# Patient Record
Sex: Male | Born: 1960 | Race: Black or African American | Hispanic: No | Marital: Single | Smoking: Current every day smoker
Health system: Southern US, Community
[De-identification: ages and names within clinical notes are randomized; demographics above are authoritative.]

## PROBLEM LIST (undated history)

## (undated) DIAGNOSIS — E785 Hyperlipidemia, unspecified: Secondary | ICD-10-CM

## (undated) DIAGNOSIS — I509 Heart failure, unspecified: Secondary | ICD-10-CM

## (undated) DIAGNOSIS — Z720 Tobacco use: Secondary | ICD-10-CM

## (undated) DIAGNOSIS — G4733 Obstructive sleep apnea (adult) (pediatric): Secondary | ICD-10-CM

## (undated) DIAGNOSIS — Z95810 Presence of automatic (implantable) cardiac defibrillator: Secondary | ICD-10-CM

## (undated) DIAGNOSIS — E669 Obesity, unspecified: Secondary | ICD-10-CM

## (undated) DIAGNOSIS — I340 Nonrheumatic mitral (valve) insufficiency: Secondary | ICD-10-CM

## (undated) DIAGNOSIS — I428 Other cardiomyopathies: Secondary | ICD-10-CM

## (undated) DIAGNOSIS — I10 Essential (primary) hypertension: Secondary | ICD-10-CM

## (undated) HISTORY — DX: Presence of automatic (implantable) cardiac defibrillator: Z95.810

## (undated) HISTORY — DX: Other cardiomyopathies: I42.8

## (undated) HISTORY — DX: Essential (primary) hypertension: I10

## (undated) HISTORY — DX: Obesity, unspecified: E66.9

## (undated) HISTORY — DX: Obstructive sleep apnea (adult) (pediatric): G47.33

## (undated) HISTORY — DX: Nonrheumatic mitral (valve) insufficiency: I34.0

## (undated) HISTORY — PX: CARDIAC DEFIBRILLATOR PLACEMENT: SHX171

## (undated) HISTORY — DX: Hyperlipidemia, unspecified: E78.5

## (undated) HISTORY — DX: Tobacco use: Z72.0

---

## 2000-10-20 ENCOUNTER — Emergency Department (HOSPITAL_COMMUNITY): Admission: EM | Admit: 2000-10-20 | Discharge: 2000-10-20 | Payer: Self-pay | Admitting: Emergency Medicine

## 2002-12-10 ENCOUNTER — Encounter: Payer: Self-pay | Admitting: Emergency Medicine

## 2002-12-11 ENCOUNTER — Encounter (INDEPENDENT_AMBULATORY_CARE_PROVIDER_SITE_OTHER): Payer: Self-pay | Admitting: Cardiology

## 2002-12-11 ENCOUNTER — Inpatient Hospital Stay (HOSPITAL_COMMUNITY): Admission: EM | Admit: 2002-12-11 | Discharge: 2002-12-15 | Payer: Self-pay | Admitting: Emergency Medicine

## 2004-01-07 ENCOUNTER — Emergency Department (HOSPITAL_COMMUNITY): Admission: EM | Admit: 2004-01-07 | Discharge: 2004-01-07 | Payer: Self-pay | Admitting: Emergency Medicine

## 2004-08-14 ENCOUNTER — Ambulatory Visit: Payer: Self-pay | Admitting: Internal Medicine

## 2004-08-24 ENCOUNTER — Emergency Department (HOSPITAL_COMMUNITY): Admission: EM | Admit: 2004-08-24 | Discharge: 2004-08-24 | Payer: Self-pay | Admitting: *Deleted

## 2004-08-24 ENCOUNTER — Ambulatory Visit: Payer: Self-pay | Admitting: Internal Medicine

## 2004-09-11 ENCOUNTER — Ambulatory Visit: Payer: Self-pay | Admitting: *Deleted

## 2004-10-06 ENCOUNTER — Ambulatory Visit: Payer: Self-pay | Admitting: Internal Medicine

## 2004-10-20 ENCOUNTER — Ambulatory Visit: Payer: Self-pay | Admitting: Cardiology

## 2004-10-27 ENCOUNTER — Ambulatory Visit: Payer: Self-pay | Admitting: Internal Medicine

## 2004-10-27 ENCOUNTER — Inpatient Hospital Stay (HOSPITAL_COMMUNITY): Admission: RE | Admit: 2004-10-27 | Discharge: 2004-10-28 | Payer: Self-pay | Admitting: Internal Medicine

## 2004-11-10 ENCOUNTER — Ambulatory Visit: Payer: Self-pay | Admitting: Internal Medicine

## 2004-11-11 ENCOUNTER — Ambulatory Visit: Payer: Self-pay | Admitting: Internal Medicine

## 2004-11-11 ENCOUNTER — Ambulatory Visit: Payer: Self-pay

## 2004-11-17 ENCOUNTER — Ambulatory Visit: Payer: Self-pay | Admitting: Pulmonary Disease

## 2004-11-17 ENCOUNTER — Ambulatory Visit (HOSPITAL_BASED_OUTPATIENT_CLINIC_OR_DEPARTMENT_OTHER): Admission: RE | Admit: 2004-11-17 | Discharge: 2004-11-17 | Payer: Self-pay | Admitting: Internal Medicine

## 2005-01-21 ENCOUNTER — Ambulatory Visit: Payer: Self-pay | Admitting: Pulmonary Disease

## 2005-02-10 ENCOUNTER — Ambulatory Visit: Payer: Self-pay | Admitting: Internal Medicine

## 2005-06-24 ENCOUNTER — Ambulatory Visit: Payer: Self-pay | Admitting: Internal Medicine

## 2005-12-24 ENCOUNTER — Ambulatory Visit: Payer: Self-pay | Admitting: Internal Medicine

## 2006-01-05 ENCOUNTER — Ambulatory Visit: Payer: Self-pay | Admitting: Internal Medicine

## 2006-01-11 ENCOUNTER — Ambulatory Visit: Payer: Self-pay | Admitting: Internal Medicine

## 2006-09-13 ENCOUNTER — Ambulatory Visit: Payer: Self-pay | Admitting: Internal Medicine

## 2007-09-11 ENCOUNTER — Emergency Department (HOSPITAL_COMMUNITY): Admission: EM | Admit: 2007-09-11 | Discharge: 2007-09-11 | Payer: Self-pay | Admitting: Emergency Medicine

## 2007-09-12 ENCOUNTER — Observation Stay (HOSPITAL_COMMUNITY): Admission: EM | Admit: 2007-09-12 | Discharge: 2007-09-13 | Payer: Self-pay | Admitting: Emergency Medicine

## 2007-09-12 ENCOUNTER — Ambulatory Visit: Payer: Self-pay | Admitting: Cardiology

## 2007-09-12 ENCOUNTER — Ambulatory Visit: Payer: Self-pay | Admitting: Pulmonary Disease

## 2007-09-20 ENCOUNTER — Ambulatory Visit: Payer: Self-pay

## 2007-10-16 ENCOUNTER — Telehealth: Payer: Self-pay | Admitting: Internal Medicine

## 2008-03-06 ENCOUNTER — Ambulatory Visit: Payer: Self-pay | Admitting: Internal Medicine

## 2008-03-06 LAB — CONVERTED CEMR LAB
Calcium: 9.2 mg/dL (ref 8.4–10.5)
Creatinine, Ser: 1.1 mg/dL (ref 0.4–1.5)
Digitoxin Lvl: 1.2 ng/mL (ref 0.8–2.0)
Potassium: 4.4 meq/L (ref 3.5–5.1)

## 2008-06-19 ENCOUNTER — Ambulatory Visit: Payer: Self-pay

## 2008-06-19 ENCOUNTER — Ambulatory Visit: Payer: Self-pay | Admitting: Cardiology

## 2008-07-01 ENCOUNTER — Encounter: Payer: Self-pay | Admitting: Internal Medicine

## 2008-12-12 ENCOUNTER — Ambulatory Visit: Payer: Self-pay

## 2009-02-20 ENCOUNTER — Encounter: Payer: Self-pay | Admitting: Internal Medicine

## 2009-05-07 ENCOUNTER — Encounter (INDEPENDENT_AMBULATORY_CARE_PROVIDER_SITE_OTHER): Payer: Self-pay | Admitting: *Deleted

## 2009-08-12 ENCOUNTER — Ambulatory Visit: Payer: Self-pay | Admitting: Internal Medicine

## 2009-08-12 DIAGNOSIS — I5022 Chronic systolic (congestive) heart failure: Secondary | ICD-10-CM

## 2009-08-12 DIAGNOSIS — I420 Dilated cardiomyopathy: Secondary | ICD-10-CM | POA: Insufficient documentation

## 2009-08-12 DIAGNOSIS — G4733 Obstructive sleep apnea (adult) (pediatric): Secondary | ICD-10-CM

## 2009-08-12 DIAGNOSIS — I428 Other cardiomyopathies: Secondary | ICD-10-CM

## 2009-09-02 ENCOUNTER — Encounter: Payer: Self-pay | Admitting: Pulmonary Disease

## 2009-09-02 ENCOUNTER — Encounter: Payer: Self-pay | Admitting: Internal Medicine

## 2009-11-25 ENCOUNTER — Telehealth (INDEPENDENT_AMBULATORY_CARE_PROVIDER_SITE_OTHER): Payer: Self-pay | Admitting: *Deleted

## 2009-12-09 ENCOUNTER — Ambulatory Visit: Payer: Self-pay | Admitting: Internal Medicine

## 2009-12-12 ENCOUNTER — Ambulatory Visit: Payer: Self-pay | Admitting: Pulmonary Disease

## 2010-04-07 ENCOUNTER — Ambulatory Visit: Payer: Self-pay | Admitting: Pulmonary Disease

## 2010-04-14 ENCOUNTER — Ambulatory Visit: Payer: Self-pay | Admitting: Internal Medicine

## 2010-04-28 ENCOUNTER — Ambulatory Visit: Payer: Self-pay | Admitting: Internal Medicine

## 2010-05-08 ENCOUNTER — Encounter: Payer: Self-pay | Admitting: Internal Medicine

## 2010-05-08 LAB — CONVERTED CEMR LAB
Creatinine, Ser: 1.1 mg/dL (ref 0.4–1.5)
GFR calc non Af Amer: 92.82 mL/min (ref >60)
Sodium: 142 meq/L (ref 135–145)

## 2010-05-30 ENCOUNTER — Encounter: Payer: Self-pay | Admitting: Pulmonary Disease

## 2010-07-28 ENCOUNTER — Ambulatory Visit: Payer: Self-pay | Admitting: Internal Medicine

## 2010-07-30 ENCOUNTER — Encounter: Payer: Self-pay | Admitting: Internal Medicine

## 2010-07-31 ENCOUNTER — Ambulatory Visit: Payer: Self-pay | Admitting: Internal Medicine

## 2010-08-03 LAB — CONVERTED CEMR LAB
BUN: 13 mg/dL (ref 6–23)
Chloride: 106 meq/L (ref 96–112)
Glucose, Bld: 105 mg/dL — ABNORMAL HIGH (ref 70–99)
Potassium: 4.6 meq/L (ref 3.5–5.1)
Prothrombin Time: 11.5 s (ref 9.7–11.8)
Sodium: 140 meq/L (ref 135–145)
aPTT: 28.3 s (ref 21.7–28.8)

## 2010-08-05 ENCOUNTER — Inpatient Hospital Stay (HOSPITAL_COMMUNITY): Admission: RE | Admit: 2010-08-05 | Discharge: 2010-08-07 | Payer: Self-pay | Admitting: Internal Medicine

## 2010-08-05 ENCOUNTER — Ambulatory Visit: Payer: Self-pay | Admitting: Internal Medicine

## 2010-08-06 ENCOUNTER — Encounter: Payer: Self-pay | Admitting: Internal Medicine

## 2010-08-07 ENCOUNTER — Encounter: Payer: Self-pay | Admitting: Internal Medicine

## 2010-08-18 ENCOUNTER — Ambulatory Visit: Payer: Self-pay

## 2010-08-18 ENCOUNTER — Encounter: Payer: Self-pay | Admitting: Internal Medicine

## 2010-10-06 ENCOUNTER — Telehealth: Payer: Self-pay | Admitting: Pulmonary Disease

## 2010-11-03 ENCOUNTER — Ambulatory Visit: Payer: Self-pay | Admitting: Internal Medicine

## 2011-01-05 NOTE — Miscellaneous (Signed)
Summary: optimal pressure 15cm, poor compliance   Clinical Lists Changes  Orders: Added new Referral order of DME Referral (DME) - Signed auto shows optimal pressure of 15cm, some leak, and only 50% compliance more than 4 hours.

## 2011-01-05 NOTE — Progress Notes (Signed)
Summary: nos appt  Phone Note Call from Patient   Caller: [email or web address]  Call For: clance Summary of Call: LMTCB x2 to rsc nos from 10/31. Initial call taken by: Darletta Moll,  October 06, 2010 3:32 PM

## 2011-01-05 NOTE — Assessment & Plan Note (Signed)
Summary: rs 05-25-10 visit/mt  Medications Added NASONEX 50 MCG/ACT  SUSP (MOMETASONE FUROATE) Two puffs each nostril daily-prn      Allergies Added: NKDA  Referring Provider:  Sherryl Manges Primary Provider:  No PCP at this time  CC:  device check.  History of Present Illness: Mr. Bitter is seen today in followup for nonischemic cardiomyopathycongestive heart failure and left bundle branch block, status post CRT-D implantation in November 2005.   Marland Kitchen He has reached ERI.  The patient denies  , chest pain, edema or palpitations;  shortness of breath is stable  His major complaint is pain in his left heel which has been there for about 2 months or so.  He is tolerating his medications well. He has gotten his Medicaid card straightened out and will be able to come back and see [place] regularly.      Current Medications (verified): 1)  Spironolactone 50 Mg Tabs (Spironolactone) .... Take 1/2 Tablet By Mouth Once A Day 2)  Coreg 25 Mg Tabs (Carvedilol) .... Take 1 Tab Two Times A Day 3)  Aspirin Adult Low Strength 81 Mg Tbec (Aspirin) .... Take 1 Tablet By Mouth Once A Day 4)  Fosinopril Sodium 20 Mg Tabs (Fosinopril Sodium) .Marland Kitchen.. 1 Tablet By Mouth Once Daily 5)  Nasonex 50 Mcg/act  Susp (Mometasone Furoate) .... Two Puffs Each Nostril Daily-Prn 6)  Furosemide 20 Mg Tabs (Furosemide) .... Take Two  Tablets By Mouth Daily.  Allergies (verified): No Known Drug Allergies  Past History:  Past Medical History: Last updated: 03/04/2009  1. Hypertension.   2. Nonischemic cardiomyopathy.   3. Mitral regurgitation.   4. Hyperlipidemia.   5. He is status post AICD placement.   6. Tobacco abuse.   7. Severe obstructive sleep apnea.  Obesity.  Tobacco abuse.   Past Surgical History: Last updated: 03/04/2009 Implantation of cardiac defibrillator per Dr. Sherryl Manges, was a Guidant Contact H177 device.   Family History: Last updated: 12/12/2009 Mother is in good health.  Father died from   complications of hypertensive cardiomyopathy.  2 sisters and 1 brother with heart disease.   Social History: Last updated: 12/12/2009  Disabled--lives with mother Single  Tobacco Use - Yes. - started at age 22.  5 to 10 cigs per day. Alcohol Use - yes--socially Regular Exercise - no Drug Use - yes--occasional marijuana use  Vital Signs:  Patient profile:   50 year old male Height:      71 inches Weight:      263 pounds BMI:     36.81 Pulse rate:   74 / minute Pulse rhythm:   regular BP sitting:   110 / 66  (left arm) Cuff size:   large  Vitals Entered By: Judithe Modest CMA (July 28, 2010 10:50 AM)  Physical Exam  General:  The patient was alert and oriented in no acute distress. HEENT Normal.  Neck veins were flat, carotids were brisk.  Lungs were clear.  Heart sounds were regular without murmurs or gallops.  Abdomen was soft with active bowel sounds. There is no clubbing cyanosis or edema. Skin Warm and dry tenderness on the base of left heel    ICD Specifications Following MD:  Sherryl Manges, MD     ICD Vendor:  [facility] [employer]     ICD Model Number:  H177     ICD Serial Number:  [id number] ICD DOI:  10/27/2004     ICD Implanting MD:  Sherryl Manges, MD  Lead 1:  Location: RA     DOI: 10/27/2004     Model #: [id number]     Serial #: [id number]     Status: active Lead 2:    Location: RV     DOI: 10/27/2004     Model #: 8657     Serial #: [id number]     Status: active Lead 3:    Location: LV     DOI: 10/27/2004     Model #: 9528     Serial #: [id number]     Status: active  Indications::  NICM,CHF   ICD Follow Up Remote Check?  No Battery Voltage:  2.41 V     Charge Time:  17.2 seconds     Battery Est. Longevity:  ERI Underlying rhythm:  SR ICD Dependent:  No       ICD Device Measurements Atrium:  Impedance: 436 ohms, Threshold: 1.0 V at 0.4 msec Right Ventricle:  Impedance: 506 ohms, Threshold: 0.8 V at 0.4 msec Left Ventricle:  Impedance: 595 ohms, Threshold: 0.6 V at  0.4 msec Shock Impedance: 44 ohms   Episodes MS Episodes:  3     Percent Mode Switch:  0%     Coumadin:  No Shock:  0     ATP:  0     Nonsustained:  0     Atrial Pacing:  8%     Ventricular Pacing:  96%  Brady Parameters Mode DDD     Lower Rate Limit:  60     Upper Rate Limit 120  Tachy Zones VF:  240     VT:  210     VT1:  190     Tech Comments:  Device reached ERI 05/01/10.  Scheduled for change out. Altha Harm, LPN  July 28, 2010 11:08 AM   Impression & Recommendations:  Problem # 1:  IMPLANTABLE  DEFIBRILLATOR CRT-GDT (ICD-V45.02) Device parameters and data were reviewed and no changes were made  device has reached ERI. You'll need generator replacement. We have reviewed the potential benefits as well as risks including but not limited to lead fracture and infection. He understands these risks and is willing to proceed.  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 We'll obtain a metabolic profile His updated medication list for this problem includes:    Spironolactone 50 Mg Tabs (Spironolactone) .Marland Kitchen... Take 1/2 tablet by mouth once a day    Coreg 25 Mg Tabs (Carvedilol) .Marland Kitchen... Take 1 tab two times a day    Aspirin Adult Low Strength 81 Mg Tbec (Aspirin) .Marland Kitchen... Take 1 tablet by mouth once a day    Fosinopril Sodium 20 Mg Tabs (Fosinopril sodium) .Marland Kitchen... 1 tablet by mouth once daily    Furosemide 20 Mg Tabs (Furosemide) .Marland Kitchen... Take two  tablets by mouth daily.  Problem # 3:  SYSTOLIC HEART FAILURE, CHRONIC (ICD-428.22) again stable on current medications His updated medication list for this problem includes:    Spironolactone 50 Mg Tabs (Spironolactone) .Marland Kitchen... Take 1/2 tablet by mouth once a day    Coreg 25 Mg Tabs (Carvedilol) .Marland Kitchen... Take 1 tab two times a day    Aspirin Adult Low Strength 81 Mg Tbec (Aspirin) .Marland Kitchen... Take 1 tablet by mouth once a day    Fosinopril Sodium 20 Mg Tabs (Fosinopril sodium) .Marland Kitchen... 1 tablet by mouth once daily     Furosemide 20 Mg Tabs (Furosemide) .Marland Kitchen... Take two  tablets by mouth daily.  Problem # 4:  HEEL PAIN, LEFT (ICD-729.5) I wonder whether he is bruised his calcaneus. I suggested Naprosyn for about 2 weeks as well as a heel pad fitted to the shoe. Neither the piece are not sufficient, he is encouraged to see his primary care physician or a podiatrist.

## 2011-01-05 NOTE — Assessment & Plan Note (Signed)
Summary: defib check.[email or web address].amber      Allergies Added: NKDA  Visit Type:  ICD-[employer] Referring Provider:  Sherryl Manges Primary Provider:  No PCP at this time  CC:  no complaints.  History of Present Illness: Spencer Robertson is seen today in followup for nonischemic cardiomyopathycongestive heart failure and left bundle branch block, status post CRT-D implantation originally  in November 2005 please change out August 2011  The patient denies  , chest pain, edema or palpitations;  shortness of breath is stable  His major complaint is pain in his left heel which has been there for about5 months or so  He is tolerating his medications well. He has gotten his Medicaid card straightened out and will be able to come back and see [place] regularly.     Problems Prior to Update: 1)  Obstructive Sleep Apnea  (ICD-327.23) 2)  Implantable Defibrillator Crt-gdt  (ICD-V45.02) 3)  Cardiomyopathy, Primary, Dilated  (ICD-425.4) 4)  Systolic Heart Failure, Chronic  (ICD-428.22) 5)  Sleep Apnea  (ICD-780.57)  Current Medications (verified): 1)  Spironolactone 50 Mg Tabs (Spironolactone) .... Take 1/2 Tablet By Mouth Once A Day 2)  Coreg 25 Mg Tabs (Carvedilol) .... Take 1 Tab Two Times A Day 3)  Aspirin Adult Low Strength 81 Mg Tbec (Aspirin) .... Take 1 Tablet By Mouth Once A Day 4)  Fosinopril Sodium 20 Mg Tabs (Fosinopril Sodium) .Marland Kitchen.. 1 Tablet By Mouth Once Daily 5)  Nasonex 50 Mcg/act  Susp (Mometasone Furoate) .... Two Puffs Each Nostril Daily-Prn 6)  Furosemide 20 Mg Tabs (Furosemide) .... Take Two  Tablets By Mouth Daily.  Allergies (verified): No Known Drug Allergies  Past History:  Past Medical History: Last updated: 03/04/2009  1. Hypertension.   2. Nonischemic cardiomyopathy.   3. Mitral regurgitation.   4. Hyperlipidemia.   5. He is status post AICD placement.   6. Tobacco abuse.   7. Severe obstructive sleep apnea.  Obesity.  Tobacco abuse.   Past Surgical History: Last  updated: 03/04/2009 Implantation of cardiac defibrillator per Dr. Sherryl Manges, was a Guidant Contact H177 device.   Family History: Last updated: 12/12/2009 Mother is in good health.  Father died from  complications of hypertensive cardiomyopathy.  2 sisters and 1 brother with heart disease.   Social History: Last updated: 12/12/2009  Disabled--lives with mother Single  Tobacco Use - Yes. - started at age 63.  5 to 10 cigs per day. Alcohol Use - yes--socially Regular Exercise - no Drug Use - yes--occasional marijuana use  Risk Factors: Exercise: no (03/04/2009)  Risk Factors: Smoking Status: current (03/04/2009)  Vital Signs:  Patient profile:   50 year old male Height:      71 inches Weight:      267 pounds BMI:     37.37 Pulse rate:   62 / minute BP sitting:   130 / 84  (right arm) Cuff size:   regular  Vitals Entered By: Caralee Ates CMA (November 03, 2010 10:08 AM)  Physical Exam  General:  The patient was alert and oriented in no acute distress. HEENT Normal.  Neck veins were flat, carotids were brisk.  Lungs were clear.  Heart sounds were regular without murmurs or gallops.  Abdomen was soft with active bowel sounds. There is no clubbing cyanosis or edema. Skin Warm and dry     ICD Specifications Following MD:  Sherryl Manges, MD     ICD Vendor:  [facility] [employer]     ICD Model Number:  n119     ICD Serial Number:  [id number] ICD DOI:  08/05/2010     ICD Implanting MD:  Sherryl Manges, MD  Lead 1:    Location: RA     DOI: 10/27/2004     Model #: 5573     Serial #: [id number]     Status: active Lead 2:    Location: RV     DOI: 10/27/2004     Model #: 6237     Serial #: [id number]     Status: active Lead 3:    Location: LV     DOI: 10/27/2004     Model #: 4194     Serial #: [id number]     Status: active  Indications::  NICM,CHF  Explantation Comments: 08/05/10 [employer] [id number] explanted  ICD Follow Up Remote Check?  No Battery Est. Longevity:   8.1 Underlying rhythm:  SR ICD Dependent:  No       ICD Device Measurements Atrium:  Amplitude: 5.2 mV, Impedance: 534 ohms, Threshold: 1.0 V at 0.4 msec Right Ventricle:  Amplitude: 13.8 mV, Impedance: 548 ohms, Threshold: 0.7 V at 0.4 msec Left Ventricle:  Amplitude: 19 mV, Impedance: 644 ohms, Threshold: 0.6 V at 0.4 msec Shock Impedance: 64 ohms   Episodes MS Episodes:  0     Coumadin:  No Shock:  0     ATP:  1     Atrial Pacing:  12%     Ventricular Pacing:  98%  Brady Parameters Mode DDD     Lower Rate Limit:  60     Upper Rate Limit 120  Tachy Zones VF:  240     VT:  210     VT1:  190     Next Cardiology Appt Due:  02/04/2011 Tech Comments:  No parameter  changes.  Device function normal.  Checked by industry. No Latitude @ this time.  ROV 3 months clinic. 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 Take 1/2 tablet by mouth once a day    Coreg 25 Mg Tabs (Carvedilol) .Marland Kitchen... Take 1 tab two times a day    Aspirin Adult Low Strength 81 Mg Tbec (Aspirin) .Marland Kitchen... Take 1 tablet by mouth once a day    Fosinopril Sodium 20 Mg Tabs (Fosinopril sodium) .Marland Kitchen... 1 tablet by mouth once daily    Furosemide 20 Mg Tabs (Furosemide) .Marland Kitchen... Take two  tablets by mouth daily.  His updated medication list for this problem includes:    Spironolactone 50 Mg Tabs (Spironolactone) .Marland Kitchen... Take 1/2 tablet by mouth once a day    Coreg 25 Mg Tabs (Carvedilol) .Marland Kitchen... Take 1 tab two times a day    Aspirin Adult Low Strength 81 Mg Tbec (Aspirin) .Marland Kitchen... Take 1 tablet by mouth once a day    Fosinopril Sodium 20 Mg Tabs (Fosinopril sodium) .Marland Kitchen... 1 tablet by mouth once daily     Furosemide 20 Mg Tabs (Furosemide) .Marland Kitchen... Take two  tablets by mouth daily.  Problem # 3:  SYSTOLIC HEART FAILURE, CHRONIC (ICD-428.22)  stable His updated medication list for this problem includes:    Spironolactone 50 Mg Tabs (Spironolactone) .Marland Kitchen... Take 1/2  tablet by mouth once a day    Coreg 25 Mg Tabs (Carvedilol) .Marland Kitchen... Take 1 tab two times a day    Aspirin Adult Low Strength 81 Mg Tbec (Aspirin) .Marland Kitchen... Take 1 tablet by mouth once a day    Fosinopril Sodium 20 Mg Tabs (Fosinopril sodium) .Marland Kitchen... 1 tablet by mouth once daily    Furosemide 20 Mg Tabs (Furosemide) .Marland Kitchen... Take two  tablets by mouth daily.  His updated medication list for this problem includes:    Spironolactone 50 Mg Tabs (Spironolactone) .Marland Kitchen... Take 1/2 tablet by mouth once a day    Coreg 25 Mg Tabs (Carvedilol) .Marland Kitchen... Take 1 tab two times a day    Aspirin Adult Low Strength 81 Mg Tbec (Aspirin) .Marland Kitchen... Take 1 tablet by mouth once a day    Fosinopril Sodium 20 Mg Tabs (Fosinopril sodium) .Marland Kitchen... 1 tablet by mouth once daily    Furosemide 20 Mg Tabs (Furosemide) .Marland Kitchen... Take two  tablets by mouth daily.  Patient Instructions: 1)  Your physician recommends that you schedule a follow-up appointment in: 3 months with pacer clinic 2)  Your physician recommends that you continue on your current medications as directed. Please refer to the Current Medication list given to you today. 3)  Your physician wants you to follow-up in:1 year with Dr. Logan Bores will receive a reminder letter in the mail two months in advance. If you don't receive a letter, please call our office to schedule the follow-up appointment.

## 2011-01-05 NOTE — Cardiovascular Report (Signed)
Summary: CRT-D Warranty Validation and Lead Registration Form   CRT-D Warranty Validation and Lead Registration Form   Imported By: Roderic Ovens 08/31/2010 16:16:49  _____________________________________________________________________  External Attachment:    Type:   Image     Comment:   External Document

## 2011-01-05 NOTE — Assessment & Plan Note (Signed)
Summary: consult for osa   Copy to:  Sherryl Manges  CC:  Sleep Consult.  History of Present Illness: The pt is a 50y/o male who I have been asked to see for osa.  He was diagnosed with severe osa in 2005, with AHI of 49/hr and desat to 56%.  He was titrated to an optimal cpap pressure of 18cm at that time.  He was treated with cpap, but did not feel that it helped him.  He noted a lot of nasal congestion with it, but it should be noted that he did not have a heated humidifier at the time.  He has been off cpap for over a year.  He has had a recent home screening study, which showed an AHI of 59/hr with desat to 78%.  The pt still is having snoring as well as pauses in his breathing during sleep.  He also admits to an occasional choking arousal.  He typically goes to bed btw 9-11pm, and arises at 6:30am to start his day.  He thinks he feels rested upon arousal, however admits to EDS with periods of inactivity.  He will doze in the evening with tv, but denies any issues with driving.  He states that his weight is down 20 pounds over the last 2 years, and his epworth score is 3.  Current Medications (verified): 1)  Spironolactone 50 Mg Tabs (Spironolactone) .... Take 1/2 Tablet By Mouth Once A Day 2)  Coreg 25 Mg Tabs (Carvedilol) .... Take 1 Tab Two Times A Day 3)  Aspirin Adult Low Strength 81 Mg Tbec (Aspirin) .... Take 1 Tablet By Mouth Once A Day 4)  Fosinopril Sodium 20 Mg Tabs (Fosinopril Sodium) .Marland Kitchen.. 1 Tablet By Mouth Once Daily  Allergies (verified): No Known Drug Allergies  Past History:  Past Medical History: Reviewed history from 03/04/2009 and no changes required.  1. Hypertension.   2. Nonischemic cardiomyopathy.   3. Mitral regurgitation.   4. Hyperlipidemia.   5. He is status post AICD placement.   6. Tobacco abuse.   7. Severe obstructive sleep apnea.  Obesity.  Tobacco abuse.   Past Surgical History: Reviewed history from 03/04/2009 and no changes  required. Implantation of cardiac defibrillator per Dr. Sherryl Manges, was a Guidant Contact H177 device.   Family History: Reviewed history from 03/04/2009 and no changes required. Mother is in good health.  Father died from  complications of hypertensive cardiomyopathy.  2 sisters and 1 brother with heart disease.   Social History: Reviewed history from 03/04/2009 and no changes required.  Disabled--lives with mother Single  Tobacco Use - Yes. - started at age 15.  5 to 10 cigs per day. Alcohol Use - yes--socially Regular Exercise - no Drug Use - yes--occasional marijuana use  Review of Systems       The patient complains of shortness of breath with activity.  The patient denies shortness of breath at rest, productive cough, non-productive cough, coughing up blood, chest pain, irregular heartbeats, acid heartburn, indigestion, loss of appetite, weight change, abdominal pain, difficulty swallowing, sore throat, tooth/dental problems, headaches, nasal congestion/difficulty breathing through nose, sneezing, itching, ear ache, anxiety, depression, hand/feet swelling, joint stiffness or pain, rash, change in color of mucus, and fever.    Vital Signs:  Patient profile:   50 year old male Height:      69 inches Weight:      263.50 pounds BMI:     39.05 O2 Sat:      95 %  on Room air Temp:     98.2 degrees F oral Pulse rate:   90 / minute BP sitting:   104 / 76  (right arm) Cuff size:   large  Vitals Entered By: Arman Filter LPN (December 12, 2009 10:08 AM)  O2 Flow:  Room air CC: Sleep Consult Comments Medications reviewed with patient Arman Filter LPN  December 12, 2009 10:08 AM    Physical Exam  General:  obese male in nad Eyes:  PERRLA and EOMI.   Nose:  deviated septum to left with narrowing Mouth:  elongation of soft palate and uvula significant soft tissue redundancy with narrowing posteriorly Neck:  no jvd, tmg, LN Lungs:  clear to auscultation Heart:  rrr, 3/6  blowing systolic murmur Abdomen:  centripetal obesity, bs+ Extremities:  minimal edema, no cyanosis pulses intact distally Neurologic:  alert and oriented,moves all 4.   Impression & Recommendations:  Problem # 1:  OBSTRUCTIVE SLEEP APNEA (ICD-327.23) the pt has severe osa which needs aggressive treatment given his underlying cardiac disease.  I suspect the pt is more symptomatic than he realizes given the severity of his disease.  I have reviewed the various treatment options for osa, but have explained that cpap coupled with weight loss represents his best chance of success.  The pt is willing to try again.  Medications Added to Medication List This Visit: 1)  Aspirin Adult Low Strength 81 Mg Tbec (Aspirin) .... Take 1 tablet by mouth once a day  Other Orders: Consultation Level IV (10272) DME Referral (DME)  Patient Instructions: 1)  will restart cpap, and optimize pressure for you. 2)  please call me if any issues with tolerance. 3)  work on weight loss 4)  followup with me in 6weeks.

## 2011-01-05 NOTE — Miscellaneous (Signed)
  Clinical Lists Changes  Medications: Changed medication from FUROSEMIDE 20 MG TABS (FUROSEMIDE) Take one tablet by mouth daily. to FUROSEMIDE 20 MG TABS (FUROSEMIDE) Take two  tablets by mouth daily. - Signed Rx of FUROSEMIDE 20 MG TABS (FUROSEMIDE) Take two  tablets by mouth daily.;  #60 x 6;  Signed;  Entered by: Layne Benton, RN, BSN;  Authorized by: Nathen May, MD, [facility];  Method used: Electronically to [facility] [address] [phone]*, [address], [place], [place]  [address], Ph: [phone], Fax: [phone]    Prescriptions: FUROSEMIDE 20 MG TABS (FUROSEMIDE) Take two  tablets by mouth daily.  #60 x 6   Entered by:   Layne Benton, RN, BSN   Authorized by:   Nathen May, MD, [facility]   Signed by:   Layne Benton, RN, BSN on 05/08/2010   Method used:   Electronically to        [facility] [address] [phone]* (retail)       [address]       [place], [place]  [address]       Ph: [phone]       Fax: [phone]   RxID:   [id number]

## 2011-01-05 NOTE — Procedures (Signed)
Summary: wound check.bsx.amber      Allergies Added: NKDA  Current Medications (verified): 1)  Spironolactone 50 Mg Tabs (Spironolactone) .... Take 1/2 Tablet By Mouth Once A Day 2)  Coreg 25 Mg Tabs (Carvedilol) .... Take 1 Tab Two Times A Day 3)  Aspirin Adult Low Strength 81 Mg Tbec (Aspirin) .... Take 1 Tablet By Mouth Once A Day 4)  Fosinopril Sodium 20 Mg Tabs (Fosinopril Sodium) .Marland Kitchen.. 1 Tablet By Mouth Once Daily 5)  Nasonex 50 Mcg/act  Susp (Mometasone Furoate) .... Two Puffs Each Nostril Daily-Prn 6)  Furosemide 20 Mg Tabs (Furosemide) .... Take Two  Tablets By Mouth Daily.  Allergies (verified): No Known Drug Allergies   ICD Specifications Following MD:  Sherryl Manges, MD     ICD Vendor:  [facility] [employer]     ICD Model Number:  n119     ICD Serial Number:  [id number] ICD DOI:  08/05/2010     ICD Implanting MD:  Sherryl Manges, MD  Lead 1:    Location: RA     DOI: 10/27/2004     Model #: 4782     Serial #: [id number]     Status: active Lead 2:    Location: RV     DOI: 10/27/2004     Model #: 5784     Serial #: [id number]     Status: active Lead 3:    Location: LV     DOI: 10/27/2004     Model #: 4194     Serial #: [id number]     Status: active  Indications::  NICM,CHF  Explantation Comments: 08/05/10 [employer] [id number] explanted  ICD Follow Up Remote Check?  No Battery Voltage:  BOL V     Charge Time:  8.1 seconds     Battery Est. Longevity:  6.5 years Underlying rhythm:  SR ICD Dependent:  No       ICD Device Measurements Atrium:  Amplitude: 4.1 mV, Impedance: 556 ohms, Threshold: 1.0 V at 0.4 msec Right Ventricle:  Amplitude: 14.0 mV, Impedance: 545 ohms, Threshold: 0.8 V at 0.4 msec Left Ventricle:  Amplitude: 15.3 mV, Impedance: 645 ohms, Threshold: 0.7 V at 0.4 msec Shock Impedance: 57 ohms   Episodes MS Episodes:  1     Coumadin:  No Shock:  0     ATP:  0     Nonsustained:  0     Atrial Pacing:  39%     Ventricular Pacing:  99%  Brady Parameters Mode DDD      Lower Rate Limit:  60     Upper Rate Limit 120  Tachy Zones VF:  240     VT:  210     VT1:  190     Tech Comments:  Steri strips removed, no redness and minimal edema noted.   No parameter changes.  ROV 11/29 with Dr. Graciela Husbands. Spencer Harm, LPN  August 18, 2010 3:13 PM

## 2011-01-05 NOTE — Cardiovascular Report (Signed)
Summary: Office Visit   Office Visit   Imported By: Roderic Ovens 07/29/2010 15:23:38  _____________________________________________________________________  External Attachment:    Type:   Image     Comment:   External Document

## 2011-01-05 NOTE — Cardiovascular Report (Signed)
Summary: Pre Op Orders   Pre Op Orders   Imported By: Roderic Ovens 08/18/2010 15:11:00  _____________________________________________________________________  External Attachment:    Type:   Image     Comment:   External Document

## 2011-01-05 NOTE — Miscellaneous (Signed)
Summary: Device change out  Clinical Lists Changes  Observations: Added new observation of ICD IMPL DTE: 08/05/2010 (08/07/2010 12:20) Added new observation of ICD SERL#: [id number]  (08/07/2010 12:20) Added new observation of ICD MODL#: n119  (08/07/2010 12:20) Added new observation of ICDEXPLCOMM: 08/05/10 [employer] [id number] explanted  (08/07/2010 12:20)       ICD Specifications Following MD:  Sherryl Manges, MD     ICD Vendor:  [facility] Scientific     ICD Model Number:  n119     ICD Serial Number:  [id number] ICD DOI:  08/05/2010     ICD Implanting MD:  Sherryl Manges, MD  Lead 1:    Location: RA     DOI: 10/27/2004     Model #: 8119     Serial #: [id number]     Status: active Lead 2:    Location: RV     DOI: 10/27/2004     Model #: 1308     Serial #: [id number]     Status: active Lead 3:    Location: LV     DOI: 10/27/2004     Model #: 4194     Serial #: [id number]     Status: active  Indications::  NICM,CHF  Explantation Comments: 08/05/10 [employer] [id number]/282909 explanted  ICD Follow Up ICD Dependent:  No      Episodes Coumadin:  No  Brady Parameters Mode DDD     Lower Rate Limit:  60     Upper Rate Limit 120  Tachy Zones VF:  240     VT:  210     VT1:  190

## 2011-01-05 NOTE — Assessment & Plan Note (Signed)
Summary: PC2      Allergies Added: NKDA  History of Present Illness: Mr. Kidane is seen today in followup for nonischemic cardiomyopathy, congestive heart failure and left bundle branch block, status post CRT-D implantation in November 2005.  He denies significant shortness of breath although he has persistent moderate fatigue. He denies peripheral edema. There has been no chest pain.  He has significant obstructive sleeping patterns; he has daytime somnolence falling asleep while watching TV and when just sitting around.  He is tolerating his medications well. He has gotten his Medicaid card straightened out and will be able to come back and see [place] regularly.  Current Medications (verified): 1)  Spironolactone 50 Mg Tabs (Spironolactone) .... Take 1/2 Tablet By Mouth Once A Day 2)  Coreg 25 Mg Tabs (Carvedilol) .... Take 1 Tab Two Times A Day 3)  Aspirin Adult Low Strength 81 Mg Tbec (Aspirin) 4)  Fosinopril Sodium 20 Mg Tabs (Fosinopril Sodium) .Marland Kitchen.. 1 Tablet By Mouth Once Daily  Allergies (verified): No Known Drug Allergies  Past History:  Past Medical History: Last updated: 03/04/2009  1. Hypertension.   2. Nonischemic cardiomyopathy.   3. Mitral regurgitation.   4. Hyperlipidemia.   5. He is status post AICD placement.   6. Tobacco abuse.   7. Severe obstructive sleep apnea.  Obesity.  Tobacco abuse.   Past Surgical History: Last updated: 03/04/2009 Implantation of cardiac defibrillator per Dr. Sherryl Manges, was a [employer] Contact H177 device.   Family History: Last updated: 03/04/2009 Mother is in good health.  Father died from  complications of hypertensive cardiomyopathy.  He has siblings who are  in good health.   Social History: Last updated: 03/04/2009  Disabled--lives with mother Single  Tobacco Use - Yes. --25 ppy Alcohol Use - yes--socially Regular Exercise - no Drug Use - yes--occasional marijuana use  Vital Signs:  Patient profile:   50 year old  male Height:      72 inches Weight:      255 pounds BMI:     34.71 Pulse rate:   80 / minute Pulse rhythm:   regular BP sitting:   113 / 80  (left arm) Cuff size:   large  Vitals Entered By: Judithe Modest CMA (December 09, 2009 12:43 PM)    ICD Specifications Following MD:  Sherryl Manges, MD     ICD Vendor:  [facility] [employer]     ICD Model Number:  H177     ICD Serial Number:  [id number] ICD DOI:  10/27/2004     ICD Implanting MD:  Sherryl Manges, MD  Lead 1:    Location: RA     DOI: 10/27/2004     Model #: 1610     Serial #: [id number]     Status: active Lead 2:    Location: RV     DOI: 10/27/2004     Model #: 1191     Serial #: [id number]     Status: active Lead 3:    Location: LV     DOI: 10/27/2004     Model #: 6213     Serial #: [id number]     Status: active  Indications::  NICM,CHF   ICD Follow Up ICD Dependent:  No      Episodes Coumadin:  No  Brady Parameters Mode DDD     Lower Rate Limit:  60     Upper Rate Limit 120  Tachy Zones VF:  240     VT:  210     VT1:  190

## 2011-01-05 NOTE — Consult Note (Signed)
Summary: Sleep Med Consult/[facility]  Sleep Med Consult/[facility]   Imported By: Sherian Rein 12/17/2009 10:13:34  _____________________________________________________________________  External Attachment:    Type:   Image     Comment:   External Document

## 2011-01-05 NOTE — Assessment & Plan Note (Signed)
Summary: defib check.gdt.amber  Medications Added FUROSEMIDE 20 MG TABS (FUROSEMIDE) Take one tablet by mouth daily.      Allergies Added: NKDA  Visit Type:  Pacemaker check Referring Provider:  Sherryl Robertson Primary Provider:  No PCP at this time  CC:  sob..thinks due to pollen.....  History of Present Illness: Spencer Robertson is seen today in followup for nonischemic cardiomyopathycongestive heart failure and left bundle branch block, status post CRT-D implantation in November 2005.     He has significant obstructive sleeping patterns; he has daytime somnolence falling asleep while watching TV and when just sitting around.  Since his last visit he has been fitted with CPAP although it needs to be refitted currently.  He is tolerating his medications well. He has gotten his Medicaid card straightened out and will be able to come back and see [place] regularly.  unfortunately, he's had significant problems with shortness of breath that is intermittent. He said polyp with edema and these track together. He also has some shortness of breath associated with nasal congestion which is quite separate.  Current Medications (verified): 1)  Spironolactone 50 Mg Tabs (Spironolactone) .... Take 1/2 Tablet By Mouth Once A Day 2)  Coreg 25 Mg Tabs (Carvedilol) .... Take 1 Tab Two Times A Day 3)  Aspirin Adult Low Strength 81 Mg Tbec (Aspirin) .... Take 1 Tablet By Mouth Once A Day 4)  Fosinopril Sodium 20 Mg Tabs (Fosinopril Sodium) .Marland Kitchen.. 1 Tablet By Mouth Once Daily 5)  Nasonex 50 Mcg/act  Susp (Mometasone Furoate) .... Two Puffs Each Nostril Daily  Allergies (verified): No Known Drug Allergies  Past History:  Past Medical History: Last updated: 03/04/2009  1. Hypertension.   2. Nonischemic cardiomyopathy.   3. Mitral regurgitation.   4. Hyperlipidemia.   5. He is status post AICD placement.   6. Tobacco abuse.   7. Severe obstructive sleep apnea.  Obesity.  Tobacco abuse.   Vital  Signs:  Patient profile:   50 year old male Height:      71 inches Weight:      267 pounds BMI:     37.37 Pulse rate:   72 / minute Pulse rhythm:   regular BP sitting:   100 / 68  (left arm) Cuff size:   large  Vitals Entered By: Spencer Robertson, Spencer Robertson (Robertson 10, 2011 10:38 AM)  Physical Exam  General:  The patient was alert and oriented in no acute distress. HEENT Normal.  Neck veins were  non discernible, carotids were brisk.  Lungs were clear.  Heart sounds were regular without murmurs or gallops.  Abdomen was soft with active bowel sounds. There is no clubbing cyanosis or edema. Skin Warm and dry     ICD Specifications Following Spencer Robertson:  Spencer Manges, Spencer Robertson     ICD Vendor:  [facility] Scientific     ICD Model Number:  H177     ICD Serial Number:  [id number] ICD DOI:  10/27/2004     ICD Implanting Spencer Robertson:  Spencer Manges, Spencer Robertson  Lead 1:    Location: RA     DOI: 10/27/2004     Model #: 1478     Serial #: [id number]     Status: active Lead 2:    Location: RV     DOI: 10/27/2004     Model #: 6578     Serial #: [id number]     Status: active Lead 3:    Location: LV     DOI: 10/27/2004  Model #: [id number]     Serial #: [id number]     Status: active  Indications::  NICM,CHF   ICD Follow Up Remote Check?  No Battery Voltage:  2.53 V     Charge Time:  12.5 seconds     Battery Est. Longevity:  MOL2 Underlying rhythm:  SR ICD Dependent:  No       ICD Device Measurements Atrium:  Amplitude: 4.3 mV, Impedance: 392 ohms, Threshold: 1.0 V at 0.4 msec Right Ventricle:  Amplitude: 15.1 mV, Impedance: 543 ohms, Threshold: 0.8 V at 0.4 msec Left Ventricle:  Amplitude: 13.5 mV, Impedance: 611 ohms, Threshold: 0.8 V at 0.4 msec Shock Impedance: 46 ohms   Episodes MS Episodes:  0     Percent Mode Switch:  0     Coumadin:  No Shock:  0     ATP:  0     Nonsustained:  0     Atrial Pacing:  7%     Ventricular Pacing:  95%  Brady Parameters Mode DDD     Lower Rate Limit:  60     Upper Rate Limit 120  Tachy Zones VF:  240      VT:  210     VT1:  190     Next Cardiology Appt Due:  07/06/2010 Tech Comments:  No parameter changes.  Device function normal.  Near ERI.  ROV 3 months clinic. Spencer Harm, Spencer Robertson  Robertson 10, 2011 11:16 AM   Impression & Recommendations:  Problem # 1:  SYSTOLIC HEART FAILURE, ACUTE ON CHRONIC (ICD-428.23) MKing has moderate symptoms of congestive heart failure. His medication regime suggest the opportunity to introduce a diuretic in addition to his Aldactone. We'll check him in about 2 weeks measured metabolic profile potassium and his BNP. There've been no blood work in the charts back to 2009.  His updated medication list for this problem includes:    Spironolactone 50 Mg Tabs (Spironolactone) .Marland Kitchen... Take 1/2 tablet by mouth once a day    Coreg 25 Mg Tabs (Carvedilol) .Marland Kitchen... Take 1 tab two times a day    Aspirin Adult Low Strength 81 Mg Tbec (Aspirin) .Marland Kitchen... Take 1 tablet by mouth once a day    Fosinopril Sodium 20 Mg Tabs (Fosinopril sodium) .Marland Kitchen... 1 tablet by mouth once daily    Furosemide 20 Mg Tabs (Furosemide) .Marland Kitchen... Take one tablet by mouth daily.  Problem # 2:  IMPLANTABLE  DEFIBRILLATOR CRT-GDT (ICD-V45.02) Device parameters and data were reviewed and no changes were made.    Problem # 3:  CARDIOMYOPATHY, PRIMARY, DILATED (ICD-425.4) Will add a diuretic as noted above His updated medication list for this problem includes:    Spironolactone 50 Mg Tabs (Spironolactone) .Marland Kitchen... Take 1/2 tablet by mouth once a day    Coreg 25 Mg Tabs (Carvedilol) .Marland Kitchen... Take 1 tab two times a day    Aspirin Adult Low Strength 81 Mg Tbec (Aspirin) .Marland Kitchen... Take 1 tablet by mouth once a day    Fosinopril Sodium 20 Mg Tabs (Fosinopril sodium) .Marland Kitchen... 1 tablet by mouth once daily    Furosemide 20 Mg Tabs (Furosemide) .Marland Kitchen... Take one tablet by mouth daily.  Problem # 4:  OBSTRUCTIVE SLEEP APNEA (ICD-327.23) he is treated with sleep mask again. This will be important for management of his current myopathy  Patient  Instructions: 1)  Your physician has recommended you make the following change in your medication: Start Furosemide 20mg  1 tablet daily. 2)  Your physician recommends that  you return for lab work in: 2 weeks (BMP, BNP dx 786.05, 428.23) 3)  Your physician recommends that you schedule a follow-up appointment in: 4-5 weeks with Dr Graciela Husbands. Prescriptions: FUROSEMIDE 20 MG TABS (FUROSEMIDE) Take one tablet by mouth daily.  #30 x 3   Entered by:   Optometrist BSN   Authorized by:   Spencer May, Spencer Robertson, Spencer Robertson   Signed by:   Gypsy Balsam RN BSN on 04/14/2010   Method used:   Electronically to        [facility] [phone]* (retail)       [address]       [place], [place]  [address]       Ph: [phone]       Fax: [phone]   RxID:   [id number]

## 2011-01-05 NOTE — Assessment & Plan Note (Signed)
Summary: rov for cpap issues.   Copy to:  Sherryl Manges  CC:  4 month OSA follow up.  Pt states he is 'wearing cpap everynight until it starts bothering me."  Pt states he feels like it is keeping him "stopped up" but denies problems with mask and pressure.  Marland Kitchen  History of Present Illness: the pt comes in today for f/u of his osa.  He recently had an auto download to optimize his pressure, but the download showed poor compliance and excessive mask leak.  The pt tells me that his current mask is leaking, and does not fit well.  He is also having a lot of nasal congestion, despite his heater turned up on his humidifier.  He is wearing cpap most nights, but does not keep on all night very often.    Current Medications (verified): 1)  Spironolactone 50 Mg Tabs (Spironolactone) .... Take 1/2 Tablet By Mouth Once A Day 2)  Coreg 25 Mg Tabs (Carvedilol) .... Take 1 Tab Two Times A Day 3)  Aspirin Adult Low Strength 81 Mg Tbec (Aspirin) .... Take 1 Tablet By Mouth Once A Day 4)  Fosinopril Sodium 20 Mg Tabs (Fosinopril Sodium) .Marland Kitchen.. 1 Tablet By Mouth Once Daily  Allergies (verified): No Known Drug Allergies  Review of Systems      See HPI  Vital Signs:  Patient profile:   50 year old male Height:      71 inches Weight:      263.25 pounds BMI:     36.85 O2 Sat:      96 % on Room air Temp:     98.0 degrees F oral Pulse rate:   78 / minute BP sitting:   94 / 72  (right arm) Cuff size:   large  Vitals Entered By: Gweneth Dimitri RN (Apr 07, 2010 11:01 AM)  O2 Flow:  Room air CC: 4 month OSA follow up.  Pt states he is 'wearing cpap everynight until it starts bothering me."  Pt states he feels like it is keeping him "stopped up" but denies problems with mask and pressure.   Comments Medications reviewed with patient Daytime contact number verified with patient. Gweneth Dimitri RN  Apr 07, 2010 11:01 AM    Physical Exam  General:  obese male in nad Nose:  no skin breakdown or pressure  necrosis from cpap mask Neurologic:  alert and oriented, does not appear sleepy, moves all 4.   Impression & Recommendations:  Problem # 1:  OBSTRUCTIVE SLEEP APNEA (ICD-327.23) the pt is having mask and congestion issues which are interfering with his ability to use cpap compliantly.  He obviously needs a different mask that doesn't leak, and we can work on his nasal hygiene to see if we can help with congestion.  He will need to have his auto study redone so we can optimize his pressure.  I have also encouraged him to work on weight loss.  Medications Added to Medication List This Visit: 1)  Nasonex 50 Mcg/act Susp (Mometasone furoate) .... Two puffs each nostril daily  Other Orders: Est. Patient Level III ([address]) DME Referral (DME) Sleep Disorder Referral (Sleep Disorder)  Patient Instructions: 1)  will get you to the sleep center for mask fitting.  Make sure you take your current mask with you. 2)  Once mask is refitted, will re-optimize your pressure again for 2 weeks.  Will let you know the results 3)  will try nasonex nasal spray 2  each nostril each am to help with nasal congestion.  Fill prescription if it helps. 4)  work on weight loss 5)  followup with me in 6mos   Prescriptions: NASONEX 50 MCG/ACT  SUSP (MOMETASONE FUROATE) Two puffs each nostril daily  #1 x 6   Entered and Authorized by:   Barbaraann Share MD   Signed by:   Barbaraann Share MD on 04/07/2010   Method used:   Print then Give to Patient   RxID:   [id number]

## 2011-01-05 NOTE — Cardiovascular Report (Signed)
Summary: Office Visit   Office Visit   Imported By: Roderic Ovens 11/09/2010 16:27:39  _____________________________________________________________________  External Attachment:    Type:   Image     Comment:   External Document

## 2011-01-05 NOTE — Letter (Signed)
Summary: Implantable Device Instructions  Architectural technologist, Main Office  [address]   [place], [place] [address]   Phone: [phone]  Fax: [phone]      Implantable Device Instructions  You are scheduled for:  _____ Permanent Transvenous Pacemaker _____ Implantable Cardioverter Defibrillator _____ Implantable Loop Recorder __X___ Generator Change  on _8/31/11____ with Dr. _KLEIN____.  1.  Please arrive at the Short Stay Center at [facility] at __9:30___ on the day of your procedure.  2.  Do not eat or drink the night before your procedure.  3.  Complete lab work on [employer] 07/31/10 AT 12:00_____.  The lab at [facility] is open from 8:30 AM to 1:30 PM and from 2:30 PM to 5:00 PM.  The lab at [facility] is open from 7:30 AM to 5:30 PM.  You do not have to be fasting.  4.  Do NOT take these medications for ____ days prior to your procedure:  _________________________.  Take your last dose of Coumadin on ________.  5.  Plan for an overnight stay.  Bring your insurance cards and a list of your medications.  6.  Wash your chest and neck with antibacterial soap (any brand) the evening before and the morning of your procedure.  Rinse well.  7.  Education material received:     Pacemaker _____           ICD _____           Arrhythmia _____  *If you have ANY questions after you get home, please call the office [phone].  *Every attempt is made to prevent procedures from being rescheduled.  Due to the nauture of Electrophysiology, rescheduling can happen.  The physician is always aware and directs the staff when this occurs.

## 2011-01-05 NOTE — Cardiovascular Report (Signed)
Summary: Office Visit   Office Visit   Imported By: Roderic Ovens 04/20/2010 16:34:23  _____________________________________________________________________  External Attachment:    Type:   Image     Comment:   External Document

## 2011-01-05 NOTE — Letter (Signed)
Summary: Work Writer, Main Office  [address]   [place], [place] [address]   Phone: [phone]  Fax: [phone]     July 28, 2010    Spencer Robertson   The above named patient had a medical visit today at: 10:40     am , AND IS TO RETURN ON FRI 07/31/10 FOR BLOOD WORK.     Sincerely yours,  Architectural technologist

## 2011-01-05 NOTE — Cardiovascular Report (Signed)
Summary: Office Visit   Office Visit   Imported By: Roderic Ovens 08/31/2010 16:15:54  _____________________________________________________________________  External Attachment:    Type:   Image     Comment:   External Document

## 2011-02-15 ENCOUNTER — Encounter: Payer: Self-pay | Admitting: Internal Medicine

## 2011-02-15 ENCOUNTER — Encounter (INDEPENDENT_AMBULATORY_CARE_PROVIDER_SITE_OTHER): Payer: Medicaid Other

## 2011-02-15 DIAGNOSIS — I428 Other cardiomyopathies: Secondary | ICD-10-CM

## 2011-02-18 LAB — CBC
HCT: 43.6 % (ref 39.0–52.0)
Hemoglobin: 15.4 g/dL (ref 13.0–17.0)
MCHC: 35.3 g/dL (ref 30.0–36.0)
Platelets: 157 10*3/uL (ref 150–400)
RDW: 14.6 % (ref 11.5–15.5)

## 2011-02-18 LAB — SURGICAL PCR SCREEN
MRSA, PCR: NEGATIVE
Staphylococcus aureus: NEGATIVE

## 2011-02-18 LAB — WOUND CULTURE: Culture: NO GROWTH

## 2011-02-23 NOTE — Procedures (Signed)
Summary: device check/sl  mca      Allergies Added: NKDA  Current Medications (verified): 1)  Spironolactone 50 Mg Tabs (Spironolactone) .... Take 1/2 Tablet By Mouth Once A Day 2)  Coreg 25 Mg Tabs (Carvedilol) .... Take 1 Tab Two Times A Day 3)  Aspirin Adult Low Strength 81 Mg Tbec (Aspirin) .... Take 1 Tablet By Mouth Once A Day 4)  Fosinopril Sodium 20 Mg Tabs (Fosinopril Sodium) .Marland Kitchen.. 1 Tablet By Mouth Once Daily 5)  Nasonex 50 Mcg/act  Susp (Mometasone Furoate) .... Two Puffs Each Nostril Daily-Prn 6)  Furosemide 20 Mg Tabs (Furosemide) .... Take Two  Tablets By Mouth Daily.  Allergies (verified): No Known Drug Allergies   ICD Specifications Following MD:  Sherryl Manges, MD     ICD Vendor:  [facility] [employer]     ICD Model Number:  n119     ICD Serial Number:  [id number] ICD DOI:  08/05/2010     ICD Implanting MD:  Sherryl Manges, MD  Lead 1:    Location: RA     DOI: 10/27/2004     Model #: 0454     Serial #: [id number]     Status: active Lead 2:    Location: RV     DOI: 10/27/2004     Model #: 8295     Serial #: [id number]     Status: active Lead 3:    Location: LV     DOI: 10/27/2004     Model #: 4194     Serial #: [id number]     Status: active  Indications::  NICM,CHF  Explantation Comments: 08/05/10 [employer] H177/282909 explanted  ICD Follow Up ICD Dependent:  No      Episodes Coumadin:  No  Brady Parameters Mode DDD     Lower Rate Limit:  60     Upper Rate Limit 120  Tachy Zones VF:  240     VT:  210     VT1:  190     Tech Comments:  meds reviewed. see paceart report. Vella Kohler  February 15, 2011 12:08 PM

## 2011-02-23 NOTE — Cardiovascular Report (Signed)
Summary: Office Visit   Office Visit   Imported By: Roderic Ovens 02/19/2011 16:07:07  _____________________________________________________________________  External Attachment:    Type:   Image     Comment:   External Document

## 2011-04-20 NOTE — Assessment & Plan Note (Signed)
[facility]                         ELECTROPHYSIOLOGY OFFICE NOTE   NAME:Spencer Robertson, Spencer Robertson                       MRN:          [id number]  DATE:03/06/2008                            DOB:          1961/03/22    Spencer Robertson is seen today in followup for nonischemic cardiomyopathy,  congestive heart failure and left bundle branch block, status post CRT-D  implantation in November 2005.  We have not seen him in some time; it  has been about a year and half since he was here.   He has had no complaints of chest pain or shortness of breath.  He has  no peripheral edema.   MEDICATIONS:  He is taking his medicines and affords to do so.   These medications include:  1. Aldactone 12.5.  2. Lanoxin 0.25.  3. Monopril 20.  4. Coreg 25 b.i.d.  5. Aspirin 81.   PHYSICAL EXAMINATION:  His blood pressure is 108/70.  His pulse is 64.  LUNGS:  Clear.  HEART:  Sounds were regular.  Neck veins were flat.  EXTREMITIES:  Without edema.   Interrogation his Guidant CRT-D demonstrated P wave of 4.4 with  impedance of 399, a threshold of 0.8 at 0.5.  The R wave was 14.3 with  impedance of 556, a threshold 0.8 at 0.5 and the LV impedance was 646  with a threshold 0.6 at 0.5 and the amplitude was 12.4.   There was an intercurrent episode of ventricular tachycardia terminated  following an episode of ATP, although not cleanly.   IMPRESSION:  1. Nonischemic cardiomyopathy.  2. Congestive heart failure -- chronic -- systolic -- now class II.  3. Status post cardiac resynchronization therapy for #1 contributing      to #2.  4. Ventricular tachycardia with appropriate antitachycardia pacing      therapy with a ventricular tachycardia cycle length of 275      milliseconds.  5. Obesity.   Spencer Robertson is doing quite well and is compensated currently.  We will  check his BMET and his digoxin levels today to monitor drug therapy.   We have discussed the importance of closer  followup and we have  scheduled him today to be seen in clinic in 3 months' time.     Duke Salvia, MD, [facility]  Electronically Signed    SCK/MedQ  DD: 03/06/2008  DT: 03/06/2008  Job #: [id number]

## 2011-04-20 NOTE — Discharge Summary (Signed)
NAME:  Spencer Robertson, Spencer Robertson NO.:  [id number]   MEDICAL RECORD NO.:  [id number]          PATIENT TYPE:  INP   LOCATION:  2902                         FACILITY:  [facility]   PHYSICIAN:  Duke Salvia, MD, FACCDATE OF BIRTH:  [birth year]   DATE OF ADMISSION:  09/12/2007  DATE OF DISCHARGE:  09/13/2007                               DISCHARGE SUMMARY   This patient has NO KNOWN DRUG ALLERGIES.   Dictation and exam greater than 35 minutes.   FINAL DIAGNOSES:  1. Admitted with chest pain (nonradiating).      a.     Troponin I studies are negative x2.      b.     Electrocardiogram nondiagnostic.  2. 2D echocardiogram. September 12, 2007.  Ejection fraction 20%.  Severe      diffuse hypokinesis/moderate to severe mitral regurgitation (volume      of mitral regurgitation equals 41 mL).  Little change from      echocardiogram taken January 2004.  3. Nonischemic cardiomyopathy/left bundle branch block.      a.     New York Heart Association II-III chronic systolic       congestive heart failure.   SECONDARY DIAGNOSES:  1. Admission January 2004 with acute on chronic congestive heart      failure.  Left heart catheterization at that time showed the      coronaries were angiographically normal.  2. The patient is status post Guidant Contak Renewal CRT D, November      2005, Dr. Sherryl Manges.  3. Obstructive sleep apnea.  Recent pulmonary consult, with addition      of bilevel positive airway pressure.  4. Hypertension.  5. Dyslipidemia.   PROCEDURES THIS ADMISSION:  2D echocardiogram, September 12, 2007.  Dictation above.   BRIEF HISTORY:  Mr. Hardenbrook is a 50 year old male.  He has a known history  of nonischemic cardiomyopathy, ejection fraction 20%.  He is status post  cardioverter defibrillator with left ventricular pacing Dr. Sherryl Manges.  He has a history of hypertension and possible substance abuse.   He presented on September 12, 2007 to the emergency room after an  altercation with a friend.  He had chest pain.  It  was sharp,  nonradiating.  He had shortness of breath, diaphoresis, and nausea.  He  began to feel badly and came to the emergency room.   Initial assessment by the emergency room physician diagnosed the patient  with an ST elevated myocardial infarction.  Further evaluation showed  that the patient had a pacer, and the electrocardiogram was adjusted for  this and found to be normal.  The patient was placed on IV nitroglycerin  and IV heparin.  The patient's point of care markers are less than 0.05.  He will be admitted, however, on both IV heparin and IV nitroglycerin.  Cardiac enzymes will be cycled.  He will have a pulmonary consult since  he has had shortness of breath and fevers, incipient upper respiratory  illness.  He did have hypertension, poorly controlled on admission to  the emergency room.  This will be moderated with IV nitroglycerin and  medical therapy instituted if necessary.   HOSPITAL COURSE:  After admission September 12, 2007 to the emergency room  with chest pain after an argument with a friend, the patient says that  his chest pain abated quickly after the substance of the argument was  resolved, and the patient had felt better.  He has had no chest pain  ongoing overnight.  His troponin I studies are 0.03, then 0.03.  He has  had no recurrence of chest pain.  His blood pressure is fairly well  controlled, with a systolic blood pressure 142 on discharge.  The  patient is not short of breath on this admission at all and is not  complaining of either palpitations, syncope, or lower extremity edema.  He is achieving 97% on room air, and his lungs are clear to  auscultation.  The patient is discharging after being seen by Dr. Valera Castle on September 13, 2007.   He goes home on his home medications, which are:  1. Coreg 12.5 mg twice daily.  2. Digoxin 0.25 mg daily.  3. Lisinopril 10 mg daily.  4. Lasix 40 mg daily.   5. Spironolactone 25 mg daily.  6. Zocor 20 mg daily at bedtime.  7. Advair 250/50, 1 puff twice daily.  This is a new medication,      instituted by pulmonary.  8. Also, we recommend adding enteric-coated aspirin 81 mg daily and      Prilosec over-the-counter 20 mg daily for acid reflux.   If anything, his lisinopril could be boosted 10 mg twice daily at office  visit, which will be planned for a couple weeks from now.  He has  followup at [facility], [address] stress test  Wednesday September 20, 2007 at 9:15.  He is counseled to eat nothing  after midnight Tuesday September 19, 2007.  He may take his oral morning  medications.  He sees Dr. Graciela Husbands in followup on Monday September 25, 2007  at 3:00.   LABORATORY STUDIES:  Complete blood count:  White cells 0.5, hemoglobin  16, hematocrit 47.1, platelets 142.  Serum electrolytes:  Sodium was  136, potassium 3.8, chloride 100, bicarbonate 29, BUN 7, creatinine 1,  glucose 91, alkaline phosphatase this admission is 53, SGOT 31, SGPT 27.  Troponin I studies are 0.03, then 0.03.  Total cholesterol 162, LDL  cholesterol 101, HDL cholesterol 46, triglycerides 77.  The BNP this  admission was 84.  Digoxin level 0.8.  D-dimer was 0.52.  The patient  tested positive for tetrahydrocannabinol.      Maple Mirza, [place]      Duke Salvia, MD, [facility]  Electronically Signed    GM/MEDQ  D:  09/13/2007  T:  09/14/2007  Job:  [id number]

## 2011-04-20 NOTE — Consult Note (Signed)
NAME:  DUNG, SALINGER NO.:  [id number]   MEDICAL RECORD NO.:  [id number]          PATIENT TYPE:  INP   LOCATION:  2902                         FACILITY:  [facility]   PHYSICIAN:  Coralyn Helling, MD        DATE OF BIRTH:  1961-11-23   DATE OF CONSULTATION:  DATE OF DISCHARGE:                                 CONSULTATION   REASON FOR CONSULTATION:  Bronchitis and obstructive sleep apnea.   HISTORY OF PRESENT ILLNESS:  Mr. Pehl is a 50 year old male who has a  history of nonischemic cardiomyopathy status post AICD placement.  He  also had an overnight polysomnogram done on November 07, 2004, was found  to have severe obstructive sleep apnea with apnea/hypopnea index of 49  and an oxygen saturation 86%.  He was started on CPAP at 18 cm of water  after that.  He says that for the last week he had been having symptoms  of an upper respiratory infection.  This consisted of nasal congestion,  postnasal drip and cough with clear sputum with occasional blood  streaking.  He also has chest tightness and congestion.  He felt  feverish but has not checked his temperature.  He was having frequent  bowel movements but this has improved.  He denied having dyspnea on  exertion.  He has not had any abdominal pain or nausea.  He also denies  any swelling or rashes.  With regards to his sleep apnea, again he was  started on CPAP at 18 cm of water.  He has a full-face mask with a  heated humidifier.  He complains of dryness in his nose and mouth.  He  has not tried adjusting the temperature on his humidifier.  He says he  has not been able to use his CPAP machine for at least the last several  weeks.   PAST MEDICAL HISTORY:  1. Hypertension.  2. Nonischemic cardiomyopathy.  3. Mitral regurgitation.  4. Hyperlipidemia.  5. He is status post AICD placement.  6. Tobacco abuse.  7. Severe obstructive sleep apnea.   ALLERGIES:  None.   MEDICATIONS:  Coreg, digoxin, lisinopril, Lasix,  spironolactone and  Zocor.   REVIEW OF SYSTEMS:  Unremarkable except for as stated above.   SOCIAL HISTORY:  He is on disability.  He used to work Holiday representative.  He  smokes a half-a-pack of cigarettes a day and has done so for the last 25  years.  He has occasional alcohol use.  He also has occasional drug use,  which he says is marijuana.   FAMILY HISTORY:  Significant for his father cardiomyopathy.   PHYSICAL EXAMINATION:  GENERAL:  Physical exam he is seen in the  intensive care unit.  He is awake, alert and oriented did not appear to  be acute distress.  VITAL SIGNS:  Heart rate is 64 and paced, blood pressure is 147/80,  respiratory rate is 18, oxygen saturation 100% on room air.  HEENT:  Pupils reactive.  There is no sinus tenderness noted or  discharge.  He has Mallampati IV airway with  2+ tonsils.  There is no  lymphadenopathy.  No jugular venous distention.  HEART:  S1-S2.  CHEST:  There is decreased breath sounds, prolonged x-ray phase and  focal wheezing heard at the bases.  ABDOMEN:  Soft, nontender, positive bowel sounds.  No organomegaly.  EXTREMITIES:  There is no edema, cyanosis, clubbing.  NEUROLOGIC:  No focal deficits were appreciated.  Hemoglobin is 15.7,  hematocrit 45.6, WBC is 5.6, platelet count 150, creatinine is 1.1.  PT  13.9, INR 1.1, PTT 30, BNP is pending, Digoxin 0.8.  Sodium 136,  potassium 4, chloride is 101, BUN is 8, glucose is 93, D-dimer is 0.52  troponin less than 0.05.   IMPRESSION:  1. Bronchitis.  It appears that he has likely developed a viral upper      respiratory tract infection.  He does also have a history      significant for tobacco abuse.  He does also have wheezing on exam.      I will start him on Advair 250/50 one puffs b.i.d. and give him      Combivent as needed.  I have also discussed with him the importance      of smoking cessation.  At some point, he would also need to have an      influenza vaccination as well as  pneumococcal vaccination.  I do      not think that he needs antibiotics or systemic steroids at the      present time, particularly since he had an unremarkable chest x-      ray.  2. Severe obstructive sleep apnea.  I will start him on auto BiPAP.  I      am suspicious that part of his difficulties with tolerating PAP      therapy might be related to the high-pressures on CPAP.  Hopefully,      by switching the BiPAP, he will be able to tolerate this more.  He      would then need to have this further assessed on an outpatient      basis.  3. Chest pain, hypertension, hyperlipidemia, nonischemic      cardiomyopathy status post AICD placement.  This is to be further      managed by Cardiology.      Coralyn Helling, MD  Electronically Signed     VS/MEDQ  D:  09/12/2007  T:  09/12/2007  Job:  [id number]

## 2011-04-20 NOTE — Assessment & Plan Note (Signed)
[facility]                            CARDIOLOGY OFFICE NOTE   Spencer Robertson, Spencer Robertson BOOMER                         MRN:          [id number]  DATE:06/19/2008                            DOB:          1961/09/03    [name] came to the office today to have his CRT-D pacer device  interrogated.  He complained to our technicians about having some chest  pain and shortness of breath.  They asked me to see him as 'doc of the  day'.   He has nonischemic cardiomyopathy and had basically normal coronary  arteries by cath in 2004 by Dr. Jacinto Halim.  He has been followed by Dr[facility]  [facility]l [facility] and said that Dr. Jacinto Halim is not  his cardiologist.  He has been seeing Dr. Thomos Lemons in the past, but  has lost follow up with the primary care.   He thinks that changing to some generic medications have something to do  with triggering his chest pain and shortness of breath.  There were no  changes in the type of medications he was taking.   He denies any orthopnea or PND.  His chest discomfort is described as a  nondescript tightness that does not get worse with exertion or other  activities.  It does not hurt when he takes a deep breath.  He has had  no fever, chills, cough, or hemoptysis.   He is on spironolactone 12.5 mg a day, Lanoxin 0.25 mg a day, Monopril  20 mg a day, Coreg 25 b.i.d., and aspirin 81 mg a day.   PHYSICAL EXAMINATION:  VITAL SIGNS:  Blood pressure today is 90/60 in  both arms, pulse 65 and regular, and his weight is 251.  Respiratory  rate is 18 and unlabored.  His EKG basically shows normal sinus rhythm  with ventricular pacing.  He has a BiV device.  GENERAL:  He is in no acute distress.  SKIN:  Warm and dry.  HEENT:  Unremarkable.  Carotid upstrokes are equal bilaterally without  bruits.  No JVD.  Thyroid is not enlarged.  LUNGS:  Clear without rub or rales.  HEART:  Poorly appreciated PMI.  He is a big muscular gentleman.  He  has  a normal S1 and S2 without rub or gallop.  ABDOMEN:  Soft.  EXTREMITIES:  No cyanosis, clubbing, or edema.  Pulses are intact.  There is no sign of obvious DVT.   ASSESSMENT:  1. Noncardiac chest pain.  2. Dyspnea which is from his nonischemic cardiomyopathy and is      baseline.  I do not see any evidence of decompensated heart failure      at this time.   I have reassured him.  I have asked him to contact Dr. Artist Pais for primary  care followup.  He has a lot of questions concerning additional benefits  from Medicare.  I will have him come back to see Dr. Graciela Husbands in 3 months.     Thomas C. Daleen Squibb, MD, [facility]  Electronically Signed    TCW/MedQ  DD: 06/19/2008  DT: 06/20/2008  Job #: [id number]

## 2011-04-20 NOTE — H&P (Signed)
NAME:  LUVERN, MCISAAC NO.:  [id number]   MEDICAL RECORD NO.:  [id number]          PATIENT TYPE:  INP   LOCATION:  2902                         FACILITY:  [facility]   PHYSICIAN:  Arturo Morton. Riley Kill, MD, FACCDATE OF BIRTH:  01-19-1961   DATE OF ADMISSION:  09/12/2007  DATE OF DISCHARGE:                              HISTORY & PHYSICAL   PRIMARY CARE PHYSICIAN:  Unknown.   PRIMARY CARDIOLOGIST:  Dr. Berton Mount.   HISTORY OF PRESENT ILLNESS:  This is a 50 year old obese African-  American male with a known history of nonischemic cardiomyopathy with an  EF of 10% to 20% status post ICD defibrillator/pacemaker per Dr. Sherryl Manges in October 2007 with history of hypertension who presented to the  emergency room with complaints of chest pain after an argument with a  friend last evening.  He describes the pain as sharp and non-radiating  with associated shortness of breath, diaphoresis, and nausea.  He began  to feel worse and came to the emergency room.  On initial assessment by  ER physician, the patient was diagnosed with an ST-elevated MI, but  after further evaluation the patient states that he had a pacemaker, and  EKG was adjusted to account for this and was found to be normal.  He was  placed on nitroglycerin and heparin, and Dr. Bonnee Quin was called to  evaluate secondary to questionable ST-elevated MI.  The patient's point-  of-care markers were essentially negative.  After institution of  nitroglycerin and heparin and aspirin, the patient became pain-free.  He  was placed on oxygen and is now stable.   The patient was seen and examined by Dr. Bonnee Quin and found not to be  exhibiting an ST-elevated MI.  EKG was showing paced rhythm.  The  patient will be continued on nitroglycerin and heparin and moved to the  unit initially.   REVIEW OF SYSTEMS:  Positive for chest pain, diaphoresis, nausea, and  generalized weakness, otherwise negative.   PAST MEDICAL  HISTORY:  1. Nonischemic cardiomyopathy with an EF of 10% to 15% per most recent      echo 1 year ago.  2. Hypertension.  3. A history of obstructive sleep apnea.  4. Obesity.  5. Tobacco abuse.   PAST SURGICAL HISTORY:  Implantation of cardiac defibrillator per Dr.  Sherryl Manges, was a Guidant Contact H177 device.   SOCIAL:  The patient lives in [place] with his mother.  He is  disabled.  He is unmarried.  He is a 25-pack-year smoker, social  alcohol, occasional marijuana use.  No exercise, no specialized diet.   FAMILY HISTORY:  Mother is in good health.  Father died from  complications of hypertensive cardiomyopathy.  He has siblings who are  in good health.   CURRENT MEDICATIONS:  1. Coreg, most recent dose 25 b.i.d.  2. Digoxin 0.25 mg once a day.  3. Lisinopril 10 mg once a day.  4. Lasix 40 mg once a day.  5. Spironolactone 25 mg once a day.  6. Zocor 20 mg daily.   ALLERGIES:  NO KNOWN DRUG ALLERGIES.   CURRENT LABS:  Hemoglobin 15.7, hematocrit 45.6, white blood cells 5.6,  platelets 150.  Sodium 136, potassium 4.0, chloride 101, CO2 of 43, BUN  8, creatinine 1.1, glucose 93.  PT 13.9, INR 1.1, digoxin 0.8.  Point-of-  care markers:  Troponin less than 0.05, CK-MB 1.9, CK 108.  Chest x-ray  revealing no acute findings.  EKG revealing paced rhythm with  ventricular rate of 82 beats per minute.   PHYSICAL EXAM:  VITAL SIGNS:  Blood pressure 156/99, pulse 64,  respirations 20, pulse ox 94% on room air.  HEENT:  Head is normocephalic, atraumatic.  Eyes:  PERRLA.  Mucous  membranes and mouth pink and moist.  Tongue is midline.  NECK:  Supple, obese.  There is no JVD or carotid bruits appreciated.  CARDIOVASCULAR:  Regular rate and rhythm with S4 murmur auscultated.  Pulses are 2+ and equal bilaterally.  LUNGS:  Inspiratory/expiratory wheezes are noted.  ABDOMEN:  Obese, nontender with 2+ bowel sounds.  EXTREMITIES:  Without clubbing, cyanosis, or edema.  NEURO:   Cranial nerves II through XII are grossly intact.   IMPRESSION:  1. Chest pain.  2. Hypertension.  3. Nonischemic cardiomyopathy with an ejection fraction 10% to 15%.  4. Obesity.   PLAN:  The patient has been seen and examined by Dr. Bonnee Quin and  myself in the emergency room with history of nonischemic cardiomyopathy  with recent upper respiratory infection.  He developed chest pains after  an argument and has had a cough.  Prior CRT, now pain-free.  EKG normal  sinus rhythm with atrial tracking and V pacing.  On exam, the patient is  stable.  He does have diffuse rhonchi and wheezes on exam with an S4  gallop.  The JVP is flat, and there is no edema.  Abdomen is soft.   Our plan will be to admit the patient to ICU, continue nitroglycerin and  heparin, restart patient's current medication regimen.  We will check a  D-dimer, BNP to evaluate further.  We will have a pulmonary consult  secondary to his  shortness of breath, wheezing, and rhonchi to evaluate for bronchitis  and begin treatment at their discretion.  We will try and wean  nitroglycerin once blood pressure is better controlled and follow along,  making further recommendations based upon patient's response to  treatment.      Bettey Mare. Lyman Bishop, NP      Arturo Morton. Riley Kill, MD, [facility]  Electronically Signed    KML/MEDQ  D:  09/12/2007  T:  09/12/2007  Job:  [id number]

## 2011-04-23 NOTE — Discharge Summary (Signed)
NAME:  [facility], Spencer Robertson NO.:  [id number]   MEDICAL RECORD NO.:  [id number]                   PATIENT TYPE:  INP   LOCATION:  0353                                 FACILITY:  [facility]   PHYSICIAN:  Jackie Plum, M.D.             DATE OF BIRTH:  [birth year]   DATE OF ADMISSION:  12/11/2002  DATE OF DISCHARGE:  12/15/2002                                 DISCHARGE SUMMARY   DISCHARGE DIAGNOSES:  1. 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     A. Two-D echocardiogram on 12/12/02 remarkable for severely reduced EF of        10 to 20% with severe diffuse left ventricular hypokinesis; akinesis        of the anteroseptal, septal, inferior, and periapical wall. Doppler        parameters consistent with diastolic dysfunction.     B. Cardiac test done on 12/14/02 remarkable for absence of any coronary        artery pathology. Moderate to severe mitral regurgitation with        pulmonary hypertension.  2. History of hypertension.  3. History of tobacco abuse.  4. History of alcohol.   DISCHARGE MEDICATIONS:  1. Lasix 40 mg p.o. daily.  2. Aspirin 325 mg p.o. daily.  3. Prinivil 10 mg p.o. daily.  4. Nicotine patch 14 mg 24-hour patch daily.  5. Coreg 3.125 mg p.o. b.i.d.  6. Zocor 20 mg p.o. q.h.s.  7. Spironolactone 12.5 mg p.o. daily.   DISCHARGE LABORATORY DATA:  WBC count 5.4, hemoglobin 17.1, hematocrit 51.4,  MCV 3.6, platelet count 194. Sodium 158, potassium 4.3, chloride 98, CO2 51,  glucose 107, BUN 10, creatinine 1.2. Total bilirubin 1.3, alkaline  phosphatase 65, SGOT 30, SGPT 22, total protein 6.5, albumin 3.3, calcium  8.9. TSH 4.922. Total cholesterol 136, triglycerides 76, HDL 30, LDL 91.   CONSULTANTS:  Dr. Yates Decamp of [facility].   PROCEDURE:  Cardiac catheterization as noted above.   ACTIVITY:  As tolerated.   DIET:  Cardiac diet.   DISCHARGE INSTRUCTIONS:  The patient has had extensive  discussion regarding  the need to stop cigarette smoking and to stop drinking alcohol. The patient  has also been told to take all of his medications as mentioned to him and to  report to the emergency room if there is any problems including increasing  shortness of breath and lower extremities swelling up.   FOLLOW UP:  Followup appointment will be with Dr. Jacinto Halim in two to three  weeks. The patient is to call for appointment. The patient also instructed  to go [employer] and register to be seen by a [place] as soon  as possible when leaving the hospital.   HISTORY OF PRESENT ILLNESS:  The patient presented on 12/11/02 with one month  history of shortness of breath, dyspnea with minimal exertion to the  extent  he has had difficulty going to the bathroom. 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 The patient denied any concurrent chest  pain that was noted. He also reported some orthopnea. He had to sit in a  chair to sleep. He also has paroxysmal nocturnal dyspnea over the last one  month. He admitted lower extremity edema over the last one week. On  presentation, the patient's pulse was 100 with a respiratory rate of 27. He  had some crackles bilaterally at the bases, especially more on the right. He  had JVP at 11 cm. He had some bilateral lower extremity edema. Chest x-ray  revealed remarkable cardiomegaly and some edema. EKG showed sinus rhythm at  110 per minute with ST-T wave changes. Because of these findings, the  patient was admitted for further evaluation and management anticipated  congestive heart failure.   HOSPITAL COURSE:  Problem 1. Congestive heart failure/nonischemic dilated  cardiomyopathy. The patient was admitted to a telemetry bed. Telemetry  monitoring indicated episodes of sinus tachycardia up to about 107 with  episode of sinus bradycardia at about 58 beats per minute with 2.5 second  pauses. He received diuresis and ACE inhibitor  and beta blockade was added  to patient's medication regimen. Two-D echocardiogram was as noted above on  account of which cardiology was consulted in view of his severe EF, and a  cardiac catheterization was done on 12/14/02. Cardiac catheterization revealed  absence of any coronary artery disease. He also gave a history of dilated  cardiomyopathy in the family, and therefore, suspicion of hereditary  cardiomyopathy was obtained. The patient's symptoms have improved  remarkable. His left lower extremity edema has almost completely resolved.  His O2 saturation on room air is 100% the day of discharge. The patient has  been discharged home on above medication regimen with outpatient followup as  mentioned above.  This patient is going home.   CONDITION ON DISCHARGE:  Improved and stable.                                               Jackie Plum, M.D.    GO/MEDQ  D:  12/15/2002  T:  12/16/2002  Job:  [id number]   cc:   Jerrye Beavers R. Jacinto Halim, M.D.  [address]  [place]  [place] [address]  Fax: [phone]

## 2011-04-23 NOTE — Cardiovascular Report (Signed)
NAME:  CYLAN, BORUM NO.:  [id number]   MEDICAL RECORD NO.:  [id number]                   PATIENT TYPE:  INP   LOCATION:  4709                                 FACILITY:  [facility]   PHYSICIAN:  Cristy Hilts. Jacinto Halim, M.D.                  DATE OF BIRTH:  08-29-1961   DATE OF PROCEDURE:  12/14/2002  DATE OF DISCHARGE:                              CARDIAC CATHETERIZATION   PROCEDURE PERFORMED:  1. Left and right heart catheterization.  2. Left ventriculography.  3. Selective right and left coronary arteriography.  4. Abdominal aortogram.  5. Right heart catheterization including hemodynamic monitoring and     outpatient cardiac output and cardiac index.   INDICATION:  The patient is a 50 year old gentleman with no significant past  cardiac history, was admitted to the hospital with congestive heart failure.  He had an abnormal echocardiogram which revealed moderately reduced left  ventricular systolic function, ejection fraction 10%.  Given this, he was  brought to the cardiac catheterization laboratory to evaluate for reversible  cause for his severe cardiomyopathy.   HEMODYNAMIC DATA:  Left heart catheterization:  The left ventricular  pressure 90/70 with an end-diastolic pressure of 20 mmHg. Aortic pressure  87/67 with a mean of 77 mmHg.  There was no pressure gradient across the  aortic valve.   ANGIOGRAPHIC DATA:   LEFT VENTRICULOGRAM:  Left ventricle:  The left ventricle is markedly  dilated with moderately reduced left ventricular systolic function, ejection  fraction estimated at 10%.  There is moderate generalized hypokinesis. There  is suggestion of severe mitral regurgitation.   Right coronary artery:  The right coronary artery is a large caliber vessel  and a dominant vessel.  It is normal.   Left main coronary artery:  The left main coronary artery is a large caliber  vessel.  It is normal.   Circumflex coronary artery: The circumflex  coronary artery is a large  caliber vessel.  It is normal.   Ramus intermedius:  The ramus intermedius is much larger than the circumflex  and it is normal.   Left anterior descending artery:  Left anterior descending artery is a large  caliber vessel and gives origin to a very large diagonal #1.  It is normal.   Right heart catheterization:  Right atrial pressures were 18/16 with a mean of 16 mmHg.  The right  ventricular pressure 44/15 with a mean of 16 mmHg.   The pulmonary systolic pressure was 60 mmHg which reduced to 52 mmHg with a  diastolic of 25 with a mean of 38 mmHg. The pulmonary capillary wedge was  32/28 with a mean of 24 mmHg.    IMPRESSION:  1. 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  2. Moderately severe mitral regurgitation.  3. Moderately severe pulmonary hypertension, which is reversible by     intrapulmonary nitroglycerin administration.   RECOMMENDATIONS:  Continue aggressive medical therapy as indicated.   TECHNIQUE OF PROCEDURE:  Under the usual sterile precautions, using a 8  French right femoral artery venous and 6 French right femoral venous access  left and right heart catheterization was performed.   TECHNIQUE OF LEFT HEART CATHETERIZATION:  A 6 [place] multipurpose B2  catheter was advanced into the ascending aorta over a 0.035 inch J wire.  The catheter was gently advanced in the left ventricle and left ventricular  pressures were monitored. Hand contrast injection of the left ventricular  was performed, both in LAO and RAO projection. The  catheter was flushed  with saline and pulled back into the ascending aorta and pressure gradient  across gradient across the aortic valve was monitored.  Right coronary  artery was selectively engaged and angiography was performed.  In a similar  fashion, the left main coronary artery was selectively engaged and  angiography was performed.  Then the catheter was pulled back into the   abdominal aorta and abdominal aortogram was performed.  Then the catheter  was pulled out of the body.   TECHNIQUE OF RIGHT HEART CATHETERIZATION:  A pulmonary arterial catheter was  easily advanced to the right femoral venous sheath.  The catheter was gently  advanced in the right atrium, right ventricle, and the wedge near the  pulmonary artery and pulmonary capillary wedge was easily obtained.  Hemodynamic monitoring was performed.  The pulmonary arterial saturation and  femoral arterial saturation were also obtained to calculate the cardiac  output by Fick method.   The patient tolerated the procedure well.  He was transferred to the  recovery unit in a stable condition.                                                 Cristy Hilts. Jacinto Halim, M.D.    Pilar Plate  D:  12/14/2002  T:  12/15/2002  Job:  [id number]   cc:   Jackie Plum, M.D.  [address]  [place]  [place] [address]  Fax: [phone]   __________, Dr.

## 2011-04-23 NOTE — Procedures (Signed)
NAME:  Spencer Robertson, Spencer Robertson NO.:  [id number]   MEDICAL RECORD NO.:  [id number]          PATIENT TYPE:  OUT   LOCATION:  SLEEP CENTER                 FACILITY:  [facility]   PHYSICIAN:  Marcelyn Bruins, M.D. [facility] DATE OF BIRTH:  1960-12-23   DATE OF STUDY:  11/17/2004                              NOCTURNAL POLYSOMNOGRAM   REFERRING PHYSICIAN:  Sherryl Manges, MD   INDICATIONS FOR STUDY:  Hypersomnia with sleep apnea. Epworth sleepiness  score 9.   SLEEP ARCHITECTURE:  The patient had a total sleep time of 394 minutes with  decreased REM and never achieved slow wave sleep. Sleep efficiency was 84%.  Sleep onset was normal as was REM onset.   IMPRESSION/RECOMMENDATIONS:  1.  Split night study reveals severe obstructive sleep apnea/hypopnea      syndrome with the patient having 105 obstructive events in the first 129      minutes of sleep. This gave him a respiratory disturbance index of 49      events per hour with O2 desaturation as low as 56%. Events were clearly      worse during REM and in the supine position. There was very loud snoring      noted prior to the initiation of CPAP. Per protocol the patient was      placed on a large Comfort Gel CPAP mask and pressure was increased      incrementally in order to treat both obstructive events and snoring. At      a final pressure of 18 cm, the patient had good control of all      parameters.  2.  Paced rhythm noted throughout the study. There was no clinically      significant cardiac arrhythmia.  3.  Large numbers of leg jerks with very little sleep disruption.      KC/MEDQ  D:  11/23/2004 14:25:37  T:  11/24/2004 15:59:37  Job:  [id number]

## 2011-04-23 NOTE — Assessment & Plan Note (Signed)
Spencer Robertson                           ELECTROPHYSIOLOGY OFFICE NOTE   NAME:Draheim, EMEKA LINDNER                       MRN:          [id number]  DATE:09/13/2006                            DOB:          Jul 15, 1961    Mr. Spencer Robertson has nonischemic cardiomyopathy and congestive heart failure status  post CRT.  He is doing pretty well.  His most exercise is walking his dog.  He is currently not working.   Reviewing his medications, he is taking:  1. Coreg 25 mg b.i.d.  2. Lisinopril.  3. Aldactone 25 mg.  4. Lanoxin 0.25 mg.   PHYSICAL EXAMINATION:  VITAL SIGNS:  On examination today, his blood  pressure was 128/78 and his pulse is 71.  LUNGS:  Clear.  CARDIAC:  Heart sounds were regular.  EXTREMITIES:  Without edema.   Interrogation of his Guidant Contac H177 device demonstrates a P wave of  4.6, an impedance of 351, a threshold of 0.4 at 0.5, R wave of 19.9 with an  impedance of 563, a threshold of 0.4 at 0.5.  LV was 5.9 with impedance of  748, threshold of 1 V at 0.5.  Battery voltage is 3.11.  There are no  intercurrent episodes.   IMPRESSION:  1. Nonischemic cardiomyopathy.  2. Status post implantable cardioverter-defibrillator for primary      prevention.  3. Obesity.  4. Obstructive sleep apnea.   Mr. Spencer Robertson is stable.  He has been established intercurrently with primary  care.   We will plan to see him again in 4 months' time.            ______________________________  Duke Salvia, MD, [facility]     SCK/MedQ  DD:  09/13/2006  DT:  09/15/2006  Job #:  [id number]

## 2011-04-23 NOTE — Discharge Summary (Signed)
NAME:  Spencer Robertson, Spencer Robertson NO.:  [id number]   MEDICAL RECORD NO.:  [id number]          PATIENT TYPE:  INP   LOCATION:  3712                         FACILITY:  [facility]   PHYSICIAN:  Charlies Constable, M.D. [facility] DATE OF BIRTH:  29-May-1961   DATE OF ADMISSION:  10/27/2004  DATE OF DISCHARGE:  10/28/2004                                 DISCHARGE SUMMARY   DISCHARGE DIAGNOSES:  1.  Status post implantation of Guidant BiV/ICD.  This is a model CONTAK      RENEWAL 3 HE, model H177, serial number [id number] with thresholds testing      of less than or equal to 31 joules.  2.  Post procedure chest x-ray shows no pneumothorax and leads appropriate.   SECONDARY DIAGNOSES:  1.  Nonischemic cardiomyopathy.  2.  Class 3 congestive heart failure.  3.  Paroxysmal nocturnal dyspnea with four pillow orthopnea.  4.  History of syncope.  5.  Severe mitral regurgitation.  6.  Moderate pulmonary hypertension.  7.  Left bundle branch block with QRS at 140 msec.   PROCEDURE:  October 27, 2004 implantation of Guidant biventricular ICD by  Dr. Sherryl Manges.   HOSPITAL COURSE:  The patient discharging post procedure day #1.  He has had  no postop complications.  Incision looks good.  He is maintaining sinus  rhythm.  His pain is well controlled with oral analgesia Tylenol.  He is  alert, oriented.  Goes home on the following medications:  1.  Niaspan 500 mg at bedtime.  2.  Coreg 25 mg twice daily.  3.  Imdur 60 mg daily.  4.  Prinivil 20 mg daily.  5.  Aldactone 25 mg daily.  6.  Enteric coated aspirin 325 mg daily.  7.  For pain, Tylenol 325 mg 1-2 tabs every 4-6 hours as needed.   Activity has been discussed with the patient.  The patient is to keep the  left arm quiet by his side until Saturday, November 26.  He is to keep his  incision dry until Tuesday, November 29.  He has followup at the ICD Clinic  at [facility] Wednesday November 11, 2004 at 9:30 in the morning.  He  sees Dr.  Graciela Husbands in February 2006.  Dr. Odessa Fleming office will call with that  appointment.   BRIEF HISTORY:  Mr. Dempster is a 50 year old male.  He has significant  nonischemic cardiomyopathy and congestive heart failure Class III.  He has  shortness of breath walking 100 yards.  He has nocturnal dyspnea.  He has  four pillow orthopnea and occasional peripheral edema.  He also had an  episode of syncope about one year ago.  He woke up on the floor and does not  know how long he was out.  The patient will need sleep study.  Has not had  one yet, and probably a defibrillator with support of anesthesia.  Benefits  and risks have been discussed with the patient, and the patient understands  the risks, willing to undergo the procedure.   HOSPITAL COURSE:  Mr. Brandon presents  electively  November 22.  He underwent  implantation of Guidant BiV ICD with no complications as discussed above.  Discharging post procedure day #1.  Incision looks good with mild swelling.  He will present in two weeks to the pacer clinic December 7.       GM/MEDQ  D:  10/28/2004  T:  10/28/2004  Job:  [id number]   cc:   Tresa Endo L. Philipp Deputy, M.D.  [phone]  [place] [address]  Fax: [phone]

## 2011-04-23 NOTE — Op Note (Signed)
NAME:  Spencer Robertson, Spencer Robertson NO.:  [id number]   MEDICAL RECORD NO.:  [id number]          PATIENT TYPE:  OIB   LOCATION:  3712                         FACILITY:  [facility]   PHYSICIAN:  Duke Salvia, M.D.  DATE OF BIRTH:  07/03/1961   DATE OF PROCEDURE:  10/27/2004  DATE OF DISCHARGE:                                 OPERATIVE REPORT   PREOPERATIVE DIAGNOSES:  1.  Non-ischemic cardiomyopathy.  2.  Congestive heart failure.  3.  Left bundle branch block.  4.  Obstructive sleep apnea.   POSTOPERATIVE DIAGNOSES:  1.  Non-ischemic cardiomyopathy.  2.  Congestive heart failure.  3.  Left bundle branch block.  4.  Obstructive sleep apnea.   PROCEDURE:  Dual chamber defibrillator implantation with left ventricular  lead insertion defibrillation threshold testing.   CARDIOLOGIST:  Duke Salvia, M.D.   DESCRIPTION OF PROCEDURE:  After obtaining an informed consent the patient  was brought to the electrophysiology laboratory and placed on the fl  uroscopic table in the supine position.  After a routine prep and drape, the  patient was submitted to deep sedation under the care of Dr. Guadalupe Maple.  After the routine prep and drape, lidocaine was infiltrated in the  subclavicular pre-pectoral area.  An incision was made and carried down to  the layer of the pre-pectoral fascia using electrocautery.  A pocket was  formed similarly.  Hemostasis was obtained.  Thereafter attention was turned to gaining access to the extra-thoracic left  subclavian vein, which was accomplished without difficulty without the  aspiration of air or puncture of the artery.  Three separate vena punctures  were accomplished around more caudal.  A #0 silk suture was placed and  allowed to hang loosely.  Over the most caudal guide wire, a 9-French sheath  was placed which was then passed a Guidant 0158 dual coiled defibrillator  lead, serial [id number].  Under fluoroscopic guidance it was manipulated  in the  right ventricular apex.  The apical septum with a bipolar R-wave was 10 mV  with a pacing impedance of 850 ohms and a threshold of 0.9 V at 0.5 msec  with a currented threshold of 1.6 MA.  There was no diaphragmatic pacing at  10 V.  This sleeve was secured to the pre-pectoral fascia and then over the most  cephalad guide wire a 9.5-French sheath was placed, through which was passed  a Guidant extended hook CS cannulation catheter.  The coronary sinus was  cannulated with minimal difficulty, as seen that the wire, only prolapsed in  a little bit, and then had to be supported by further deployment of the  sheath.  Venograms were obtained and demonstrated a posterior coursing  branch, a lateral coursing branch that had more lateral than a posterior  component, and then a superior branch.  The middle branch was targeted.  An  EDSCSJ Whisper wire was utilized in conjunction with a Guidant EZ-track-2  lead.  In the more posteriorly-directed branch, the patient's coughing and  snoring kept prolapsing the lead back from a position in  the junction of the  mid to distal third to the proximal third.  Because of this, it was elected  to try and locate the more laterally-coursing branch.  This was successfully  accomplished, and the lead was placed in this location.  After it had been  placed, while we were removing the delivery system, the lead prolapsed.  (Please note that the right atrial lead was placed prior to this.)  We  repositioned the LV lead in the same laterally-coursing branch, and then  watched it for about three to five minutes.  The patient was allowed to wake  up from a deeper sedation which we had undertaken because of the coughing,  using an LMA, and then as the patient started to cough again, this lead  prolapsed.  At this point it was elected to abandoned this lead, and a  Medtronic #4194, 88 cm passive fixation lead serial #[id number] lead was  passed into the branch that  coursed more posteriorly, as this was the branch  that would hold the lead.  In this location the bipolar L-wave was 9.5 mV,  with a pacing impedance of 960 ohms, a threshold of 0.5 V at 0.9 msec, with  a currented threshold of 0.9 MA.  This lead was secured and then removed.  Notably the RA lead had been placed prior to the initial attempts to remove  the LV delivery system.  The Medtronic #5076, 52 cm lead, serial #[id number]  was passed under fluoroscopic guidance to the right atrial free-wall as the  right atrium was too large to hold a J-Stylet.  The bipolar P-wave was 2.9  mV with a pacing impedance of 590 ohms and a threshold of 0.5 V at 0.5 msec,  a currented threshold at 0.9 MA.  There was no diaphragmatic pacing at 10 V.  This lead was then secured to the pre-pectoral fascia and the leads were  then attached to a Guidant renewal defibrillator.  The defibrillation  threshold testing was undertaken.  The ventricular fibrillation was induced  using a T-wave shock. After a total duration of 8 seconds, a 21 joule shock  was delivered through a measured resistance of 41 ohms, failing to terminate  ventricular fibrillation, requiring an external shock of 360 joules  delivered, which terminated ventricular fibrillation and restored sinus  rhythm.  At this point it was elected to place a high energy can; however, prior to  opening the high energy can, a 31 joule shock was delivered after a rest of  five minutes, after induction of ventricular fibrillation, at a total  duration of 12 seconds.  It was delivered through a resistance of 43 ohms,  terminating ventricular fibrillation and restoring sinus rhythm.  At this  point it was elected to put in the high energy can.  The leads were then  attached to a Guidant renewal #HE high energy defibrillator model [id number],  serial [id number].  Through the device the bipolar P-wave was 6 mV with a pacing impedance of 517 and a threshold of 0.2 to 0.5. The  R-wave was 11.9  with an impedance of 595 and a threshold of 0.4 to 0.5, and the R-wave was  20.4 with an impedance of 577 and a threshold of 0.2 to 0.5.  Ventricular fibrillation was then induced using the T-wave shock via this  defibrillator.  After a total duration of 9 seconds, a 31 joule shock was  delivered through a measured resistance of 44 ohms.  Failure to terminate  ventricular fibrillation, and the patient was rescued externally.  Because of the prolonged nature of the procedure, with the change of the  cans, and having to redeploy the left ventricular lead on a number of  occasions, it was elected to terminate the procedure at this point, and to  program all of his outputs at maximum energy.  The pocket was copiously  irrigated with antibiotic-containing saline solution.  Hemostasis was  assured.  The leads in the pulse generator were placed in the pocket and  secured to the pre-pectoral  fascia.  The wound was then closed in three layers in the normal fashion.  The needle counts, sponge counts and instrument counts were correct at the  end of the procedure according to the staff.  The patient tolerated the procedure without apparent complications.       SCK/MEDQ  D:  10/27/2004  T:  10/27/2004  Job:  [id number]   cc:   Charlies Constable, M.D. [facility]   Electrophysiology Lab - [facility]   [facility]   Manson L. Philipp Deputy, M.D.  [phone] [address]  [place] [address]  Fax: [phone]

## 2011-05-11 ENCOUNTER — Other Ambulatory Visit: Payer: Self-pay | Admitting: Internal Medicine

## 2011-05-18 ENCOUNTER — Other Ambulatory Visit: Payer: Self-pay | Admitting: Internal Medicine

## 2011-05-20 ENCOUNTER — Encounter: Payer: Medicaid Other | Admitting: *Deleted

## 2011-05-21 IMAGING — CR DG CHEST 2V
2 series · 2 of 2 positions shown · non-contrast
Comparison: 08/05/2010

CLINICAL DATA: Pacemaker malfunction.  Status post pacemaker
placement.

CHEST - 2 VIEW

[w chest pa]
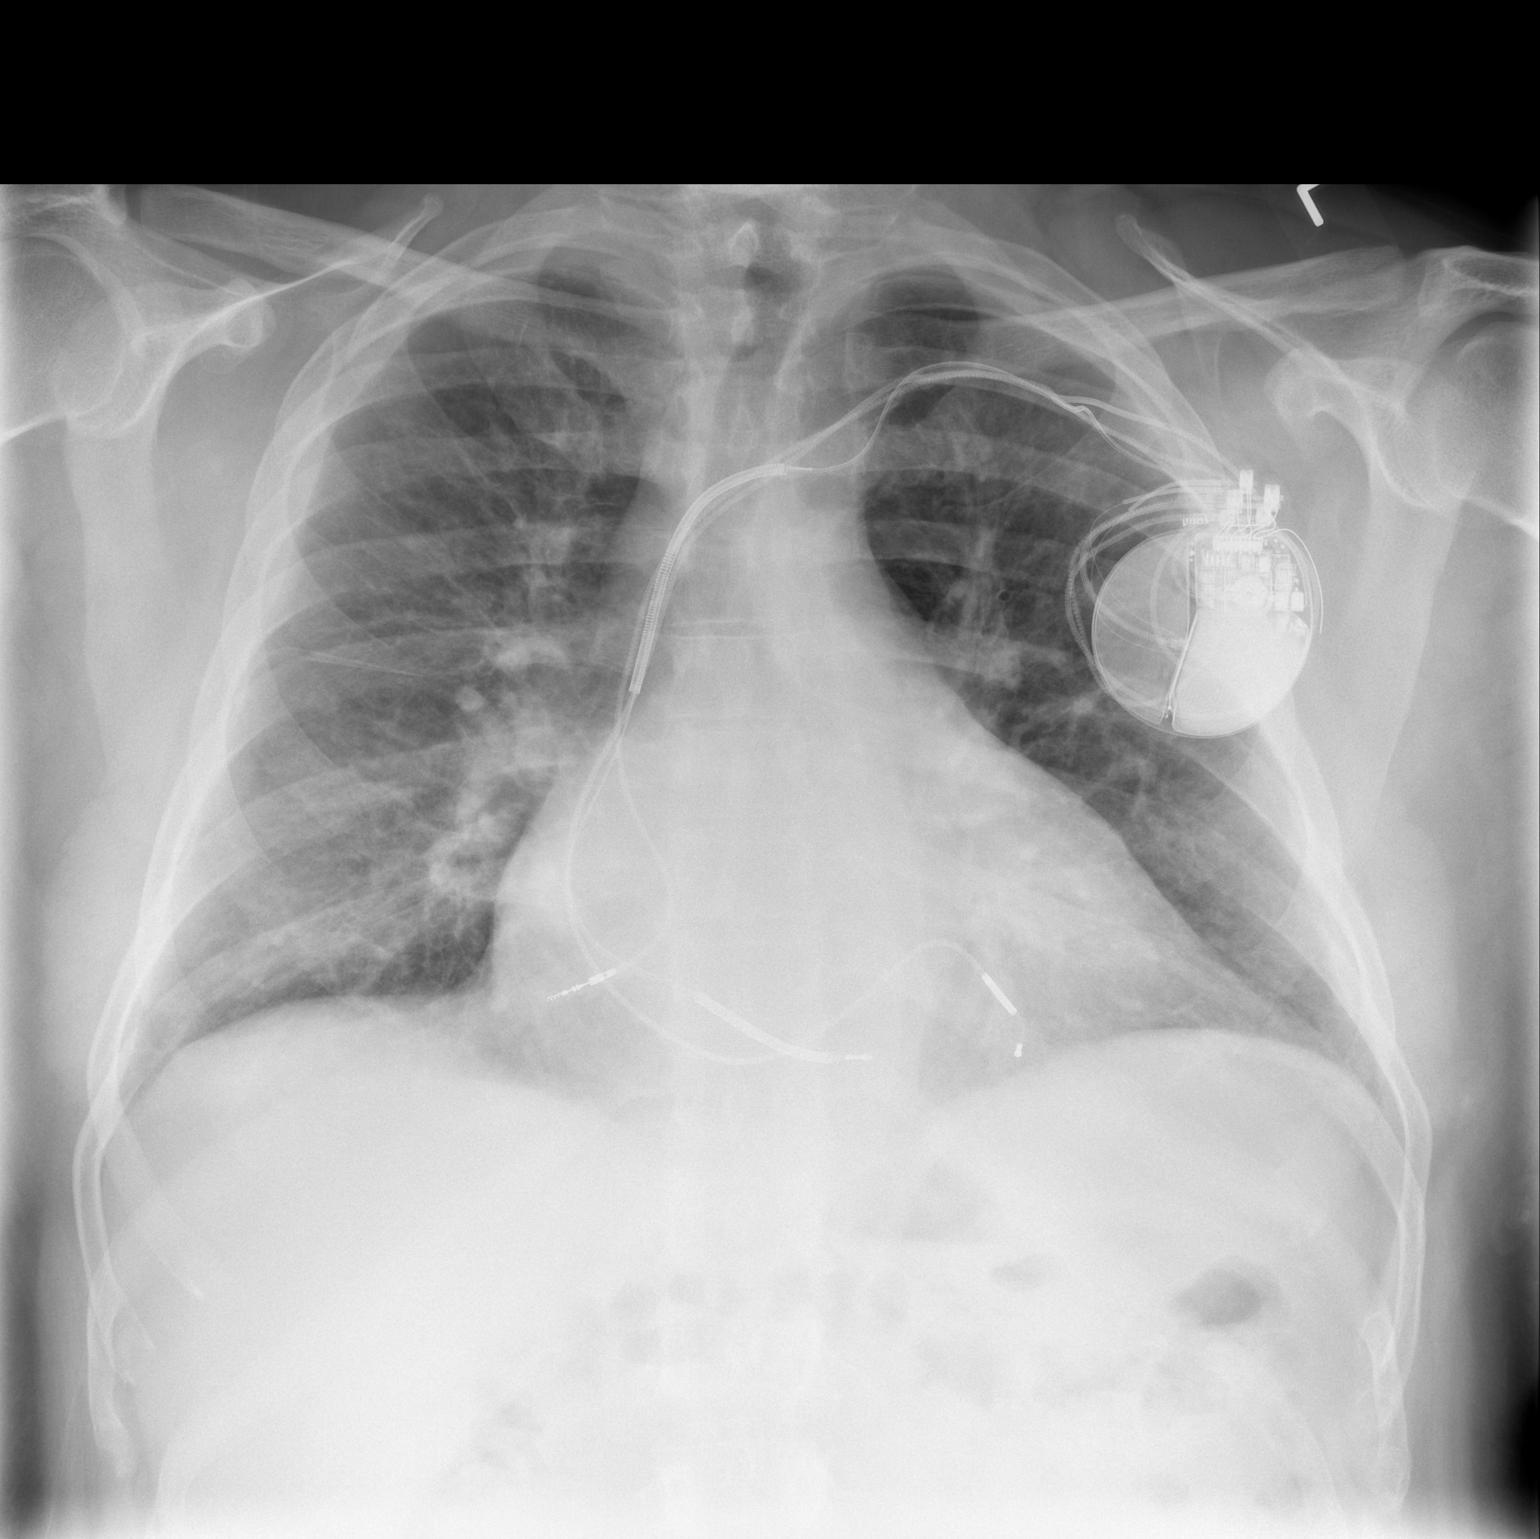

[w chest lat]
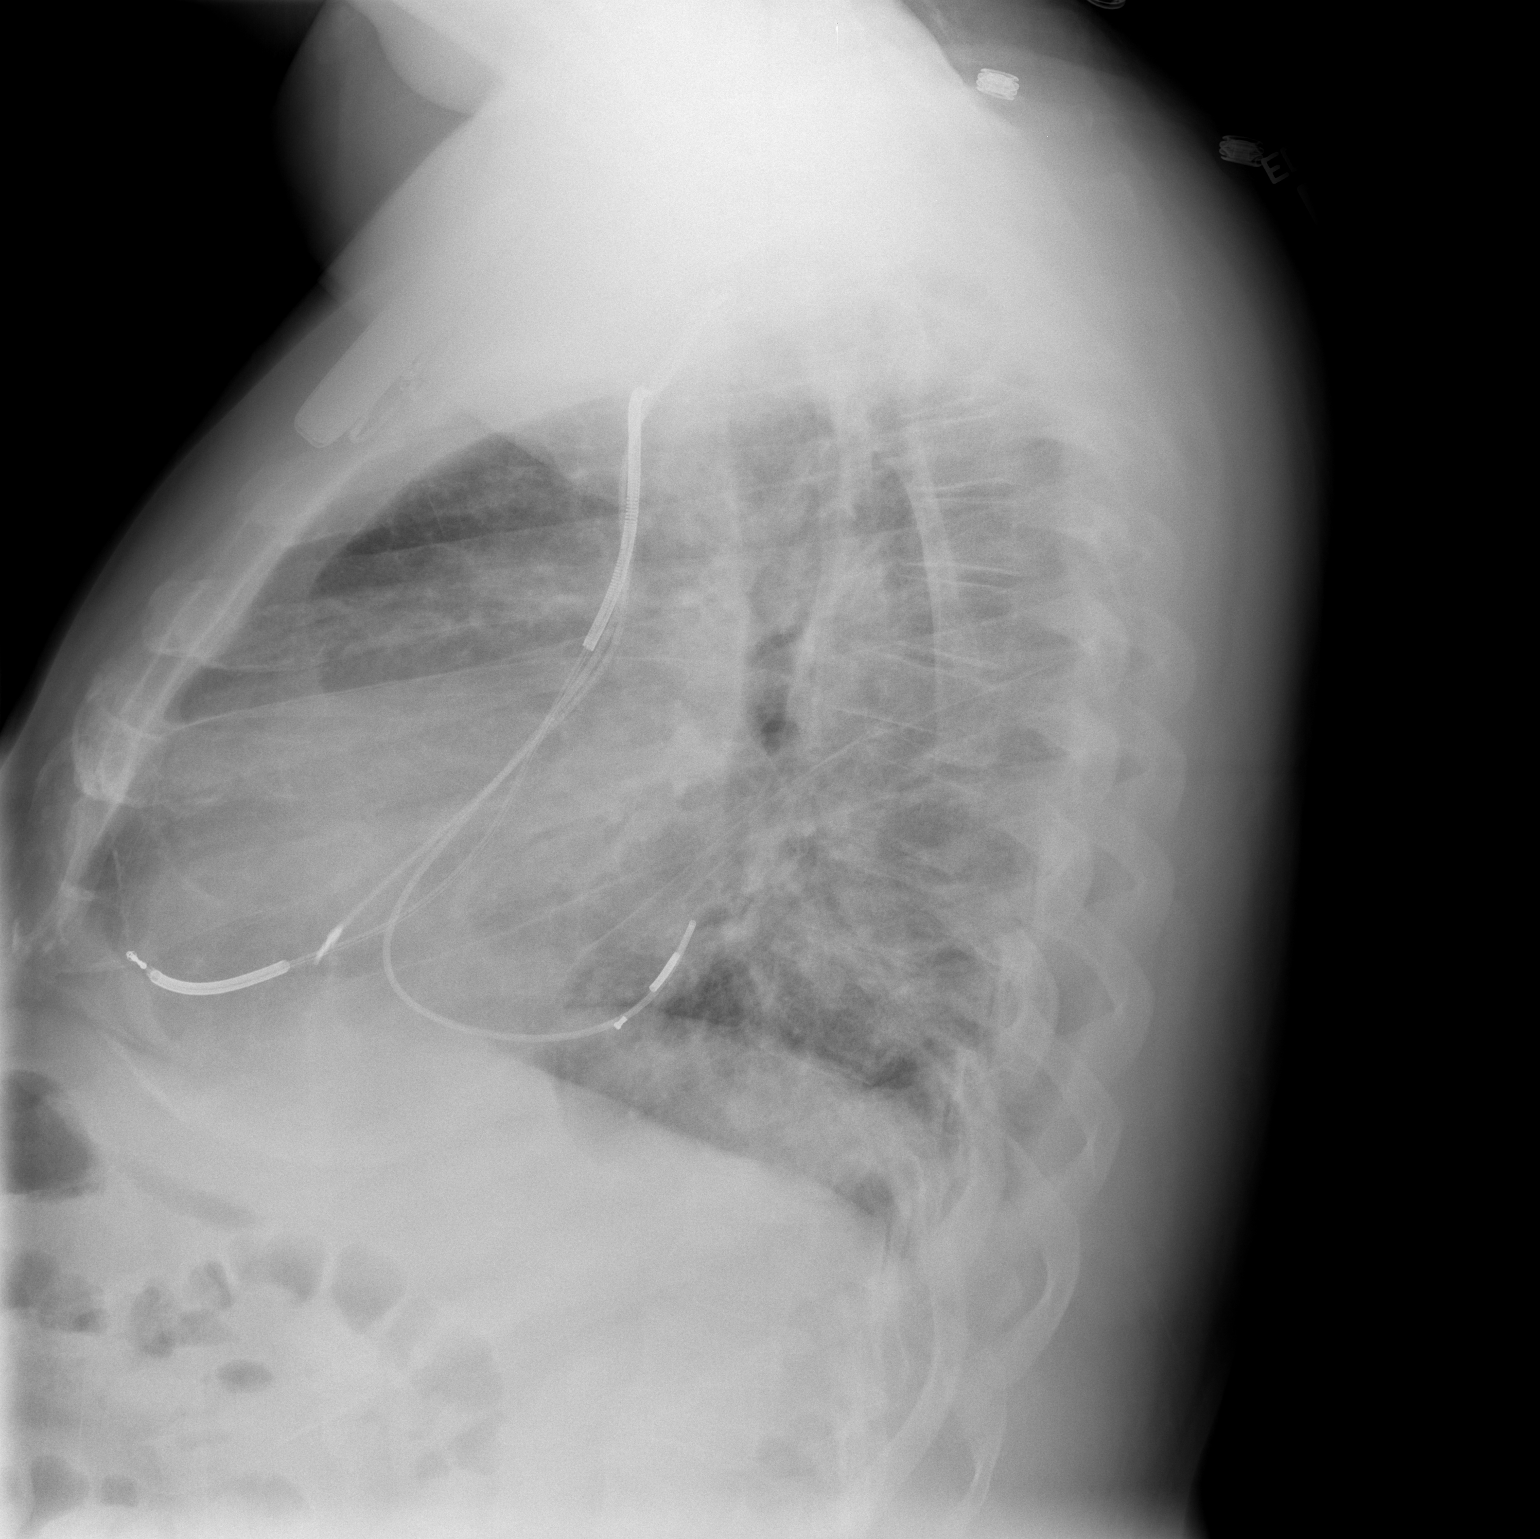

[2 of 2 positions shown; findings below may reference images not displayed]

FINDINGS: The new AICD generator seen.  Leads remain in appropriate
position right atrium, right ventricle and coronary sinus.

Mild cardiomegaly is stable.  No evidence of pulmonary infiltrate
or pleural effusion.  No evidence of pneumothorax
IMPRESSION: AICD in appropriate position.  No evidence of pneumothorax or other
acute findings.

## 2011-06-04 ENCOUNTER — Other Ambulatory Visit: Payer: Self-pay | Admitting: Internal Medicine

## 2011-06-07 ENCOUNTER — Ambulatory Visit (INDEPENDENT_AMBULATORY_CARE_PROVIDER_SITE_OTHER): Payer: Medicaid Other | Admitting: *Deleted

## 2011-06-07 DIAGNOSIS — I428 Other cardiomyopathies: Secondary | ICD-10-CM

## 2011-06-07 DIAGNOSIS — I5022 Chronic systolic (congestive) heart failure: Secondary | ICD-10-CM

## 2011-06-07 LAB — ICD DEVICE OBSERVATION
AL AMPLITUDE: 6 mv
AL THRESHOLD: 0.8 V
BAMS-0001: 170 {beats}/min
BAMS-0002: 8 ms
BAMS-0003: 70 {beats}/min
LV LEAD AMPLITUDE: 17.6 mv
RV LEAD AMPLITUDE: 12.2 mv
RV LEAD THRESHOLD: 0.7 V
TZAT-0001SLOWVT: 2
TZAT-0004FASTVT: 8
TZAT-0004SLOWVT: 8
TZAT-0005SLOWVT: 81 pct
TZAT-0012FASTVT: 200 ms
TZAT-0012SLOWVT: 200 ms
TZAT-0018FASTVT: NEGATIVE
TZAT-0018SLOWVT: NEGATIVE
TZAT-0018SLOWVT: NEGATIVE
TZON-0004FASTVT: 2.5
TZON-0005SLOWVT: 1
TZST-0001FASTVT: 6
TZST-0001FASTVT: 7
TZST-0001FASTVT: 8
TZST-0001SLOWVT: 5
TZST-0003FASTVT: 41 J
TZST-0003FASTVT: 41 J
TZST-0003FASTVT: 41 J
TZST-0003FASTVT: 41 J
TZST-0003SLOWVT: 41 J
TZST-0003SLOWVT: 41 J
TZST-0003SLOWVT: 41 J
TZST-0003SLOWVT: 41 J

## 2011-08-06 ENCOUNTER — Other Ambulatory Visit: Payer: Self-pay | Admitting: Internal Medicine

## 2011-09-03 ENCOUNTER — Encounter: Payer: Self-pay | Admitting: Internal Medicine

## 2011-09-07 ENCOUNTER — Encounter: Payer: Medicaid Other | Admitting: Internal Medicine

## 2011-09-16 LAB — CBC
HCT: 47.1
Hemoglobin: 15.7
Hemoglobin: 16
MCV: 93.2
Platelets: 150
RDW: 15.5 — ABNORMAL HIGH
WBC: 6.5

## 2011-09-16 LAB — POCT CARDIAC MARKERS: CKMB, poc: 1.9

## 2011-09-16 LAB — HEPARIN LEVEL (UNFRACTIONATED)
Heparin Unfractionated: 0.77 — ABNORMAL HIGH
Heparin Unfractionated: 0.78 — ABNORMAL HIGH

## 2011-09-16 LAB — I-STAT 8, (EC8 V) (CONVERTED LAB)
Acid-Base Excess: 4 — ABNORMAL HIGH
Bicarbonate: 29.2 — ABNORMAL HIGH
Chloride: 101
Hemoglobin: 17.3 — ABNORMAL HIGH
Operator id: 270651
TCO2: 30

## 2011-09-16 LAB — DIFFERENTIAL
Basophils Absolute: 0
Eosinophils Absolute: 0.2
Lymphocytes Relative: 50 — ABNORMAL HIGH
Lymphs Abs: 2.8
Monocytes Absolute: 0.5
Neutro Abs: 2.1

## 2011-09-16 LAB — COMPREHENSIVE METABOLIC PANEL
Alkaline Phosphatase: 53
BUN: 7
Chloride: 100
Creatinine, Ser: 1
GFR calc non Af Amer: >60
Glucose, Bld: 91
Potassium: 3.8
Total Bilirubin: 1.8 — ABNORMAL HIGH

## 2011-09-16 LAB — POCT I-STAT CREATININE: Creatinine, Ser: 1.1

## 2011-09-16 LAB — RAPID URINE DRUG SCREEN, HOSP PERFORMED
Benzodiazepines: NOT DETECTED
Cocaine: NOT DETECTED
Opiates: NOT DETECTED
Tetrahydrocannabinol: POSITIVE — AB

## 2011-09-16 LAB — LIPID PANEL
Cholesterol: 162
LDL Cholesterol: 101 — ABNORMAL HIGH

## 2011-09-16 LAB — CARDIAC PANEL(CRET KIN+CKTOT+MB+TROPI)
CK, MB: 2.6
Relative Index: 0.6
Relative Index: 0.7
Troponin I: 0.03

## 2011-09-16 LAB — APTT: aPTT: 30

## 2011-09-16 LAB — PROTIME-INR
INR: 1.1
Prothrombin Time: 13.9

## 2011-09-16 LAB — D-DIMER, QUANTITATIVE: D-Dimer, Quant: 0.52 — ABNORMAL HIGH

## 2011-10-18 ENCOUNTER — Other Ambulatory Visit: Payer: Self-pay | Admitting: Internal Medicine

## 2011-10-19 NOTE — Telephone Encounter (Signed)
Cannot find record of this patient taking this medication.  Please advise if I should fill this.  Thank you for your help.  Judithe Modest, CMA

## 2011-10-19 NOTE — Telephone Encounter (Signed)
I left a message for the patient to call about sotalol. He was started on Sotalol 80mg  bid at his d/c from [facility] in Sept 2011. At his f/u visit in the office, this was not listed on him medication list.

## 2011-10-22 NOTE — Telephone Encounter (Signed)
I left a message with the patient's family to have him call regarding his medication.

## 2011-11-09 ENCOUNTER — Encounter: Payer: Self-pay | Admitting: *Deleted

## 2011-11-09 ENCOUNTER — Telehealth: Payer: Self-pay | Admitting: *Deleted

## 2011-11-09 NOTE — Telephone Encounter (Signed)
Refill request received for this patient on 10/19/11. Reviewed by Judithe Modest, CMA- she could find no record of the patient taking this medication. I left a message on 10/19/11 with the person who answered at the patient's home number, to have him call to discuss this medication. I found where he was started on Sotalol 80mg  BID at his discharge from [facility] in September 2011. At his next follow up visit in our office, this was not listed on his medication list. I left a second message for him to call on 10/22/11. The patient has not returned either of my calls even during my absence all of last week. A letter has been mailed to the patient today to have him call.

## 2011-11-22 ENCOUNTER — Other Ambulatory Visit: Payer: Self-pay | Admitting: Internal Medicine

## 2011-12-10 ENCOUNTER — Encounter: Payer: Self-pay | Admitting: Internal Medicine

## 2011-12-10 ENCOUNTER — Ambulatory Visit (INDEPENDENT_AMBULATORY_CARE_PROVIDER_SITE_OTHER): Payer: Medicaid Other | Admitting: Internal Medicine

## 2011-12-10 DIAGNOSIS — I5022 Chronic systolic (congestive) heart failure: Secondary | ICD-10-CM

## 2011-12-10 DIAGNOSIS — I428 Other cardiomyopathies: Secondary | ICD-10-CM

## 2011-12-10 DIAGNOSIS — Z720 Tobacco use: Secondary | ICD-10-CM | POA: Insufficient documentation

## 2011-12-10 DIAGNOSIS — Z95810 Presence of automatic (implantable) cardiac defibrillator: Secondary | ICD-10-CM

## 2011-12-10 DIAGNOSIS — F17200 Nicotine dependence, unspecified, uncomplicated: Secondary | ICD-10-CM

## 2011-12-10 LAB — ICD DEVICE OBSERVATION
AL AMPLITUDE: 6 mv
AL IMPEDENCE ICD: 548 Ohm
AL THRESHOLD: 0.8 v
BAMS-0001: 170 {beats}/min
BAMS-0002: 8 ms
BAMS-0003: 70 {beats}/min
CHARGE TIME: 8.4 s
DEVICE MODEL ICD: 480425
HV IMPEDENCE: 63 Ohm
LV LEAD AMPLITUDE: 18.9 mv
LV LEAD IMPEDENCE ICD: 606 Ohm
LV LEAD THRESHOLD: 0.7 v
RV LEAD AMPLITUDE: 13.4 mv
RV LEAD IMPEDENCE ICD: 496 Ohm
RV LEAD THRESHOLD: 0.9 v
TZAT-0001FASTVT: 1
TZAT-0001FASTVT: 2
TZAT-0001SLOWVT: 1
TZAT-0001SLOWVT: 2
TZAT-0002FASTVT: NEGATIVE
TZAT-0004FASTVT: 8
TZAT-0004SLOWVT: 8
TZAT-0004SLOWVT: 8
TZAT-0005FASTVT: 81 pct
TZAT-0005SLOWVT: 81 pct
TZAT-0005SLOWVT: 81 pct
TZAT-0012FASTVT: 200 ms
TZAT-0012SLOWVT: 200 ms
TZAT-0012SLOWVT: 200 ms
TZAT-0013FASTVT: 1
TZAT-0013SLOWVT: 2
TZAT-0013SLOWVT: 2
TZAT-0018FASTVT: NEGATIVE
TZAT-0018FASTVT: NEGATIVE
TZAT-0018SLOWVT: NEGATIVE
TZAT-0018SLOWVT: NEGATIVE
TZAT-0019FASTVT: 5 v
TZAT-0019SLOWVT: 5 v
TZAT-0019SLOWVT: 5 v
TZAT-0020FASTVT: 1 ms
TZAT-0020SLOWVT: 1 ms
TZAT-0020SLOWVT: 1 ms
TZON-0003FASTVT: 286 ms
TZON-0003SLOWVT: 316 ms
TZON-0004FASTVT: 2.5
TZON-0004SLOWVT: 2.5
TZON-0005FASTVT: 1
TZON-0005SLOWVT: 1
TZST-0001FASTVT: 3
TZST-0001FASTVT: 4
TZST-0001FASTVT: 5
TZST-0001FASTVT: 6
TZST-0001FASTVT: 7
TZST-0001FASTVT: 8
TZST-0001SLOWVT: 3
TZST-0001SLOWVT: 4
TZST-0001SLOWVT: 5
TZST-0001SLOWVT: 6
TZST-0001SLOWVT: 7
TZST-0003FASTVT: 41 J
TZST-0003FASTVT: 41 J
TZST-0003FASTVT: 41 J
TZST-0003FASTVT: 41 J
TZST-0003FASTVT: 41 J
TZST-0003FASTVT: 41 J
TZST-0003SLOWVT: 41 J
TZST-0003SLOWVT: 41 J
TZST-0003SLOWVT: 41 J
TZST-0003SLOWVT: 41 J
TZST-0003SLOWVT: 41 J

## 2011-12-10 MED ORDER — NICOTINE 7 MG/24HR TD PT24: 1.0000 | MEDICATED_PATCH | TRANSDERMAL | Status: AC

## 2011-12-10 MED ORDER — NICOTINE 14 MG/24HR TD PT24: 1.0000 | MEDICATED_PATCH | TRANSDERMAL | Status: AC

## 2011-12-10 MED ORDER — NICOTINE 21 MG/24HR TD PT24: 1.0000 | MEDICATED_PATCH | TRANSDERMAL | Status: AC

## 2011-12-10 NOTE — Assessment & Plan Note (Signed)
Stable on current meds 

## 2011-12-10 NOTE — Assessment & Plan Note (Signed)
We have given her a prescription today for transdermal patches. He is interested in stopping smoking. We have also given referral to the [facility] long stopping program as well as the one 800-STOP NOW

## 2011-12-10 NOTE — Progress Notes (Signed)
  HPI  Spencer Robertson is a 51 y.o. male is seen today in followup for nonischemic cardiomyopathycongestive heart failure and left bundle branch block, status post CRT-D implantation originally in November 2005 with change out September 2011. The procedure was complicated by high DFT and he was started on sotalol with repeat testing demonstrating adequate defibrillation margin   The patient denies , chest pain, edema or palpitations; shortness of breath is stable he does note some episodes of abdominal distention associated with hand edema and foot edema. These are not infrequent associated with excessive salt intake   He is tolerating his medications well. He has gotten his Medicaid card   He wants to stop smoking.  Past Medical History  Diagnosis Date  . Hypertension   . Nonischemic cardiomyopathy   . Mitral regurgitation   . Hyperlipidemia   . Tobacco abuse   . S/P implantation of automatic cardioverter/defibrillator (AICD)     Guidant Contact H177  . OSA (obstructive sleep apnea)     severe  . Obesity     Past Surgical History  Procedure Date  . Cardiac defibrillator placement     Guidant Contak H177 device     Current Outpatient Prescriptions  Medication Sig Dispense Refill  . aspirin 81 MG tablet Take 81 mg by mouth daily.        . carvedilol (COREG) 25 MG tablet take 1 tablet by mouth twice a day  60 tablet  6  . fosinopril (MONOPRIL) 20 MG tablet take 1 tablet by mouth once daily  30 tablet  5  . furosemide (LASIX) 20 MG tablet        . SOTALOL AF 80 MG TABS take 1 tablet by mouth twice a day  60 tablet  6  . spironolactone (ALDACTONE) 50 MG tablet take 1/2 tablet by mouth once daily  15 tablet  11    No Known Allergies  Review of Systems negative except from HPI and PMH  Physical Exam Well developed and well nourished obese in no acute distress HENT normal E scleral and icterus clear Neck Supple Clear to ausculation regular rate and rhythm Regular rate  and rhythm, no murmurs gallops or rub Soft with active bowel sounds No clubbing cyanosis none Edema Alert and oriented, grossly normal motor and sensory function Skin Warm and Dry   Assessment and  Plan

## 2011-12-10 NOTE — Patient Instructions (Signed)
Your physician has recommended you make the following change in your medication:  1) Start nicoderm patches as directed.  Your physician recommends that you have lab work today: bmp  Remote monitoring is used to monitor your Pacemaker of ICD from home. This monitoring reduces the number of office visits required to check your device to one time per year. It allows [place] to keep an eye on the functioning of your device to ensure it is working properly. You are scheduled for a device check from home on 03/09/12. You may send your transmission at any time that day. If you have a wireless device, the transmission will be sent automatically. After your physician reviews your transmission, you will receive a postcard with your next transmission date.  Your physician wants you to follow-up in: 1 year with Dr. Graciela Husbands. You will receive a reminder letter in the mail two months in advance. If you don't receive a letter, please call our office to schedule the follow-up appointment.

## 2011-12-16 ENCOUNTER — Other Ambulatory Visit: Payer: Medicaid Other | Admitting: *Deleted

## 2011-12-28 ENCOUNTER — Encounter: Payer: Self-pay | Admitting: *Deleted

## 2012-01-14 ENCOUNTER — Other Ambulatory Visit: Payer: Medicaid Other | Admitting: *Deleted

## 2012-01-26 ENCOUNTER — Other Ambulatory Visit: Payer: Self-pay | Admitting: Internal Medicine

## 2012-01-26 DIAGNOSIS — I5022 Chronic systolic (congestive) heart failure: Secondary | ICD-10-CM

## 2012-01-26 DIAGNOSIS — I420 Dilated cardiomyopathy: Secondary | ICD-10-CM

## 2012-01-26 NOTE — Telephone Encounter (Signed)
New Msg: pt calling wanting to speak with nurse regarding Medicaid no longer paying for pt medication-pt doesn't know the name of which medication. Pt stated he needs refill of all medications.   Please return pt call to discuss further.

## 2012-01-28 NOTE — Telephone Encounter (Signed)
I left a message for the patient to call. 

## 2012-02-04 NOTE — Telephone Encounter (Signed)
I left a message with the patient's family to have him call.

## 2012-02-08 ENCOUNTER — Encounter: Payer: Self-pay | Admitting: *Deleted

## 2012-02-08 MED ORDER — SPIRONOLACTONE 50 MG PO TABS: ORAL_TABLET | ORAL | Status: DC

## 2012-02-08 MED ORDER — FOSINOPRIL SODIUM 20 MG PO TABS: 20.0000 mg | ORAL_TABLET | Freq: Every day | ORAL | Status: DC

## 2012-02-08 MED ORDER — SOTALOL HCL (AF) 80 MG PO TABS: 1.0000 | ORAL_TABLET | Freq: Two times a day (BID) | ORAL | Status: DC

## 2012-02-08 MED ORDER — FUROSEMIDE 20 MG PO TABS: 20.0000 mg | ORAL_TABLET | Freq: Every day | ORAL | Status: DC

## 2012-02-08 MED ORDER — CARVEDILOL 25 MG PO TABS: 25.0000 mg | ORAL_TABLET | Freq: Two times a day (BID) | ORAL | Status: DC

## 2012-02-08 NOTE — Telephone Encounter (Signed)
Letter mailed to the patient

## 2012-03-09 ENCOUNTER — Encounter: Payer: Medicaid Other | Admitting: *Deleted

## 2012-03-16 ENCOUNTER — Encounter: Payer: Self-pay | Admitting: *Deleted

## 2012-03-20 ENCOUNTER — Encounter: Payer: Self-pay | Admitting: Internal Medicine

## 2012-03-20 ENCOUNTER — Ambulatory Visit (INDEPENDENT_AMBULATORY_CARE_PROVIDER_SITE_OTHER): Payer: Medicaid Other | Admitting: *Deleted

## 2012-03-20 DIAGNOSIS — I428 Other cardiomyopathies: Secondary | ICD-10-CM

## 2012-03-20 DIAGNOSIS — I5022 Chronic systolic (congestive) heart failure: Secondary | ICD-10-CM

## 2012-03-20 DIAGNOSIS — Z95810 Presence of automatic (implantable) cardiac defibrillator: Secondary | ICD-10-CM

## 2012-03-20 LAB — ICD DEVICE OBSERVATION
AL AMPLITUDE: 7.4 mv
ATRIAL PACING ICD: 10 pct
BAMS-0002: 8 ms
BAMS-0003: 70 {beats}/min
DEVICE MODEL ICD: 480425
HV IMPEDENCE: 62 Ohm
RV LEAD IMPEDENCE ICD: 485 Ohm
TZAT-0001FASTVT: 2
TZAT-0001SLOWVT: 1
TZAT-0001SLOWVT: 2
TZAT-0004SLOWVT: 8
TZAT-0004SLOWVT: 8
TZAT-0005SLOWVT: 81 pct
TZAT-0012FASTVT: 200 ms
TZAT-0013FASTVT: 1
TZAT-0013SLOWVT: 2
TZAT-0018FASTVT: NEGATIVE
TZAT-0018SLOWVT: NEGATIVE
TZAT-0020FASTVT: 1 ms
TZAT-0020SLOWVT: 1 ms
TZON-0004FASTVT: 2.5
TZON-0004SLOWVT: 2.5
TZON-0005FASTVT: 1
TZON-0005SLOWVT: 1
TZST-0001FASTVT: 3
TZST-0001FASTVT: 5
TZST-0001FASTVT: 6
TZST-0001FASTVT: 8
TZST-0001SLOWVT: 3
TZST-0001SLOWVT: 4
TZST-0001SLOWVT: 6
TZST-0001SLOWVT: 7
TZST-0003FASTVT: 41 J
TZST-0003FASTVT: 41 J
TZST-0003FASTVT: 41 J
TZST-0003SLOWVT: 41 J
TZST-0003SLOWVT: 41 J
TZST-0003SLOWVT: 41 J
VENTRICULAR PACING ICD: 97 pct

## 2012-03-20 NOTE — Progress Notes (Signed)
Pt seen in clinic for follow up of ICD.  No complaints of chest pain, shortness of breath, dizziness, palpitations, or shocks.  Device functioning normally at this time.  For full details, see PaceArt report.  No programming changes made today.  Plan to follow up in 3 months with Latitude and in January of 2014 with Dr Graciela Husbands.   Gypsy Balsam, RN, BSN 03/20/2012 2:51 PM

## 2012-06-01 ENCOUNTER — Encounter: Payer: Self-pay | Admitting: Internal Medicine

## 2012-06-01 ENCOUNTER — Ambulatory Visit (INDEPENDENT_AMBULATORY_CARE_PROVIDER_SITE_OTHER): Payer: Medicaid Other | Admitting: *Deleted

## 2012-06-01 DIAGNOSIS — I428 Other cardiomyopathies: Secondary | ICD-10-CM

## 2012-06-01 DIAGNOSIS — I5022 Chronic systolic (congestive) heart failure: Secondary | ICD-10-CM

## 2012-06-01 LAB — ICD DEVICE OBSERVATION
AL THRESHOLD: 0.9 V
BAMS-0001: 170 {beats}/min
BAMS-0002: 8 ms
BAMS-0003: 70 {beats}/min
DEVICE MODEL ICD: 480425
HV IMPEDENCE: 67 Ohm
LV LEAD IMPEDENCE ICD: 611 Ohm
RV LEAD IMPEDENCE ICD: 517 Ohm
RV LEAD THRESHOLD: 0.8 V
TZAT-0004FASTVT: 8
TZAT-0005FASTVT: 81 pct
TZAT-0012FASTVT: 200 ms
TZAT-0012SLOWVT: 200 ms
TZAT-0013SLOWVT: 2
TZAT-0013SLOWVT: 2
TZAT-0018FASTVT: NEGATIVE
TZAT-0018SLOWVT: NEGATIVE
TZAT-0019SLOWVT: 5 V
TZAT-0019SLOWVT: 5 V
TZAT-0020FASTVT: 1 ms
TZAT-0020SLOWVT: 1 ms
TZAT-0020SLOWVT: 1 ms
TZON-0003SLOWVT: 316 ms
TZON-0004SLOWVT: 2.5
TZST-0001FASTVT: 3
TZST-0001FASTVT: 5
TZST-0001FASTVT: 7
TZST-0001SLOWVT: 3
TZST-0001SLOWVT: 5
TZST-0003FASTVT: 41 J
TZST-0003FASTVT: 41 J
TZST-0003SLOWVT: 41 J
TZST-0003SLOWVT: 41 J

## 2012-06-01 NOTE — Progress Notes (Signed)
ICD check 

## 2012-08-08 ENCOUNTER — Encounter: Payer: Medicaid Other | Admitting: Cardiology

## 2012-09-06 ENCOUNTER — Encounter: Payer: Self-pay | Admitting: Internal Medicine

## 2012-09-06 ENCOUNTER — Ambulatory Visit (INDEPENDENT_AMBULATORY_CARE_PROVIDER_SITE_OTHER): Payer: Medicaid Other | Admitting: *Deleted

## 2012-09-06 DIAGNOSIS — I428 Other cardiomyopathies: Secondary | ICD-10-CM

## 2012-09-06 DIAGNOSIS — I5022 Chronic systolic (congestive) heart failure: Secondary | ICD-10-CM

## 2012-09-06 LAB — ICD DEVICE OBSERVATION
ATRIAL PACING ICD: 13 pct
BAMS-0002: 8 ms
BAMS-0003: 70 {beats}/min
DEVICE MODEL ICD: 480425
LV LEAD IMPEDENCE ICD: 646 Ohm
TZAT-0001FASTVT: 2
TZAT-0001SLOWVT: 1
TZAT-0001SLOWVT: 2
TZAT-0004FASTVT: 8
TZAT-0004SLOWVT: 8
TZAT-0004SLOWVT: 8
TZAT-0005FASTVT: 81 pct
TZAT-0005SLOWVT: 81 pct
TZAT-0012FASTVT: 200 ms
TZAT-0013FASTVT: 1
TZAT-0013SLOWVT: 2
TZAT-0018FASTVT: NEGATIVE
TZAT-0018FASTVT: NEGATIVE
TZAT-0018SLOWVT: NEGATIVE
TZAT-0018SLOWVT: NEGATIVE
TZAT-0019SLOWVT: 5 V
TZAT-0020SLOWVT: 1 ms
TZON-0004SLOWVT: 2.5
TZON-0005FASTVT: 1
TZON-0005SLOWVT: 1
TZST-0001FASTVT: 3
TZST-0001FASTVT: 5
TZST-0001FASTVT: 6
TZST-0001FASTVT: 8
TZST-0001SLOWVT: 3
TZST-0001SLOWVT: 4
TZST-0001SLOWVT: 5
TZST-0001SLOWVT: 7
TZST-0003FASTVT: 41 J
TZST-0003FASTVT: 41 J
TZST-0003FASTVT: 41 J
TZST-0003SLOWVT: 41 J
TZST-0003SLOWVT: 41 J
TZST-0003SLOWVT: 41 J

## 2012-09-06 NOTE — Progress Notes (Signed)
ICD check 

## 2012-10-05 ENCOUNTER — Other Ambulatory Visit: Payer: Self-pay | Admitting: Internal Medicine

## 2012-11-10 ENCOUNTER — Telehealth: Payer: Self-pay | Admitting: Internal Medicine

## 2012-11-10 NOTE — Telephone Encounter (Signed)
Left message, patient not available.

## 2012-11-10 NOTE — Telephone Encounter (Signed)
New Problem:    Patient called in wanting to know about his transmissions.  Please call back.

## 2012-11-13 ENCOUNTER — Telehealth: Payer: Self-pay | Admitting: *Deleted

## 2012-11-13 NOTE — Telephone Encounter (Signed)
Patient left message stating he had hooked up Latitude communicator for remote monitoring of ICD from home.  On Latitude website, status is monitored.  Pt aware we should be receiving alerts if there are any.  Pt aware of appt in January.

## 2012-11-14 ENCOUNTER — Other Ambulatory Visit: Payer: Self-pay | Admitting: *Deleted

## 2012-11-14 DIAGNOSIS — I5022 Chronic systolic (congestive) heart failure: Secondary | ICD-10-CM

## 2012-11-14 DIAGNOSIS — I420 Dilated cardiomyopathy: Secondary | ICD-10-CM

## 2012-11-14 MED ORDER — FUROSEMIDE 20 MG PO TABS: 20.0000 mg | ORAL_TABLET | Freq: Every day | ORAL | Status: DC

## 2012-12-08 ENCOUNTER — Telehealth: Payer: Self-pay | Admitting: Internal Medicine

## 2012-12-08 NOTE — Telephone Encounter (Signed)
Spoke w/family member. It was recording reminding pt of appt with SK on 12-12-12/kwm

## 2012-12-08 NOTE — Telephone Encounter (Signed)
New Problem:    Patient called in returning a call from yesterday.  Patient was not home and is unsure who the call was from.  Patient is certain that it may have been a nurse who checks his device.  Please call back.

## 2012-12-08 NOTE — Telephone Encounter (Signed)
Will forward to the device clinic 

## 2012-12-12 ENCOUNTER — Encounter: Payer: Self-pay | Admitting: Internal Medicine

## 2012-12-12 ENCOUNTER — Telehealth: Payer: Self-pay | Admitting: Pulmonary Disease

## 2012-12-12 ENCOUNTER — Ambulatory Visit (INDEPENDENT_AMBULATORY_CARE_PROVIDER_SITE_OTHER): Payer: Medicaid Other | Admitting: Internal Medicine

## 2012-12-12 VITALS — BP 121/68 | HR 78 | Resp 18 | Wt 288.0 lb

## 2012-12-12 DIAGNOSIS — I428 Other cardiomyopathies: Secondary | ICD-10-CM

## 2012-12-12 DIAGNOSIS — F17200 Nicotine dependence, unspecified, uncomplicated: Secondary | ICD-10-CM

## 2012-12-12 DIAGNOSIS — I5022 Chronic systolic (congestive) heart failure: Secondary | ICD-10-CM

## 2012-12-12 DIAGNOSIS — G4730 Sleep apnea, unspecified: Secondary | ICD-10-CM

## 2012-12-12 DIAGNOSIS — Z95810 Presence of automatic (implantable) cardiac defibrillator: Secondary | ICD-10-CM

## 2012-12-12 DIAGNOSIS — Z720 Tobacco use: Secondary | ICD-10-CM

## 2012-12-12 LAB — ICD DEVICE OBSERVATION
AL AMPLITUDE: 6.2 mv
AL IMPEDENCE ICD: 590 Ohm
BAMS-0001: 170 {beats}/min
BAMS-0002: 8 ms
BAMS-0003: 70 {beats}/min
HV IMPEDENCE: 65 Ohm
LV LEAD IMPEDENCE ICD: 657 Ohm
RV LEAD IMPEDENCE ICD: 507 Ohm
TZAT-0001SLOWVT: 1
TZAT-0005FASTVT: 81 pct
TZAT-0012FASTVT: 200 ms
TZAT-0012SLOWVT: 200 ms
TZAT-0013FASTVT: 1
TZAT-0013SLOWVT: 2
TZAT-0018FASTVT: NEGATIVE
TZAT-0019FASTVT: 5 V
TZAT-0019SLOWVT: 5 V
TZAT-0020SLOWVT: 1 ms
TZAT-0020SLOWVT: 1 ms
TZON-0003FASTVT: 286 ms
TZON-0003SLOWVT: 316 ms
TZON-0004SLOWVT: 2.5
TZON-0005SLOWVT: 1
TZST-0001FASTVT: 4
TZST-0001FASTVT: 5
TZST-0001FASTVT: 7
TZST-0001SLOWVT: 3
TZST-0001SLOWVT: 5
TZST-0001SLOWVT: 7
TZST-0003FASTVT: 41 J
TZST-0003FASTVT: 41 J
TZST-0003FASTVT: 41 J
TZST-0003SLOWVT: 41 J
TZST-0003SLOWVT: 41 J

## 2012-12-12 LAB — BASIC METABOLIC PANEL
BUN: 11 mg/dL (ref 6–23)
Calcium: 9.2 mg/dL (ref 8.4–10.5)
Creatinine, Ser: 1 mg/dL (ref 0.4–1.5)
GFR: 106.32 mL/min (ref >60.00)
Glucose, Bld: 94 mg/dL (ref 70–99)
Sodium: 136 mEq/L (ref 135–145)

## 2012-12-12 NOTE — Telephone Encounter (Signed)
Pt needs ov to f/u osa.  Has not f/u and cardiology tells me having issues.

## 2012-12-12 NOTE — Patient Instructions (Signed)
Your physician recommends that you have lab work today: bmp  Your physician wants you to follow-up in: 1 year with Dr. Graciela Husbands. You will receive a reminder letter in the mail two months in advance. If you don't receive a letter, please call our office to schedule the follow-up appointment.

## 2012-12-12 NOTE — Assessment & Plan Note (Signed)
The patient's device was interrogated.  The information was reviewed. No changes were made in the programming.    

## 2012-12-12 NOTE — Assessment & Plan Note (Signed)
Previous abnormal sleep study. The last Dr. Greggory Keen  is about how it is that we released it as he is not tolerating the mask

## 2012-12-12 NOTE — Assessment & Plan Note (Signed)
Stable on current Guideline directed medical therapy Will check BMET

## 2012-12-12 NOTE — Assessment & Plan Note (Signed)
Still smoking and trying to quit

## 2012-12-12 NOTE — Progress Notes (Signed)
[facility] Patient Care Team: Doe-Hyun Sherran Needs, DO as PCP - General   HPI  Spencer Robertson is a 52 y.o. male  is seen today in followup for nonischemic cardiomyopathy with congestive heart failure and left bundle branch block, and status post CRT-D implantation originally in November 2005 with change out September 2011. The procedure was complicated by high DFT and he was started on sotalol with repeat testing demonstrating adequate defibrillation margins.   The patient denies , chest pain, edema or palpitations; shortness of breath is stable he does note some episodes of abdominal distention associated with hand edema and foot edema. These are not infrequent associated with excessive salt intake  He is tolerating his medications well. He has gotten his Medicaid card    Not using CPAP  Still tryiing to quit smoking  Past Medical History  Diagnosis Date  . Hypertension   . Nonischemic cardiomyopathy   . Mitral regurgitation   . Hyperlipidemia   . Tobacco abuse   . S/P implantation of automatic cardioverter/defibrillator (AICD)     Guidant Contact H177  . OSA (obstructive sleep apnea)     severe  . Obesity     Past Surgical History  Procedure Date  . Cardiac defibrillator placement     Guidant Contak H177 device     Current Outpatient Prescriptions  Medication Sig Dispense Refill  . aspirin 81 MG tablet Take 81 mg by mouth daily.        . carvedilol (COREG) 25 MG tablet Take 1 tablet (25 mg total) by mouth 2 (two) times daily with a meal.  60 tablet  6  . fosinopril (MONOPRIL) 20 MG tablet take 1 tablet by mouth once daily  30 tablet  6  . furosemide (LASIX) 20 MG tablet Take 1 tablet (20 mg total) by mouth daily.  30 tablet  1  . sotalol (BETAPACE) 80 MG tablet take 1 tablet by mouth twice a day  60 tablet  4  . SOTALOL AF 80 MG TABS Take 1 tablet (80 mg total) by mouth 2 (two) times daily.  60 tablet  2  . spironolactone (ALDACTONE) 50 MG tablet Take 1/2 tablet by mouth once  daily  15 tablet  6    No Known Allergies  Review of Systems negative except from HPI and PMH  Physical Exam vacation BP 121/68  Pulse 78  Resp 18  Wt 288 lb (130.636 kg) Well developed and well nourished in no acute distress HENT normal E scleral and icterus clear Neck Supple JVP flat; carotids brisk and full Clear to ausculation  Regular rate and rhythm, no murmurs gallops or rub Soft with active bowel sounds No clubbing cyanosis none Edema Alert and oriented, grossly normal motor and sensory function Skin Warm and Dry  ECG demonstrates P. Synchronous BiV. pacing  Assessment and  Plan

## 2012-12-12 NOTE — Telephone Encounter (Signed)
Pt needs OV to f/u sleep apnea.  He has not been seen in awhile, and is having issues per cardiology.  Next available is fine.

## 2012-12-12 NOTE — Progress Notes (Signed)
Spencer Robertson, the pt has obviously been noncompliant with f/u.  I am more than happy to work with him on mask issues and "tuneup".  Will have him come in for OV.

## 2012-12-12 NOTE — Assessment & Plan Note (Signed)
Euvolemic. Will continue current meds

## 2012-12-13 NOTE — Telephone Encounter (Signed)
Left msg with pt's sister and she will have him call once he returns home.

## 2012-12-14 ENCOUNTER — Telehealth: Payer: Self-pay | Admitting: Internal Medicine

## 2012-12-14 NOTE — Telephone Encounter (Signed)
Follow-up:    Patient's sister called in for him, then subsequently placed him on the phone, and stated that it was Lawson Fiscal who called the patient.  Please call back.

## 2012-12-14 NOTE — Telephone Encounter (Signed)
Pt aware of lab results. Pt verbalized understanding.   

## 2012-12-14 NOTE — Telephone Encounter (Signed)
New Problem:    Patient called in returning a call form Vernona Rieger.  Please call back.

## 2012-12-15 NOTE — Telephone Encounter (Signed)
Pt aware and has been scheduled for 12/26/12 @ 2:15 with KC to discuss sleep issues. Pt last seen 04/07/2010.

## 2012-12-26 ENCOUNTER — Ambulatory Visit: Payer: Medicaid Other | Admitting: Pulmonary Disease

## 2013-01-04 ENCOUNTER — Telehealth: Payer: Self-pay | Admitting: Internal Medicine

## 2013-01-04 NOTE — Telephone Encounter (Signed)
New Problem    Pt states he has the connections set up and needs to speak to someone about transmissions.

## 2013-01-04 NOTE — Telephone Encounter (Signed)
Spoke w/pt and explained to press blue button and send transmission every Thursday.

## 2013-01-08 ENCOUNTER — Ambulatory Visit: Payer: Medicaid Other | Admitting: Pulmonary Disease

## 2013-01-15 ENCOUNTER — Ambulatory Visit (INDEPENDENT_AMBULATORY_CARE_PROVIDER_SITE_OTHER): Payer: Medicaid Other | Admitting: Pulmonary Disease

## 2013-01-15 ENCOUNTER — Encounter: Payer: Self-pay | Admitting: Pulmonary Disease

## 2013-01-15 VITALS — BP 122/86 | HR 75 | Temp 97.7°F | Ht 71.0 in | Wt 291.0 lb

## 2013-01-15 DIAGNOSIS — G4733 Obstructive sleep apnea (adult) (pediatric): Secondary | ICD-10-CM

## 2013-01-15 MED ORDER — MOMETASONE FUROATE 50 MCG/ACT NA SUSP: NASAL | Status: DC

## 2013-01-15 NOTE — Patient Instructions (Addendum)
Will start back on nasonex nasal spray, 2 each nostril each am everyday.  Will give you a prescription for this. Will refer you to the sleep center to be fitted with a new mask Will get your equipment company to check your machine and make sure functioning properly. Make sure and use your heated humidifier, since this can help with nasal congestion.  Work on weight loss followup with me in 8 weeks, but please call if you are having issues tolerating cpap so we can discuss.

## 2013-01-15 NOTE — Progress Notes (Signed)
  Subjective:    Patient ID: Spencer Robertson, male    DOB: February 07, 1961, 52 y.o.   MRN: [id number]  HPI Patient comes in today for followup of his obstructive sleep apnea.  He was diagnosed with severe sleep apnea in 2005, and last seen in 2011.  He quit using CPAP because of issues with his mask fit, and as well as ongoing nasal congestion.  My last visit with him, I started him on a nasal steroid, and he tells me this did very well.  However, he did not call for a prescription.  He continues to have significant symptoms related to sleep apnea, and also has an underlying cardiomyopathy.   Review of Systems  Constitutional: Negative for fever and unexpected weight change.  HENT: Negative for ear pain, nosebleeds, congestion, sore throat, rhinorrhea, sneezing, trouble swallowing, dental problem, postnasal drip and sinus pressure.   Eyes: Negative for redness and itching.  Respiratory: Negative for cough, chest tightness, shortness of breath and wheezing.   Cardiovascular: Negative for palpitations and leg swelling.  Gastrointestinal: Negative for nausea and vomiting.  Genitourinary: Negative for dysuria.  Musculoskeletal: Negative for joint swelling.  Skin: Negative for rash.  Neurological: Negative for headaches.  Hematological: Does not bruise/bleed easily.  Psychiatric/Behavioral: Negative for dysphoric mood. The patient is not nervous/anxious.        Objective:   Physical Exam Obese male in no acute distress Nose without purulence or discharge, large turbinates with edema and erythema Oropharynx with elongation of soft palate and uvula Neck without lymphadenopathy or thyromegaly Chest with clear breath sounds bilaterally Cardiac exam with regular rhythm Lower extremities with mild edema, no cyanosis Alert and oriented, moves all 4 extremities.       Assessment & Plan:

## 2013-01-15 NOTE — Assessment & Plan Note (Signed)
The patient has a history of severe obstructive sleep apnea, and unfortunately he is not on CPAP at this time.  I have stressed to him the importance of treatment, especially with his underlying cardiomyopathy.  We can work with him on a better fitting mask, especially with some of the newer technology.  We'll also have his medical equipment Company check his machine to make sure that it is in working order.  Finally, I have stressed to him the importance of aggressive weight loss.

## 2013-01-22 ENCOUNTER — Other Ambulatory Visit (HOSPITAL_BASED_OUTPATIENT_CLINIC_OR_DEPARTMENT_OTHER): Payer: Medicaid Other

## 2013-01-23 ENCOUNTER — Telehealth: Payer: Self-pay | Admitting: Internal Medicine

## 2013-01-23 DIAGNOSIS — I5022 Chronic systolic (congestive) heart failure: Secondary | ICD-10-CM

## 2013-01-23 DIAGNOSIS — I420 Dilated cardiomyopathy: Secondary | ICD-10-CM

## 2013-01-23 MED ORDER — FUROSEMIDE 20 MG PO TABS: 20.0000 mg | ORAL_TABLET | Freq: Every day | ORAL | Status: DC

## 2013-01-23 MED ORDER — SPIRONOLACTONE 50 MG PO TABS: ORAL_TABLET | ORAL | Status: DC

## 2013-01-23 NOTE — Telephone Encounter (Signed)
New Problem  Spironolactone 50 mg to [facility] Furosemide 20 mg to [facility]

## 2013-01-23 NOTE — Telephone Encounter (Signed)
Follow up call    Refills on all medication    [facility] [address]

## 2013-01-23 NOTE — Telephone Encounter (Signed)
Refill completed for 1 year, last ov recall is for 1 year.

## 2013-01-24 ENCOUNTER — Other Ambulatory Visit (HOSPITAL_BASED_OUTPATIENT_CLINIC_OR_DEPARTMENT_OTHER): Payer: Medicaid Other

## 2013-02-05 ENCOUNTER — Other Ambulatory Visit (HOSPITAL_BASED_OUTPATIENT_CLINIC_OR_DEPARTMENT_OTHER): Payer: Medicaid Other

## 2013-02-06 ENCOUNTER — Other Ambulatory Visit (HOSPITAL_BASED_OUTPATIENT_CLINIC_OR_DEPARTMENT_OTHER): Payer: Medicaid Other

## 2013-02-07 ENCOUNTER — Telehealth (HOSPITAL_BASED_OUTPATIENT_CLINIC_OR_DEPARTMENT_OTHER): Payer: Self-pay | Admitting: Radiology

## 2013-02-07 ENCOUNTER — Ambulatory Visit (HOSPITAL_BASED_OUTPATIENT_CLINIC_OR_DEPARTMENT_OTHER): Payer: Medicaid Other | Attending: Pulmonary Disease | Admitting: Radiology

## 2013-02-07 ENCOUNTER — Telehealth: Payer: Self-pay | Admitting: Pulmonary Disease

## 2013-02-07 DIAGNOSIS — G4733 Obstructive sleep apnea (adult) (pediatric): Secondary | ICD-10-CM

## 2013-02-07 DIAGNOSIS — Z9989 Dependence on other enabling machines and devices: Secondary | ICD-10-CM

## 2013-02-07 NOTE — Telephone Encounter (Signed)
I spoke with pt. He stated he has been trying to get in touch with [facility] and is not able to. Referral was faxed over to them 01/15/13. PCC's do you guys have alternate # ? Please advise thanks

## 2013-02-08 NOTE — Telephone Encounter (Signed)
Order was sent to [facility] not [facility]. [facility] has closed. Called patient and he stated that he has not heard from [facility]. Called [facility] at [phone] and left message for Misty Stanley to return my call as to the status of this order. Rhonda J Cobb

## 2013-02-12 NOTE — Telephone Encounter (Signed)
Spoke with Misty Stanley at [facility]. She stated that she left message for patient on 01/24/13 that order had been received to contact her at [phone] to arrange. Misty Stanley stated that she didn't hear anything from patient and on 01/30/13, she spoke with a woman and left message for pt to call her again.  I called and spoke with patient this morning and advised of the above and apparently patient had not been given any of the messages. Asked patient to contact Misty Stanley at [phone]. Advised patient that she may be with a patient, because she is the only one in the office, if he received her voice mail to leave message and Misty Stanley will contact him to schedule appointment. Rhonda J Cobb

## 2013-02-13 ENCOUNTER — Telehealth: Payer: Self-pay | Admitting: Pulmonary Disease

## 2013-02-13 NOTE — Telephone Encounter (Signed)
Called and spoke with patient. Advised patient that since [facility] is in [place] and he has issues with transportation, I have sent order to [facility] in [place] to check cpap and make sure in working order with heated humidifier. Show pt how to operate and set machine on 14 cm. Advised patient that [facility] should be able to check machine and if it is not working correctly since this cpap was purchased in 2006, he may be eligible for a new cpap. Pt is aware that [facility] will be contacting him to arrange. Nothing further needed at this time. Rhonda J Cobb

## 2013-02-19 ENCOUNTER — Telehealth: Payer: Self-pay | Admitting: Pulmonary Disease

## 2013-02-19 NOTE — Telephone Encounter (Signed)
Spoke with Spencer Robertson with [facility].  States they spoke with pt.  He is scheduled for tomorrow around lunchtime.  This is just FYI.

## 2013-02-19 NOTE — Telephone Encounter (Signed)
Spoke with [facility] and they will have someone contact pt today regarding cpap order.

## 2013-03-12 ENCOUNTER — Ambulatory Visit: Payer: Medicaid Other | Admitting: Pulmonary Disease

## 2013-03-15 ENCOUNTER — Ambulatory Visit (INDEPENDENT_AMBULATORY_CARE_PROVIDER_SITE_OTHER): Payer: Medicaid Other | Admitting: *Deleted

## 2013-03-15 DIAGNOSIS — I428 Other cardiomyopathies: Secondary | ICD-10-CM

## 2013-03-15 DIAGNOSIS — Z95810 Presence of automatic (implantable) cardiac defibrillator: Secondary | ICD-10-CM

## 2013-03-19 ENCOUNTER — Ambulatory Visit: Payer: Medicaid Other | Admitting: Pulmonary Disease

## 2013-03-26 ENCOUNTER — Other Ambulatory Visit: Payer: Self-pay | Admitting: *Deleted

## 2013-03-26 DIAGNOSIS — I420 Dilated cardiomyopathy: Secondary | ICD-10-CM

## 2013-03-26 DIAGNOSIS — I5022 Chronic systolic (congestive) heart failure: Secondary | ICD-10-CM

## 2013-03-26 MED ORDER — CARVEDILOL 25 MG PO TABS: 25.0000 mg | ORAL_TABLET | Freq: Two times a day (BID) | ORAL | Status: DC

## 2013-04-05 ENCOUNTER — Ambulatory Visit: Payer: Medicaid Other | Admitting: Pulmonary Disease

## 2013-04-14 ENCOUNTER — Encounter: Payer: Self-pay | Admitting: Internal Medicine

## 2013-04-26 ENCOUNTER — Ambulatory Visit (INDEPENDENT_AMBULATORY_CARE_PROVIDER_SITE_OTHER): Payer: Medicaid Other | Admitting: *Deleted

## 2013-04-26 DIAGNOSIS — I428 Other cardiomyopathies: Secondary | ICD-10-CM

## 2013-04-26 DIAGNOSIS — Z95810 Presence of automatic (implantable) cardiac defibrillator: Secondary | ICD-10-CM

## 2013-04-30 LAB — REMOTE ICD DEVICE
AL IMPEDENCE ICD: 564 Ohm
DEVICE MODEL ICD: 480425
HV IMPEDENCE: 64 Ohm
RV LEAD AMPLITUDE: 12.4 mv
RV LEAD IMPEDENCE ICD: 482 Ohm
TZAT-0001FASTVT: 1
TZAT-0002FASTVT: NEGATIVE
TZAT-0013FASTVT: 1
TZAT-0013SLOWVT: 2
TZAT-0018FASTVT: NEGATIVE
TZAT-0018SLOWVT: NEGATIVE
TZON-0003FASTVT: 285.7 ms
TZON-0003SLOWVT: 315.8 ms
TZST-0001FASTVT: 3
TZST-0001FASTVT: 5
TZST-0001FASTVT: 6
TZST-0001SLOWVT: 3
TZST-0001SLOWVT: 6
TZST-0001SLOWVT: 7
TZST-0003FASTVT: 41 J
TZST-0003FASTVT: 41 J
TZST-0003FASTVT: 41 J
TZST-0003SLOWVT: 41 J
TZST-0003SLOWVT: 41 J
TZST-0003SLOWVT: 41 J
VENTRICULAR PACING ICD: 99 pct
VF: 0

## 2013-05-09 ENCOUNTER — Ambulatory Visit: Payer: Medicaid Other | Admitting: Pulmonary Disease

## 2013-05-28 ENCOUNTER — Ambulatory Visit: Payer: Medicaid Other | Admitting: Pulmonary Disease

## 2013-06-04 ENCOUNTER — Telehealth: Payer: Self-pay | Admitting: Internal Medicine

## 2013-06-18 ENCOUNTER — Ambulatory Visit: Payer: Medicaid Other | Admitting: Pulmonary Disease

## 2013-06-21 ENCOUNTER — Encounter: Payer: Self-pay | Admitting: Pulmonary Disease

## 2013-07-19 ENCOUNTER — Encounter: Payer: Medicaid Other | Admitting: *Deleted

## 2013-07-23 ENCOUNTER — Other Ambulatory Visit: Payer: Self-pay | Admitting: *Deleted

## 2013-07-23 MED ORDER — FOSINOPRIL SODIUM 20 MG PO TABS: 20.0000 mg | ORAL_TABLET | Freq: Every day | ORAL | Status: DC

## 2013-07-23 MED ORDER — SOTALOL HCL 80 MG PO TABS: 80.0000 mg | ORAL_TABLET | Freq: Two times a day (BID) | ORAL | Status: DC

## 2013-07-23 NOTE — Telephone Encounter (Signed)
Fax Received. Refill Completed. Latissa Frick Chowoe (R.M.A)   

## 2013-12-12 ENCOUNTER — Encounter: Payer: Medicaid Other | Admitting: Internal Medicine

## 2014-01-01 ENCOUNTER — Telehealth: Payer: Self-pay | Admitting: *Deleted

## 2014-01-01 NOTE — Telephone Encounter (Signed)
intiated PA to [facility] for monopril

## 2014-01-15 ENCOUNTER — Encounter: Payer: Medicaid Other | Admitting: Internal Medicine

## 2014-01-18 ENCOUNTER — Encounter: Payer: Self-pay | Admitting: Internal Medicine

## 2014-01-18 ENCOUNTER — Ambulatory Visit (INDEPENDENT_AMBULATORY_CARE_PROVIDER_SITE_OTHER): Payer: Medicaid Other | Admitting: Internal Medicine

## 2014-01-18 VITALS — BP 129/90 | HR 76 | Ht 70.0 in | Wt 290.0 lb

## 2014-01-18 DIAGNOSIS — Z79899 Other long term (current) drug therapy: Secondary | ICD-10-CM

## 2014-01-18 DIAGNOSIS — R42 Dizziness and giddiness: Secondary | ICD-10-CM

## 2014-01-18 DIAGNOSIS — I5022 Chronic systolic (congestive) heart failure: Secondary | ICD-10-CM

## 2014-01-18 DIAGNOSIS — Z95810 Presence of automatic (implantable) cardiac defibrillator: Secondary | ICD-10-CM

## 2014-01-18 DIAGNOSIS — I428 Other cardiomyopathies: Secondary | ICD-10-CM

## 2014-01-18 LAB — BASIC METABOLIC PANEL
BUN: 10 mg/dL (ref 6–23)
CALCIUM: 9 mg/dL (ref 8.4–10.5)
CHLORIDE: 104 meq/L (ref 96–112)
CO2: 30 mEq/L (ref 19–32)
CREATININE: 1 mg/dL (ref 0.4–1.5)
GFR: 100.98 mL/min (ref >60.00)
Glucose, Bld: 98 mg/dL (ref 70–99)
Potassium: 4 mEq/L (ref 3.5–5.1)
Sodium: 139 mEq/L (ref 135–145)

## 2014-01-18 LAB — MAGNESIUM: MAGNESIUM: 1.7 mg/dL (ref 1.5–2.5)

## 2014-01-18 NOTE — Assessment & Plan Note (Signed)
On guideline directed medical therapy; African American may be a role for hydralazine nitrates. We will check his metabolic profile and magnesium today as he is  on sotalol and Aldactone

## 2014-01-18 NOTE — Assessment & Plan Note (Signed)
He is euvolemic today. I instructed him however, noticed increasing edema to take extra Lasix. We'll also contact the heart clinic to see if we can arrange for him to have a scale at home.

## 2014-01-18 NOTE — Assessment & Plan Note (Signed)
The patient's device was interrogated.  The information was reviewed. No changes were made in the programming.    

## 2014-01-18 NOTE — Assessment & Plan Note (Signed)
His symptoms of orthostatic lightheadedness although subjective measurements today are negative. Interestingly he had some evidence of autonomic dysfunction with orthostatic hypertension

## 2014-01-18 NOTE — Progress Notes (Signed)
      Patient Care Team: Burtis Junes, MD as PCP - General (General Surgery)   HPI  Spencer Robertson is a 53 y.o. male is seen today in followup for nonischemic cardiomyopathy with congestive heart failure and left bundle branch block, and status post CRT-D implantation originally in November 2005 with change out September 2011. The procedure was complicated by high DFT and he was started on sotalol with repeat testing demonstrating adequate defibrillation margins.  The patient denies , chest pain,   or palpitations;   He does have periodic episodes of shortness of breath which are accompanied by episodes of peripheral edema. He has not sure that there is an association with changes in his salt intake.  He has in the past uses furosemide in a predictable daily basis. He does not have a scale at home.   He is tolerating his medications well. He has gotten his Medicaid card   He also complains of orthostatic lightheadedness.  Past Medical History  Diagnosis Date  . Hypertension   . Nonischemic cardiomyopathy   . Mitral regurgitation   . Hyperlipidemia   . Tobacco abuse   . S/P implantation of automatic cardioverter/defibrillator (AICD)     Guidant Contact H177  . OSA (obstructive sleep apnea)     severe  . Obesity     Past Surgical History  Procedure Laterality Date  . Cardiac defibrillator placement      Guidant Contak H177 device     Current Outpatient Prescriptions  Medication Sig Dispense Refill  . aspirin 81 MG tablet Take 81 mg by mouth daily.        . carvedilol (COREG) 25 MG tablet Take 1 tablet (25 mg total) by mouth 2 (two) times daily with a meal.  60 tablet  6  . fosinopril (MONOPRIL) 20 MG tablet Take 1 tablet (20 mg total) by mouth daily.  30 tablet  6  . furosemide (LASIX) 20 MG tablet Take 1 tablet (20 mg total) by mouth daily.  30 tablet  11  . mometasone (NASONEX) 50 MCG/ACT nasal spray 2 sprays each nostril each AM  17 g  3  . sotalol (BETAPACE)  80 MG tablet Take 1 tablet (80 mg total) by mouth 2 (two) times daily.  60 tablet  4  . spironolactone (ALDACTONE) 50 MG tablet Take 1/2 tablet by mouth once daily  15 tablet  11   No current facility-administered medications for this visit.    No Known Allergies  Review of Systems negative except from HPI and PMH  Physical Exam BP 121/87  Pulse 70  Ht 5\' 10"  (1.778 m)  Wt 290 lb (131.543 kg)  BMI 41.61 kg/m2 Well developed and well nourished in no acute distress HENT normal E scleral and icterus clear Neck Supple JVP flat; carotids brisk and full Clear to ausculation  Regular rate and rhythm, no murmurs gallops or rub Soft with active bowel sounds No clubbing cyanosis none Edema Alert and oriented, grossly normal motor and sensory function Skin Warm and Dry  ECG demonstrates P. synchronous pacing with a stable QRS configuration and the duration of 140 ms compared to post implant tracing.  Assessment and  Plan

## 2014-01-18 NOTE — Patient Instructions (Signed)
You will need lab work today:  BMP, MG We will call you with the results  Your physician recommends that you schedule a follow-up appointment in: 3 months with Nehemiah Settle  Your physician recommends that you schedule a follow-up appointment in: 6 months with Dr. Graciela Husbands  If you have increased swelling/edema  You may increase your lasix (furosemide) to 40mg  a day for 3 days. Then return to your normal dose of 20mg  daily.  Please call our office and let us know if the swelling occurs and does/not go down after the increased furosemide

## 2014-02-05 ENCOUNTER — Other Ambulatory Visit: Payer: Self-pay | Admitting: *Deleted

## 2014-02-05 DIAGNOSIS — R790 Abnormal level of blood mineral: Secondary | ICD-10-CM

## 2014-02-05 MED ORDER — MAGNESIUM OXIDE 400 MG PO TABS: 400.0000 mg | ORAL_TABLET | Freq: Every day | ORAL | Status: DC

## 2014-02-06 ENCOUNTER — Other Ambulatory Visit: Payer: Self-pay

## 2014-02-06 DIAGNOSIS — I5022 Chronic systolic (congestive) heart failure: Secondary | ICD-10-CM

## 2014-02-06 DIAGNOSIS — I420 Dilated cardiomyopathy: Secondary | ICD-10-CM

## 2014-02-06 MED ORDER — FUROSEMIDE 20 MG PO TABS: 20.0000 mg | ORAL_TABLET | Freq: Every day | ORAL | Status: DC

## 2014-02-06 MED ORDER — SPIRONOLACTONE 50 MG PO TABS: ORAL_TABLET | ORAL | Status: DC

## 2014-02-18 ENCOUNTER — Other Ambulatory Visit: Payer: Medicaid Other

## 2014-02-22 ENCOUNTER — Other Ambulatory Visit: Payer: Medicaid Other

## 2014-02-26 ENCOUNTER — Other Ambulatory Visit (INDEPENDENT_AMBULATORY_CARE_PROVIDER_SITE_OTHER): Payer: Medicaid Other

## 2014-02-26 ENCOUNTER — Encounter: Payer: Self-pay | Admitting: Cardiology

## 2014-02-26 DIAGNOSIS — R790 Abnormal level of blood mineral: Secondary | ICD-10-CM

## 2014-02-26 LAB — MAGNESIUM: Magnesium: 1.7 mg/dL (ref 1.5–2.5)

## 2014-02-27 ENCOUNTER — Other Ambulatory Visit: Payer: Self-pay

## 2014-02-27 DIAGNOSIS — R790 Abnormal level of blood mineral: Secondary | ICD-10-CM

## 2014-02-27 MED ORDER — MAGNESIUM OXIDE 400 MG PO CAPS: ORAL_CAPSULE | ORAL | Status: DC

## 2014-03-06 ENCOUNTER — Other Ambulatory Visit: Payer: Self-pay

## 2014-03-06 DIAGNOSIS — I5022 Chronic systolic (congestive) heart failure: Secondary | ICD-10-CM

## 2014-03-06 DIAGNOSIS — I420 Dilated cardiomyopathy: Secondary | ICD-10-CM

## 2014-03-06 MED ORDER — CARVEDILOL 25 MG PO TABS: 25.0000 mg | ORAL_TABLET | Freq: Two times a day (BID) | ORAL | Status: DC

## 2014-03-06 MED ORDER — SOTALOL HCL 80 MG PO TABS: 80.0000 mg | ORAL_TABLET | Freq: Two times a day (BID) | ORAL | Status: DC

## 2014-03-20 ENCOUNTER — Other Ambulatory Visit: Payer: Medicaid Other

## 2014-03-29 ENCOUNTER — Other Ambulatory Visit: Payer: Medicaid Other

## 2014-04-01 ENCOUNTER — Other Ambulatory Visit (INDEPENDENT_AMBULATORY_CARE_PROVIDER_SITE_OTHER): Payer: Medicaid Other

## 2014-04-01 DIAGNOSIS — R790 Abnormal level of blood mineral: Secondary | ICD-10-CM

## 2014-04-01 LAB — MAGNESIUM: MAGNESIUM: 1.5 mg/dL (ref 1.5–2.5)

## 2014-04-22 ENCOUNTER — Encounter: Payer: Self-pay | Admitting: Internal Medicine

## 2014-04-22 NOTE — Telephone Encounter (Signed)
This encounter was created in error - please disregard.

## 2014-04-22 NOTE — Telephone Encounter (Signed)
Follow Up ° °Patient returned the call  °

## 2014-04-22 NOTE — Telephone Encounter (Signed)
New message    patient stated he on someone else phone will call the nurse back.

## 2014-04-23 ENCOUNTER — Encounter: Payer: Medicaid Other | Admitting: Cardiology

## 2014-04-23 ENCOUNTER — Other Ambulatory Visit: Payer: Self-pay | Admitting: *Deleted

## 2014-04-23 ENCOUNTER — Telehealth: Payer: Self-pay | Admitting: Internal Medicine

## 2014-04-23 DIAGNOSIS — R790 Abnormal level of blood mineral: Secondary | ICD-10-CM

## 2014-04-23 MED ORDER — MAGNESIUM OXIDE 400 MG PO CAPS: ORAL_CAPSULE | ORAL | Status: DC

## 2014-04-23 NOTE — Telephone Encounter (Signed)
Follow up     Returning call from nurse on test results.

## 2014-04-23 NOTE — Telephone Encounter (Signed)
Spoke to patient. He said he was already talking with a nurse from Dr. Odessa Fleming office on another line. I deferred and hang up so he could complete his call with the other nurse.

## 2014-05-24 ENCOUNTER — Other Ambulatory Visit: Payer: Medicaid Other

## 2014-06-20 ENCOUNTER — Other Ambulatory Visit: Payer: Self-pay

## 2014-06-20 MED ORDER — FOSINOPRIL SODIUM 20 MG PO TABS: 20.0000 mg | ORAL_TABLET | Freq: Every day | ORAL | Status: DC

## 2014-07-05 ENCOUNTER — Other Ambulatory Visit: Payer: Medicaid Other

## 2014-07-25 ENCOUNTER — Other Ambulatory Visit (INDEPENDENT_AMBULATORY_CARE_PROVIDER_SITE_OTHER): Payer: Medicaid Other

## 2014-07-25 ENCOUNTER — Ambulatory Visit (INDEPENDENT_AMBULATORY_CARE_PROVIDER_SITE_OTHER): Payer: Medicaid Other | Admitting: *Deleted

## 2014-07-25 ENCOUNTER — Encounter: Payer: Medicaid Other | Admitting: Internal Medicine

## 2014-07-25 DIAGNOSIS — I428 Other cardiomyopathies: Secondary | ICD-10-CM

## 2014-07-25 DIAGNOSIS — R7989 Other specified abnormal findings of blood chemistry: Secondary | ICD-10-CM

## 2014-07-25 LAB — MDC_IDC_ENUM_SESS_TYPE_INCLINIC
Brady Statistic RA Percent Paced: 18 %
HighPow Impedance: 44 Ohm
HighPow Impedance: 62 Ohm
Lead Channel Impedance Value: 540 Ohm
Lead Channel Impedance Value: 620 Ohm
Lead Channel Pacing Threshold Pulse Width: 0.4 ms
Lead Channel Pacing Threshold Pulse Width: 0.4 ms
Lead Channel Pacing Threshold Pulse Width: 0.4 ms
Lead Channel Sensing Intrinsic Amplitude: 11.9 mV
Lead Channel Setting Pacing Amplitude: 2 V
Lead Channel Setting Pacing Pulse Width: 0.4 ms
Lead Channel Setting Sensing Sensitivity: 0.5 mV
MDC IDC MSMT LEADCHNL LV PACING THRESHOLD AMPLITUDE: 0.8 V
MDC IDC MSMT LEADCHNL LV SENSING INTR AMPL: 16.3 mV
MDC IDC MSMT LEADCHNL RA PACING THRESHOLD AMPLITUDE: 1 V
MDC IDC MSMT LEADCHNL RA SENSING INTR AMPL: 5.2 mV
MDC IDC MSMT LEADCHNL RV IMPEDANCE VALUE: 484 Ohm
MDC IDC MSMT LEADCHNL RV PACING THRESHOLD AMPLITUDE: 1.1 V
MDC IDC PG SERIAL: 480425
MDC IDC SESS DTM: 20150820040000
MDC IDC SET LEADCHNL LV PACING PULSEWIDTH: 0.4 ms
MDC IDC SET LEADCHNL LV SENSING SENSITIVITY: 1 mV
MDC IDC SET LEADCHNL RA PACING AMPLITUDE: 2 V
MDC IDC SET LEADCHNL RV PACING AMPLITUDE: 2.4 V
MDC IDC SET ZONE DETECTION INTERVAL: 250 ms
MDC IDC SET ZONE DETECTION INTERVAL: 286 ms
MDC IDC STAT BRADY RV PERCENT PACED: 99 %
Zone Setting Detection Interval: 316 ms

## 2014-07-25 LAB — MAGNESIUM: Magnesium: 1.7 mg/dL (ref 1.5–2.5)

## 2014-07-25 NOTE — Progress Notes (Signed)
ICD check in clinic. Normal device function. Thresholds and sensing consistent with previous device measurements. Impedance trends stable over time. No evidence of any ventricular arrhythmias. No mode switches. Histogram distribution appropriate for patient and level of activity. No changes made this session. Device programmed at appropriate safety margins. Device programmed to optimize intrinsic conduction. Estimated longevity 7 years. ROV 11-06-14 @ 915 with SK.

## 2014-09-19 ENCOUNTER — Encounter: Payer: Medicaid Other | Admitting: Internal Medicine

## 2014-09-25 ENCOUNTER — Other Ambulatory Visit: Payer: Self-pay

## 2014-09-25 DIAGNOSIS — I5022 Chronic systolic (congestive) heart failure: Secondary | ICD-10-CM

## 2014-09-25 DIAGNOSIS — I420 Dilated cardiomyopathy: Secondary | ICD-10-CM

## 2014-09-25 MED ORDER — FUROSEMIDE 20 MG PO TABS: 20.0000 mg | ORAL_TABLET | Freq: Every day | ORAL | Status: DC

## 2014-10-10 ENCOUNTER — Encounter: Payer: Self-pay | Admitting: Internal Medicine

## 2014-11-06 ENCOUNTER — Ambulatory Visit (INDEPENDENT_AMBULATORY_CARE_PROVIDER_SITE_OTHER): Payer: Medicaid Other | Admitting: Internal Medicine

## 2014-11-06 ENCOUNTER — Encounter: Payer: Self-pay | Admitting: Internal Medicine

## 2014-11-06 VITALS — BP 126/82 | HR 79 | Ht 70.0 in | Wt 282.0 lb

## 2014-11-06 DIAGNOSIS — G4730 Sleep apnea, unspecified: Secondary | ICD-10-CM

## 2014-11-06 DIAGNOSIS — I428 Other cardiomyopathies: Secondary | ICD-10-CM

## 2014-11-06 DIAGNOSIS — I5022 Chronic systolic (congestive) heart failure: Secondary | ICD-10-CM

## 2014-11-06 DIAGNOSIS — Z4502 Encounter for adjustment and management of automatic implantable cardiac defibrillator: Secondary | ICD-10-CM

## 2014-11-06 DIAGNOSIS — I420 Dilated cardiomyopathy: Secondary | ICD-10-CM

## 2014-11-06 DIAGNOSIS — I429 Cardiomyopathy, unspecified: Secondary | ICD-10-CM

## 2014-11-06 LAB — MDC_IDC_ENUM_SESS_TYPE_INCLINIC
Brady Statistic RA Percent Paced: 11 %
Brady Statistic RV Percent Paced: 98 %
HIGH POWER IMPEDANCE MEASURED VALUE: 64 Ohm
HighPow Impedance: 44 Ohm
Implantable Pulse Generator Serial Number: 480425
Lead Channel Impedance Value: 483 Ohm
Lead Channel Impedance Value: 551 Ohm
Lead Channel Pacing Threshold Amplitude: 0.7 V
Lead Channel Pacing Threshold Amplitude: 0.9 V
Lead Channel Pacing Threshold Amplitude: 1 V
Lead Channel Pacing Threshold Pulse Width: 0.4 ms
Lead Channel Sensing Intrinsic Amplitude: 11 mV
Lead Channel Sensing Intrinsic Amplitude: 5.2 mV
Lead Channel Setting Pacing Pulse Width: 0.4 ms
Lead Channel Setting Pacing Pulse Width: 0.4 ms
Lead Channel Setting Sensing Sensitivity: 0.5 mV
Lead Channel Setting Sensing Sensitivity: 1 mV
MDC IDC MSMT LEADCHNL LV IMPEDANCE VALUE: 635 Ohm
MDC IDC MSMT LEADCHNL LV PACING THRESHOLD PULSEWIDTH: 0.4 ms
MDC IDC MSMT LEADCHNL LV SENSING INTR AMPL: 20.6 mV
MDC IDC MSMT LEADCHNL RV PACING THRESHOLD PULSEWIDTH: 0.4 ms
MDC IDC SESS DTM: 20151202050000
MDC IDC SET LEADCHNL LV PACING AMPLITUDE: 2 V
MDC IDC SET LEADCHNL RA PACING AMPLITUDE: 2 V
MDC IDC SET LEADCHNL RV PACING AMPLITUDE: 2.4 V
MDC IDC SET ZONE DETECTION INTERVAL: 316 ms
Zone Setting Detection Interval: 250 ms
Zone Setting Detection Interval: 286 ms

## 2014-11-06 MED ORDER — SOTALOL HCL 80 MG PO TABS: 80.0000 mg | ORAL_TABLET | Freq: Two times a day (BID) | ORAL | Status: DC

## 2014-11-06 MED ORDER — SPIRONOLACTONE 50 MG PO TABS: ORAL_TABLET | ORAL | Status: DC

## 2014-11-06 MED ORDER — CARVEDILOL 25 MG PO TABS: 25.0000 mg | ORAL_TABLET | Freq: Two times a day (BID) | ORAL | Status: DC

## 2014-11-06 NOTE — Progress Notes (Signed)
Patient Care Team: Burtis Junes, MD as PCP - General (General Surgery)   HPI  Spencer Robertson is a 53 y.o. male is seen today in followup for nonischemic cardiomyopathy with congestive heart failure and left bundle branch block, and status post CRT-D implantation originally in November 2005 with change out September 2011. The procedure was complicated by high DFT and he was started on sotalol with repeat testing demonstrating adequate defibrillation margins.  The patient denies , chest pain,   or palpitations;   He does have periodic episodes of shortness of breath which are accompanied by episodes of peripheral edema. He has not sure that there is an association with changes in his salt intake  He is tolerating his medications well. He has gotten his Medicaid card  he has run out of some of his medications. He said the office was called but didn't respond in a timely fashion.   He is mostly sedentary.  He has sleep apnea but his machine is no longer working or is outdated.  No further orthostatic lightheadedness.   Past Medical History  Diagnosis Date  . Hypertension   . Nonischemic cardiomyopathy   . Mitral regurgitation   . Hyperlipidemia   . Tobacco abuse   . S/P implantation of automatic cardioverter/defibrillator (AICD)     Guidant Contact H177  . OSA (obstructive sleep apnea)     severe  . Obesity     Past Surgical History  Procedure Laterality Date  . Cardiac defibrillator placement      Guidant Contak H177 device     Current Outpatient Prescriptions  Medication Sig Dispense Refill  . aspirin 81 MG tablet Take 81 mg by mouth daily.      . carvedilol (COREG) 25 MG tablet Take 1 tablet (25 mg total) by mouth 2 (two) times daily with a meal. 60 tablet 6  . fosinopril (MONOPRIL) 20 MG tablet Take 1 tablet (20 mg total) by mouth daily. 30 tablet 3  . furosemide (LASIX) 20 MG tablet Take 1 tablet (20 mg total) by mouth daily. 30 tablet 6  . Magnesium  Oxide 400 MG CAPS Take 400 mg daily 30 capsule 6  . mometasone (NASONEX) 50 MCG/ACT nasal spray 2 sprays each nostril each AM 17 g 3  . sotalol (BETAPACE) 80 MG tablet Take 1 tablet (80 mg total) by mouth 2 (two) times daily. 60 tablet 4  . spironolactone (ALDACTONE) 50 MG tablet Take 1/2 tablet by mouth once daily 15 tablet 6   No current facility-administered medications for this visit.    No Known Allergies  Review of Systems negative except from HPI and PMH  Physical Exam BP 126/82 mmHg  Pulse 79  Ht 5\' 10"  (1.778 m)  Wt 282 lb (127.914 kg)  BMI 40.46 kg/m2 Well developed and well nourished in no acute distress HENT normal E scleral and icterus clear Neck Supple JVP flat; carotids brisk and full Clear to ausculation  Regular rate and rhythm, no murmurs gallops or rub Soft with active bowel sounds No clubbing cyanosis none Edema Alert and oriented, grossly normal motor and sensory function Skin Warm and Dry  ECG demonstrates P. synchronous pacing with a stable QRS configuration and the duration of 140 ms compared to post implant tracing.  Assessment and  Plan  NICM  CHF chronic systolic  Hypertension  Morbidobesity  Sleep apnea  Atrial tachycardia  Cigarette abuse  Nonsustained ventricular tachycardia  Implantable defibrillator-CRT-[employer]  He  is euvolemic. He has run out of his medications and we will resume them. It would be appropriate to consider, if blood pressure sufficient, to add hydralazine/nitrates. We will consider that his next visit.  Referring back to Dr. Shelle Ironlance for management of sleep apnea.  There is no intercurrent sustained ventricular arrhythmias although he has had nonsustained ventricular tachycardia. He has also had atrial tachycardia identified on his devic. Ventricular tachycardia-nonsustained has been noted. No therapies delivered  He continues to smoke.

## 2014-11-06 NOTE — Patient Instructions (Signed)
Your physician recommends that you continue on your current medications as directed. Please refer to the Current Medication list given to you today.  Labs today: BMET, Magnesium, A1C  You have been referred to Dr. Shelle Iron with pulmonology  Your physician wants you to follow-up in: 6 months with Dr. Graciela Husbands. You will receive a reminder letter in the mail two months in advance. If you don't receive a letter, please call our office to schedule the follow-up appointment.

## 2014-11-18 ENCOUNTER — Ambulatory Visit: Payer: Medicaid Other | Admitting: Pulmonary Disease

## 2014-12-11 ENCOUNTER — Other Ambulatory Visit: Payer: Self-pay

## 2014-12-11 MED ORDER — FOSINOPRIL SODIUM 20 MG PO TABS: 20.0000 mg | ORAL_TABLET | Freq: Every day | ORAL | Status: DC

## 2015-01-07 ENCOUNTER — Telehealth: Payer: Self-pay | Admitting: *Deleted

## 2015-01-07 NOTE — Telephone Encounter (Signed)
No VM or answer.   Re: pt still has not picked up his Latitude.

## 2015-01-09 NOTE — Telephone Encounter (Signed)
Attempt #2.  NO VM or answer.

## 2015-01-15 NOTE — Telephone Encounter (Signed)
3rd attempt. I have attempted various times of the work day to reach the patient. Home & mobile # listed the same.   No VM or answer.

## 2015-01-21 ENCOUNTER — Telehealth: Payer: Self-pay | Admitting: *Deleted

## 2015-01-21 NOTE — Telephone Encounter (Signed)
Called [employer] [employer] @ [phone]. Initiated Prior Authorization for Fosinopril Sodium 20 mg by mouth daily.  Confirmation # [id number] P to call back for decision on 01/22/15 after noon.

## 2015-01-28 NOTE — Telephone Encounter (Signed)
Confirmed PA for Fosinopril accepted.  PA # [id number].  Approved through 01/16/16.

## 2015-02-05 ENCOUNTER — Telehealth: Payer: Self-pay | Admitting: Cardiology

## 2015-02-05 ENCOUNTER — Encounter: Payer: Medicaid Other | Admitting: *Deleted

## 2015-02-05 NOTE — Telephone Encounter (Signed)
Attempted to confirm remote transmission with pt. No answer and was unable to leave a message.   

## 2015-02-06 ENCOUNTER — Encounter: Payer: Self-pay | Admitting: Cardiology

## 2015-02-10 NOTE — Telephone Encounter (Signed)
4th attempt. No VM or answer.  Also noted in [employer].

## 2015-04-09 ENCOUNTER — Encounter: Payer: Self-pay | Admitting: Internal Medicine

## 2015-04-09 ENCOUNTER — Ambulatory Visit (INDEPENDENT_AMBULATORY_CARE_PROVIDER_SITE_OTHER): Payer: Medicaid Other | Admitting: *Deleted

## 2015-04-09 DIAGNOSIS — I5022 Chronic systolic (congestive) heart failure: Secondary | ICD-10-CM

## 2015-04-09 DIAGNOSIS — I429 Cardiomyopathy, unspecified: Secondary | ICD-10-CM | POA: Diagnosis not present

## 2015-04-09 DIAGNOSIS — I428 Other cardiomyopathies: Secondary | ICD-10-CM

## 2015-04-09 LAB — CUP PACEART INCLINIC DEVICE CHECK
Battery Remaining Longevity: 6
Brady Statistic RA Percent Paced: 16 %
HIGH POWER IMPEDANCE MEASURED VALUE: 44 Ohm
HighPow Impedance: 66 Ohm
Lead Channel Impedance Value: 505 Ohm
Lead Channel Impedance Value: 581 Ohm
Lead Channel Impedance Value: 644 Ohm
Lead Channel Pacing Threshold Amplitude: 0.7 V
Lead Channel Pacing Threshold Amplitude: 1 V
Lead Channel Pacing Threshold Pulse Width: 0.4 ms
Lead Channel Pacing Threshold Pulse Width: 0.4 ms
Lead Channel Sensing Intrinsic Amplitude: 20 mV
Lead Channel Sensing Intrinsic Amplitude: 4.6 mV
Lead Channel Setting Pacing Amplitude: 2.4 V
Lead Channel Setting Sensing Sensitivity: 0.5 mV
Lead Channel Setting Sensing Sensitivity: 1 mV
MDC IDC MSMT LEADCHNL RV PACING THRESHOLD AMPLITUDE: 1 V
MDC IDC MSMT LEADCHNL RV PACING THRESHOLD PULSEWIDTH: 0.4 ms
MDC IDC MSMT LEADCHNL RV SENSING INTR AMPL: 12.1 mV
MDC IDC PG SERIAL: 480425
MDC IDC SESS DTM: 20160504040000
MDC IDC SET LEADCHNL LV PACING AMPLITUDE: 2 V
MDC IDC SET LEADCHNL LV PACING PULSEWIDTH: 0.4 ms
MDC IDC SET LEADCHNL RA PACING AMPLITUDE: 2 V
MDC IDC SET LEADCHNL RV PACING PULSEWIDTH: 0.4 ms
MDC IDC SET ZONE DETECTION INTERVAL: 316 ms
MDC IDC STAT BRADY RV PERCENT PACED: 99 %
Zone Setting Detection Interval: 250 ms
Zone Setting Detection Interval: 286 ms

## 2015-04-09 NOTE — Progress Notes (Signed)
CRT-D device check in office. Thresholds and sensing consistent with previous device measurements. Lead impedance trends stable over time. No mode switch episodes recorded. No ventricular arrhythmia episodes recorded. Patient bi-ventricularly pacing 99% of the time. Device programmed with appropriate safety margins. Heart failure diagnostics reviewed and footprint appears stable. No changes made this session. Estimated longevity 6 years.  Patient will follow up with SK on 6/29 @ 0900.

## 2015-05-16 ENCOUNTER — Other Ambulatory Visit: Payer: Self-pay | Admitting: *Deleted

## 2015-05-16 DIAGNOSIS — I5022 Chronic systolic (congestive) heart failure: Secondary | ICD-10-CM

## 2015-05-16 DIAGNOSIS — I420 Dilated cardiomyopathy: Secondary | ICD-10-CM

## 2015-05-16 MED ORDER — FOSINOPRIL SODIUM 20 MG PO TABS: 20.0000 mg | ORAL_TABLET | Freq: Every day | ORAL | Status: DC

## 2015-05-16 MED ORDER — FUROSEMIDE 20 MG PO TABS: 20.0000 mg | ORAL_TABLET | Freq: Every day | ORAL | Status: DC

## 2015-06-04 ENCOUNTER — Encounter: Payer: Medicaid Other | Admitting: Internal Medicine

## 2015-06-16 ENCOUNTER — Other Ambulatory Visit: Payer: Self-pay

## 2015-06-16 ENCOUNTER — Other Ambulatory Visit: Payer: Self-pay | Admitting: *Deleted

## 2015-06-16 DIAGNOSIS — I5022 Chronic systolic (congestive) heart failure: Secondary | ICD-10-CM

## 2015-06-16 DIAGNOSIS — I420 Dilated cardiomyopathy: Secondary | ICD-10-CM

## 2015-06-16 DIAGNOSIS — G4730 Sleep apnea, unspecified: Secondary | ICD-10-CM

## 2015-06-16 MED ORDER — FUROSEMIDE 20 MG PO TABS: 20.0000 mg | ORAL_TABLET | Freq: Every day | ORAL | Status: DC

## 2015-06-16 MED ORDER — FOSINOPRIL SODIUM 20 MG PO TABS: 20.0000 mg | ORAL_TABLET | Freq: Every day | ORAL | Status: DC

## 2015-06-16 MED ORDER — SPIRONOLACTONE 50 MG PO TABS: ORAL_TABLET | ORAL | Status: DC

## 2015-07-03 ENCOUNTER — Encounter: Payer: Medicaid Other | Admitting: Internal Medicine

## 2015-07-04 ENCOUNTER — Encounter: Payer: Self-pay | Admitting: Internal Medicine

## 2015-07-16 ENCOUNTER — Other Ambulatory Visit: Payer: Self-pay

## 2015-07-16 DIAGNOSIS — I420 Dilated cardiomyopathy: Secondary | ICD-10-CM

## 2015-07-16 DIAGNOSIS — I5022 Chronic systolic (congestive) heart failure: Secondary | ICD-10-CM

## 2015-07-16 DIAGNOSIS — G4730 Sleep apnea, unspecified: Secondary | ICD-10-CM

## 2015-07-16 MED ORDER — FOSINOPRIL SODIUM 20 MG PO TABS: 20.0000 mg | ORAL_TABLET | Freq: Every day | ORAL | Status: DC

## 2015-07-16 MED ORDER — SPIRONOLACTONE 50 MG PO TABS: ORAL_TABLET | ORAL | Status: DC

## 2015-07-16 MED ORDER — FUROSEMIDE 20 MG PO TABS: 20.0000 mg | ORAL_TABLET | Freq: Every day | ORAL | Status: DC

## 2015-07-24 ENCOUNTER — Ambulatory Visit (INDEPENDENT_AMBULATORY_CARE_PROVIDER_SITE_OTHER): Payer: Medicaid Other | Admitting: Internal Medicine

## 2015-07-24 ENCOUNTER — Encounter: Payer: Self-pay | Admitting: Internal Medicine

## 2015-07-24 VITALS — BP 118/80 | HR 69 | Ht 70.0 in | Wt 273.2 lb

## 2015-07-24 DIAGNOSIS — I428 Other cardiomyopathies: Secondary | ICD-10-CM

## 2015-07-24 DIAGNOSIS — Z79899 Other long term (current) drug therapy: Secondary | ICD-10-CM

## 2015-07-24 DIAGNOSIS — I429 Cardiomyopathy, unspecified: Secondary | ICD-10-CM

## 2015-07-24 DIAGNOSIS — Z4502 Encounter for adjustment and management of automatic implantable cardiac defibrillator: Secondary | ICD-10-CM

## 2015-07-24 DIAGNOSIS — Z95810 Presence of automatic (implantable) cardiac defibrillator: Secondary | ICD-10-CM | POA: Diagnosis not present

## 2015-07-24 DIAGNOSIS — I5022 Chronic systolic (congestive) heart failure: Secondary | ICD-10-CM

## 2015-07-24 LAB — CUP PACEART INCLINIC DEVICE CHECK
Brady Statistic RA Percent Paced: 10 %
Date Time Interrogation Session: 20160818040000
HighPow Impedance: 44 Ohm
HighPow Impedance: 63 Ohm
Lead Channel Impedance Value: 549 Ohm
Lead Channel Impedance Value: 628 Ohm
Lead Channel Pacing Threshold Amplitude: 0.7 V
Lead Channel Pacing Threshold Pulse Width: 0.4 ms
Lead Channel Pacing Threshold Pulse Width: 0.4 ms
Lead Channel Pacing Threshold Pulse Width: 0.4 ms
Lead Channel Sensing Intrinsic Amplitude: 18.8 mV
Lead Channel Sensing Intrinsic Amplitude: 4.9 mV
Lead Channel Sensing Intrinsic Amplitude: 9.9 mV
Lead Channel Setting Pacing Amplitude: 2.4 V
Lead Channel Setting Pacing Pulse Width: 0.4 ms
Lead Channel Setting Sensing Sensitivity: 1 mV
MDC IDC MSMT LEADCHNL RA PACING THRESHOLD AMPLITUDE: 1.1 V
MDC IDC MSMT LEADCHNL RV IMPEDANCE VALUE: 472 Ohm
MDC IDC MSMT LEADCHNL RV PACING THRESHOLD AMPLITUDE: 1 V
MDC IDC SET LEADCHNL LV PACING AMPLITUDE: 2 V
MDC IDC SET LEADCHNL RA PACING AMPLITUDE: 2 V
MDC IDC SET LEADCHNL RV PACING PULSEWIDTH: 0.4 ms
MDC IDC SET LEADCHNL RV SENSING SENSITIVITY: 0.5 mV
MDC IDC STAT BRADY RV PERCENT PACED: 99 %
Pulse Gen Serial Number: 480425
Zone Setting Detection Interval: 250 ms
Zone Setting Detection Interval: 286 ms
Zone Setting Detection Interval: 316 ms

## 2015-07-24 NOTE — Progress Notes (Signed)
[name]      Patient Care Team: Burtis Junes, MD as PCP - General (General Surgery)   HPI  Spencer Robertson is a 54 y.o. male is seen today in followup for nonischemic cardiomyopathy with congestive heart failure and left bundle branch block, and status post CRT-D implantation originally in November 2005 with change out September 2011. The procedure was complicated by high DFT and he was started on sotalol with repeat testing demonstrating adequate defibrillation margins.  The patient denies , chest pain,   or palpitations;   He does have periodic episodes of shortness of breath which are accompanied by episodes of peripheral edema. He has not sure that there is an association with changes in his salt intake  He is tolerating his medications well. He has gotten his Medicaid card  he has run out of some of his medications. He said the office was called but didn't respond in a timely fashion.   He is mostly sedentary.  He has sleep apnea but his machine is no longer working or is outdated.  No further orthostatic lightheadedness.   Past Medical History  Diagnosis Date  . Hypertension   . Nonischemic cardiomyopathy   . Mitral regurgitation   . Hyperlipidemia   . Tobacco abuse   . S/P implantation of automatic cardioverter/defibrillator (AICD)     Guidant Contact H177  . OSA (obstructive sleep apnea)     severe  . Obesity     Past Surgical History  Procedure Laterality Date  . Cardiac defibrillator placement      Guidant Contak H177 device     Current Outpatient Prescriptions  Medication Sig Dispense Refill  . aspirin 81 MG tablet Take 81 mg by mouth daily.      . carvedilol (COREG) 25 MG tablet Take 1 tablet (25 mg total) by mouth 2 (two) times daily with a meal. 60 tablet 6  . fosinopril (MONOPRIL) 20 MG tablet Take 1 tablet (20 mg total) by mouth daily. 30 tablet 0  . furosemide (LASIX) 20 MG tablet Take 1 tablet (20 mg total) by mouth daily. 30 tablet 0  .  Magnesium Oxide 400 MG CAPS Take 400 mg daily 30 capsule 6  . mometasone (NASONEX) 50 MCG/ACT nasal spray 2 sprays each nostril each AM 17 g 3  . sotalol (BETAPACE) 80 MG tablet Take 1 tablet (80 mg total) by mouth 2 (two) times daily. 60 tablet 6  . spironolactone (ALDACTONE) 50 MG tablet Take 1/2 tablet by mouth once daily 15 tablet 0   No current facility-administered medications for this visit.    No Known Allergies  Review of Systems negative except from HPI and PMH  Physical Exam BP 118/80 mmHg  Pulse 69  Ht  (1.778 m)  Wt 273 lb 3.2 oz (123.923 kg)  BMI 39.20 kg/m2 Well developed and well nourished in no acute distress HENT normal E scleral and icterus clear Neck Supple JVP flat; carotids brisk and full Clear to ausculation  Regular rate and rhythm, no murmurs gallops or rub Soft with active bowel sounds No clubbing cyanosis none Edema Alert and oriented, grossly normal motor and sensory function Skin Warm and Dry  ECG demonstrates P. synchronous pacing with a stable QRS configuration and the duration of 140 ms compared to post implant tracing.  Assessment and  Plan  NICM  CHF chronic systolic  Hypertension  Morbidobesity  Sleep apnea  Atrial tachycardia  Cigarette abuse  disucssed  He continues to  smoke  Have referred to [employer] QUITNOW for state program to assist   Nonsustained ventricular tachycardia  Implantable defibrillator-CRT-[employer]  The patient's device was interrogated.  The information was reviewed. No changes were made in the programming.    He has untreated sleep apnea. We will refer him to Dr. Mayford Knife.  He is euvolemic.  BP well controlled  With his cardiomyopathy, we will want to think about switching him from fosinoprill--Entresto. We Will Check His Surveillance Laboratories for Aldactone and Sotalol Today.  No intercurrent Ventricular tachycardia  Fu AS 6 months  No meds question

## 2015-07-24 NOTE — Patient Instructions (Addendum)
Medication Instructions:  Your physician recommends that you continue on your current medications as directed. Please refer to the Current Medication list given to you today.  Labwork: Medication surveillance labs today: BMET & Magnesium  Testing/Procedures: None ordered  Follow-Up: You have been referred to Dr. Mayford Knife for management of your sleep apnea.  Remote monitoring is used to monitor your Pacemaker of ICD from home. This monitoring reduces the number of office visits required to check your device to one time per year. It allows [place] to keep an eye on the functioning of your device to ensure it is working properly. You are scheduled for a device check from home on 10/23/15. You may send your transmission at any time that day. If you have a wireless device, the transmission will be sent automatically. After your physician reviews your transmission, you will receive a postcard with your next transmission date.  Your physician wants you to follow-up in: 6 months with Gypsy Balsam, NP.  You will receive a reminder letter in the mail two months in advance. If you don't receive a letter, please call our office to schedule the follow-up appointment.  Your physician wants you to follow-up in: 1 year with Dr. Graciela Husbands.  You will receive a reminder letter in the mail two months in advance. If you don't receive a letter, please call our office to schedule the follow-up appointment.  Any Other Special Instructions Will Be Listed Below (If Applicable). Thank you for choosing [facility]!!

## 2015-07-25 ENCOUNTER — Telehealth: Payer: Self-pay | Admitting: *Deleted

## 2015-07-25 LAB — MAGNESIUM: Magnesium: 1.7 mg/dL (ref 1.5–2.5)

## 2015-07-25 LAB — BASIC METABOLIC PANEL
BUN: 12 mg/dL (ref 6–23)
CO2: 29 meq/L (ref 19–32)
Calcium: 9.7 mg/dL (ref 8.4–10.5)
Chloride: 100 mEq/L (ref 96–112)
Creatinine, Ser: 1.03 mg/dL (ref 0.40–1.50)
GFR: 97.03 mL/min (ref >60.00)
GLUCOSE: 34 mg/dL — AB (ref 70–99)
Potassium: 4.7 mEq/L (ref 3.5–5.1)
Sodium: 138 mEq/L (ref 135–145)

## 2015-07-25 NOTE — Telephone Encounter (Signed)
Received a call from Shaniqua at the lab Patient has a critical glucose level of 34, labs done yesterday afternoon  Will forward to [employer]

## 2015-07-25 NOTE — Telephone Encounter (Signed)
lmtcb  (Left message to check on patient.  He is not diabetic according to our records)

## 2015-08-12 ENCOUNTER — Telehealth: Payer: Self-pay

## 2015-08-12 DIAGNOSIS — I5022 Chronic systolic (congestive) heart failure: Secondary | ICD-10-CM

## 2015-08-12 DIAGNOSIS — G4730 Sleep apnea, unspecified: Secondary | ICD-10-CM

## 2015-08-12 DIAGNOSIS — I420 Dilated cardiomyopathy: Secondary | ICD-10-CM

## 2015-08-12 MED ORDER — FUROSEMIDE 20 MG PO TABS: 20.0000 mg | ORAL_TABLET | Freq: Every day | ORAL | Status: DC

## 2015-08-12 MED ORDER — CARVEDILOL 25 MG PO TABS: 25.0000 mg | ORAL_TABLET | Freq: Two times a day (BID) | ORAL | Status: DC

## 2015-08-12 MED ORDER — SOTALOL HCL 80 MG PO TABS: 80.0000 mg | ORAL_TABLET | Freq: Two times a day (BID) | ORAL | Status: DC

## 2015-08-12 NOTE — Telephone Encounter (Signed)
Pt would like return call from nurse due to telephone note from 07/25/15 [phone]   Also pt wanted refill of medications but stated he was not at home and did not have medicine bottles with him and could only recall 3 medications he needed refilled. I refilled them and asked him to cal back when he got home to have the rest of medications refilled.  Duke Salvia, MD at 07/24/2015 4:31 PM  carvedilol (COREG) 25 MG tablet Take 1 tablet (25 mg total) by mouth 2 (two) times daily with a meal furosemide (LASIX) 20 MG tablet Take 1 tablet (20 mg total) by mouth daily sotalol (BETAPACE) 80 MG tablet Take 1 tablet (80 mg total) by mouth 2 (two) times daily  Patient Instructions:   Medication Instructions:  Your physician recommends that you continue on your current medications as directed. Please refer to the Current Medication list given to you today.

## 2015-08-12 NOTE — Telephone Encounter (Signed)
lmtcb on home and cell phone

## 2015-08-14 ENCOUNTER — Telehealth: Payer: Self-pay

## 2015-08-14 DIAGNOSIS — I420 Dilated cardiomyopathy: Secondary | ICD-10-CM

## 2015-08-14 DIAGNOSIS — G4730 Sleep apnea, unspecified: Secondary | ICD-10-CM

## 2015-08-14 NOTE — Telephone Encounter (Signed)
Pt is requesting his Spironolactone and Fosinopril. Does Dr.Klein want to fill this medicine for him? Please see note on 07/24/2015 regarding check levels on these two med.  Thanks

## 2015-08-15 ENCOUNTER — Other Ambulatory Visit: Payer: Self-pay | Admitting: *Deleted

## 2015-08-15 DIAGNOSIS — I420 Dilated cardiomyopathy: Secondary | ICD-10-CM

## 2015-08-15 DIAGNOSIS — G4730 Sleep apnea, unspecified: Secondary | ICD-10-CM

## 2015-08-15 MED ORDER — FOSINOPRIL SODIUM 20 MG PO TABS: 20.0000 mg | ORAL_TABLET | Freq: Every day | ORAL | Status: DC

## 2015-08-15 MED ORDER — SPIRONOLACTONE 50 MG PO TABS: ORAL_TABLET | ORAL | Status: DC

## 2015-08-15 NOTE — Telephone Encounter (Signed)
Patient called about medication refills but could not tell me which ones and said he has three of his medications. I asked if it was the three I sent in and he stated he does not know. He just kept saying he needs his medications and didn't know which ones. I asked about Fosinopril and Aldactone and he just got upset and said he didn't know which medications and then hung up. Sherri RN called and left message for patient to call her back.

## 2015-08-18 NOTE — Telephone Encounter (Addendum)
lmtcb  (per Graciela Husbands: need to stop Fosinopril and start Entresto 49/51 mg, 36 hours later)

## 2015-09-01 ENCOUNTER — Ambulatory Visit (INDEPENDENT_AMBULATORY_CARE_PROVIDER_SITE_OTHER): Payer: Medicaid Other | Admitting: Cardiology

## 2015-09-01 ENCOUNTER — Encounter: Payer: Self-pay | Admitting: Cardiology

## 2015-09-01 VITALS — BP 99/65 | HR 64 | Ht 70.0 in | Wt 275.0 lb

## 2015-09-01 DIAGNOSIS — G4733 Obstructive sleep apnea (adult) (pediatric): Secondary | ICD-10-CM

## 2015-09-01 DIAGNOSIS — E669 Obesity, unspecified: Secondary | ICD-10-CM

## 2015-09-01 DIAGNOSIS — I10 Essential (primary) hypertension: Secondary | ICD-10-CM | POA: Diagnosis not present

## 2015-09-01 HISTORY — DX: Obesity, unspecified: E66.9

## 2015-09-01 NOTE — Progress Notes (Signed)
Cardiology Office Note   Date:  09/01/2015   ID:  Spencer Robertson, DOB [birth year], MRN [id number]  PCP:  Burtis Junes, MD    Chief Complaint  Patient presents with  . New Evaluation    OSA      History of Present Illness: Spencer Robertson is a 54 y.o. male who presents for evaluation of OSA.  He has a history of nonischemic cardiomyopathy with congestive heart failure and left bundle branch block, and status post CRT-D implantation originally in November 2005 with change out September 2011.  He has a history of severe OSA and had been on C CPAP therapy in the past but has not been using a machine in a few years.  He says that the CPAP machine caused him cough.  He had using a full face mask that he did not like because it was uncomfortable.  He says he snores and wakes up gasping for breath.  He feels tired in the am after sleeping but does not nap during the day.  He denies any am headaches.  He says that he breathes through his mouth and nose.  He smokes intermittently.  He drinks alcohol only on the weekends up to a pint.     Past Medical History  Diagnosis Date  . Hypertension   . Nonischemic cardiomyopathy   . Mitral regurgitation   . Hyperlipidemia   . Tobacco abuse   . S/P implantation of automatic cardioverter/defibrillator (AICD)     Guidant Contact H177  . OSA (obstructive sleep apnea)     severe  . Obesity   . Obesity (BMI 30-39.9) 09/01/2015    Past Surgical History  Procedure Laterality Date  . Cardiac defibrillator placement      Guidant Contak H177 device      Current Outpatient Prescriptions  Medication Sig Dispense Refill  . aspirin 81 MG tablet Take 81 mg by mouth daily.      . carvedilol (COREG) 25 MG tablet Take 1 tablet (25 mg total) by mouth 2 (two) times daily with a meal. 180 tablet 2  . fosinopril (MONOPRIL) 20 MG tablet Take 1 tablet (20 mg total) by mouth daily. 30 tablet 0  . furosemide (LASIX) 20 MG tablet Take 1  tablet (20 mg total) by mouth daily. 90 tablet 2  . Magnesium Oxide 400 MG CAPS Take 400 mg daily 30 capsule 6  . mometasone (NASONEX) 50 MCG/ACT nasal spray 2 sprays each nostril each AM 17 g 3  . sotalol (BETAPACE) 80 MG tablet Take 1 tablet (80 mg total) by mouth 2 (two) times daily. 180 tablet 2  . spironolactone (ALDACTONE) 50 MG tablet Take 1/2 tablet by mouth once daily 15 tablet 1   No current facility-administered medications for this visit.    Allergies:   Review of patient's allergies indicates no known allergies.    Social History:  The patient  reports that he has been smoking Cigarettes.  He has a 5 pack-year smoking history. He has never used smokeless tobacco. He reports that he drinks alcohol. He reports that he uses illicit drugs (Marijuana).   Family History:  The patient's family history includes Cardiomyopathy in his father; Heart disease in his brother and sister; Hypertension in an other family member.    ROS:  Please see the history of present illness.   Otherwise, review of systems are positive for none.  All other systems are reviewed and negative.    PHYSICAL EXAM: VS:  BP 99/65 mmHg  Pulse 64  Ht 5\' 10"  (1.778 m)  Wt 275 lb (124.739 kg)  BMI 39.46 kg/m2  SpO2 91% , BMI Body mass index is 39.46 kg/(m^2). GEN: Well nourished, well developed, in no acute distress HEENT: normal Neck: no JVD, carotid bruits, or masses Cardiac: RRR; no murmurs, rubs, or gallops,no edema  Respiratory:  clear to auscultation bilaterally, normal work of breathing GI: soft, nontender, nondistended, + BS MS: no deformity or atrophy Skin: warm and dry, no rash Neuro:  Strength and sensation are intact Psych: euthymic mood, full affect   EKG:  EKG is not ordered today.    Recent Labs: 07/24/2015: BUN 12; Creatinine, Ser 1.03; Magnesium 1.7; Potassium 4.7; Sodium 138    Lipid Panel    Component Value Date/Time   CHOL  09/13/2007 0405    162        ATP III  CLASSIFICATION:  <200     mg/dL   Desirable  155-208  mg/dL   Borderline High  >=022    mg/dL   High   TRIG 77 33/61/2244 0405   HDL 46 09/13/2007 0405   CHOLHDL 3.5 09/13/2007 0405   VLDL 15 09/13/2007 0405   LDLCALC * 09/13/2007 0405    101        Total Cholesterol/HDL:CHD Risk Coronary Heart Disease Risk Table                     Men   Women  1/2 Average Risk   3.4   3.3      Wt Readings from Last 3 Encounters:  09/01/15 275 lb (124.739 kg)  07/24/15 273 lb 3.2 oz (123.923 kg)  11/06/14 282 lb (127.914 kg)       ASSESSMENT AND PLAN:  1.  Severe OSA - this was diagnosed years ago and has not been wearing CPAP recently.  He has nonrestorative sleep, snoring, witness apnea and awakening gasping for breath.  His oropharynx is small but no enlarged tonsils or uvula.  I have recommended proceeding with a split night PSG.  I have also instructed the patient on proper sleep hygiene, avoidance of sleeping in the supine position and avoidance of alcohol within 4 hours of bedtime.  The patient was also instructed to avoid driving if sleepy.   2.  DCM - appears euvolemic on exam 3.  HTN - controlled 4.  Obesity - we discussed the importance of getting into a routine exercise program where he tries to walk briskly at least 30 minutes 4-5 times weekly and follow a low carb diet and watch portions.    Current medicines are reviewed at length with the patient today.  The patient does not have concerns regarding medicines.  The following changes have been made:  no change  Labs/ tests ordered today: See above Assessment and Plan No orders of the defined types were placed in this encounter.     Disposition:   FU with me PRN pending results of study SignedQuintella Reichert, MD  09/01/2015 9:54 AM    [facility] [address] [place], [place], [place]  [address] Phone: [phone]; Fax: [phone]

## 2015-09-01 NOTE — Patient Instructions (Signed)
Medication Instructions:  Your physician recommends that you continue on your current medications as directed. Please refer to the Current Medication list given to you today.   Labwork: None  Testing/Procedures: Your physician has recommended that you have a sleep study. This test records several body functions during sleep, including: brain activity, eye movement, oxygen and carbon dioxide blood levels, heart rate and rhythm, breathing rate and rhythm, the flow of air through your mouth and nose, snoring, body muscle movements, and chest and belly movement.  Follow-Up: Your physician recommends that you schedule a follow-up appointment AS NEEDED with Dr. Turner pending your sleep study results.  Any Other Special Instructions Will Be Listed Below (If Applicable).   

## 2015-09-10 ENCOUNTER — Telehealth: Payer: Self-pay | Admitting: Cardiology

## 2015-09-10 NOTE — Telephone Encounter (Signed)
New problem   Pt has heard anything concerning his appt for his sleep study. Please call pt.

## 2015-09-11 ENCOUNTER — Encounter: Payer: Self-pay | Admitting: *Deleted

## 2015-09-11 NOTE — Telephone Encounter (Signed)
Spoke with patient.  He is aware that once his sleep study is scheduled he will receive a packet of information in the mail with the appointment date.

## 2015-09-11 NOTE — Telephone Encounter (Signed)
Left message for patient to call back  

## 2015-09-11 NOTE — Telephone Encounter (Signed)
Have attempted to reach this patient for one month now with no return call. I am mailing a letter to patient asking him to call office to discuss the recommendation below and his 07/24/15 lab results.

## 2015-09-16 ENCOUNTER — Other Ambulatory Visit: Payer: Self-pay | Admitting: Internal Medicine

## 2015-09-16 MED ORDER — FOSINOPRIL SODIUM 20 MG PO TABS: 20.0000 mg | ORAL_TABLET | Freq: Every day | ORAL | Status: DC

## 2015-09-18 ENCOUNTER — Telehealth: Payer: Self-pay | Admitting: Internal Medicine

## 2015-09-18 NOTE — Telephone Encounter (Signed)
New Message     Pt calling back to get lab results. Please call back and advise.

## 2015-10-23 ENCOUNTER — Encounter: Payer: Medicaid Other | Admitting: *Deleted

## 2015-10-23 ENCOUNTER — Other Ambulatory Visit: Payer: Self-pay

## 2015-10-23 ENCOUNTER — Telehealth: Payer: Self-pay | Admitting: Cardiology

## 2015-10-23 DIAGNOSIS — G4730 Sleep apnea, unspecified: Secondary | ICD-10-CM

## 2015-10-23 DIAGNOSIS — I420 Dilated cardiomyopathy: Secondary | ICD-10-CM

## 2015-10-23 MED ORDER — SPIRONOLACTONE 50 MG PO TABS: ORAL_TABLET | ORAL | Status: DC

## 2015-10-23 NOTE — Telephone Encounter (Signed)
LMOVM reminding pt to send remote transmission.   

## 2015-10-24 ENCOUNTER — Encounter: Payer: Self-pay | Admitting: Cardiology

## 2015-10-29 ENCOUNTER — Telehealth: Payer: Self-pay | Admitting: Internal Medicine

## 2015-10-29 NOTE — Telephone Encounter (Signed)
Called pt and left message informing pt that he has refills left on his medication of Spironolactone 50 mg tablets, that was refilled on 10/23/15, dispensing 15 tablets, with 1 refill. Which is a 30 day supply. I advised the pt to call his pharmacy to request a refill for his medication and if he has any other problems, questions or concerns to call the our office.

## 2015-10-29 NOTE — Telephone Encounter (Signed)
New message     *STAT* If patient is at the pharmacy, call can be transferred to refill team.   1. Which medications need to be refilled? (please list name of each medication and dose if known) spironolactone 50 mg    2. Which pharmacy/location (including street and city if local pharmacy) is medication to be sent to? [facility] on [address] [phone]   3. Do they need a 30 day or 90 day supply? 30 days

## 2015-11-04 ENCOUNTER — Ambulatory Visit (HOSPITAL_BASED_OUTPATIENT_CLINIC_OR_DEPARTMENT_OTHER): Payer: Medicaid Other | Attending: Cardiology

## 2015-12-05 ENCOUNTER — Telehealth: Payer: Self-pay | Admitting: *Deleted

## 2015-12-05 NOTE — Telephone Encounter (Signed)
LMOVM stating cell adapter application was denied due to a request that was already fulfilled Feb/2015. I instructed pt to call scheduling to coordinate device clinic checks in person every 3 mo.

## 2015-12-25 ENCOUNTER — Other Ambulatory Visit: Payer: Self-pay | Admitting: Internal Medicine

## 2016-02-04 ENCOUNTER — Telehealth: Payer: Self-pay

## 2016-02-04 NOTE — Telephone Encounter (Signed)
Prior auth initiated for Fosinopril 20mg . I will call back for a decision after it goes to review, in 24 hours.

## 2016-02-06 ENCOUNTER — Telehealth: Payer: Self-pay

## 2016-02-06 NOTE — Telephone Encounter (Signed)
Prior auth obtained for Fosinopril 20mg  through 02/01/2017. PA- [id number]. Pharmacy notified.

## 2016-03-31 ENCOUNTER — Ambulatory Visit (INDEPENDENT_AMBULATORY_CARE_PROVIDER_SITE_OTHER): Payer: Medicaid Other | Admitting: *Deleted

## 2016-03-31 DIAGNOSIS — Z4502 Encounter for adjustment and management of automatic implantable cardiac defibrillator: Secondary | ICD-10-CM | POA: Diagnosis not present

## 2016-04-01 NOTE — Progress Notes (Signed)
ICD check in clinic. Normal device function. Thresholds and sensing consistent with previous device measurements. Impedance trends stable over time. No evidence of any ventricular arrhythmias. No mode switches. PMT noted on device. VA conduction measured at . PVARP reprogrammed to . Histogram distribution appropriate for patient and level of activity. No changes made this session. Device programmed at appropriate safety margins. Device programmed to optimize intrinsic conduction. Estimated longevity 5.42yrs. ROV with SK 8/18.

## 2016-05-05 ENCOUNTER — Other Ambulatory Visit: Payer: Self-pay | Admitting: Internal Medicine

## 2016-06-02 ENCOUNTER — Other Ambulatory Visit: Payer: Self-pay | Admitting: Internal Medicine

## 2016-06-02 ENCOUNTER — Telehealth: Payer: Self-pay | Admitting: *Deleted

## 2016-06-02 NOTE — Telephone Encounter (Signed)
Spoke w/ and gave him the number to call patient services so he can order a home monitor and any accessories that he may need. Pt verbalized understanding.

## 2016-06-02 NOTE — Telephone Encounter (Signed)
LM with patient's sister requesting that he call the device clinic.  No health information disclosed due to lack of DPR.  Gave device clinic phone number for return call.  When patient returns call, will give phone number for [employer]" ([phone]) so that patient can order a new home monitor and/or adapter for his current home monitor.

## 2016-06-19 ENCOUNTER — Other Ambulatory Visit: Payer: Self-pay | Admitting: Internal Medicine

## 2016-07-23 ENCOUNTER — Encounter: Payer: Self-pay | Admitting: Internal Medicine

## 2016-07-23 ENCOUNTER — Ambulatory Visit (INDEPENDENT_AMBULATORY_CARE_PROVIDER_SITE_OTHER): Payer: Medicaid Other | Admitting: Internal Medicine

## 2016-07-23 ENCOUNTER — Encounter (INDEPENDENT_AMBULATORY_CARE_PROVIDER_SITE_OTHER): Payer: Self-pay

## 2016-07-23 VITALS — BP 122/74 | HR 79 | Ht 71.0 in | Wt 277.0 lb

## 2016-07-23 DIAGNOSIS — Z79899 Other long term (current) drug therapy: Secondary | ICD-10-CM

## 2016-07-23 DIAGNOSIS — I428 Other cardiomyopathies: Secondary | ICD-10-CM

## 2016-07-23 DIAGNOSIS — Z4502 Encounter for adjustment and management of automatic implantable cardiac defibrillator: Secondary | ICD-10-CM | POA: Diagnosis not present

## 2016-07-23 DIAGNOSIS — I429 Cardiomyopathy, unspecified: Secondary | ICD-10-CM | POA: Diagnosis not present

## 2016-07-23 DIAGNOSIS — I5022 Chronic systolic (congestive) heart failure: Secondary | ICD-10-CM | POA: Diagnosis not present

## 2016-07-23 LAB — CUP PACEART INCLINIC DEVICE CHECK
HIGH POWER IMPEDANCE MEASURED VALUE: 44 Ohm
HighPow Impedance: 66 Ohm
Implantable Lead Implant Date: 20051122
Implantable Lead Location: 753858
Implantable Lead Model: 4194
Implantable Lead Serial Number: 159458
Lead Channel Impedance Value: 491 Ohm
Lead Channel Impedance Value: 572 Ohm
Lead Channel Impedance Value: 633 Ohm
Lead Channel Pacing Threshold Amplitude: 0.8 V
Lead Channel Pacing Threshold Amplitude: 1 V
Lead Channel Pacing Threshold Pulse Width: 0.4 ms
Lead Channel Sensing Intrinsic Amplitude: 24.7 mV
Lead Channel Sensing Intrinsic Amplitude: 7.4 mV
Lead Channel Setting Pacing Amplitude: 2 V
Lead Channel Setting Pacing Amplitude: 2 V
Lead Channel Setting Pacing Pulse Width: 0.4 ms
Lead Channel Setting Sensing Sensitivity: 1 mV
MDC IDC LEAD IMPLANT DT: 20051122
MDC IDC LEAD IMPLANT DT: 20051122
MDC IDC LEAD LOCATION: 753859
MDC IDC LEAD LOCATION: 753860
MDC IDC LEAD MODEL: 158
MDC IDC MSMT LEADCHNL LV PACING THRESHOLD PULSEWIDTH: 0.4 ms
MDC IDC MSMT LEADCHNL RA PACING THRESHOLD AMPLITUDE: 1 V
MDC IDC MSMT LEADCHNL RA PACING THRESHOLD PULSEWIDTH: 0.4 ms
MDC IDC MSMT LEADCHNL RV SENSING INTR AMPL: 12.7 mV
MDC IDC PG SERIAL: 480425
MDC IDC SESS DTM: 20170818040000
MDC IDC SET LEADCHNL LV PACING PULSEWIDTH: 0.4 ms
MDC IDC SET LEADCHNL RV PACING AMPLITUDE: 2.4 V
MDC IDC SET LEADCHNL RV SENSING SENSITIVITY: 0.5 mV

## 2016-07-23 LAB — BASIC METABOLIC PANEL
BUN: 10 mg/dL (ref 7–25)
CALCIUM: 9.3 mg/dL (ref 8.6–10.3)
CO2: 28 mmol/L (ref 20–31)
Chloride: 102 mmol/L (ref 98–110)
Creat: 0.92 mg/dL (ref 0.70–1.33)
Glucose, Bld: 78 mg/dL (ref 65–99)
POTASSIUM: 3.9 mmol/L (ref 3.5–5.3)
SODIUM: 139 mmol/L (ref 135–146)

## 2016-07-23 LAB — MAGNESIUM: MAGNESIUM: 1.5 mg/dL (ref 1.5–2.5)

## 2016-07-23 NOTE — Patient Instructions (Addendum)
Medication Instructions: Your physician recommends that you continue on your current medications as directed. Please refer to the Current Medication list given to you today.   Labwork: TODAY : BMET and Magnesium  Procedures/Testing: None Ordered  Follow-Up: Your physician recommends that you schedule a follow-up appointment with Dr. Mayford Knife for re-evaluation of  sleep apnea  Your physician wants you to follow-up in 6 MONTHS with Francis Dowse and 1 YEAR with Dr. Graciela Husbands. You will receive a reminder letter in the mail two months in advance. If you don't receive a letter, please call our office to schedule the follow-up appointment.   Any Additional Special Instructions Will Be Listed Below (If Applicable).     If you need a refill on your cardiac medications before your next appointment, please call your pharmacy.

## 2016-07-23 NOTE — Progress Notes (Addendum)
[name]      Patient Care Team: No Pcp Per Patient as PCP - General (General Practice)   HPI  Spencer Robertson is a 55 y.o. male is seen today in followup for nonischemic cardiomyopathy with congestive heart failure and left bundle branch block, and status post CRT-D implantation originally in November 2005 with change out September 2011. The procedure was complicated by high DFT and he was started on sotalol with repeat testing demonstrating adequate defibrillation margins.  The patient denies , chest pain,   or palpitations;   He does have periodic episodes of shortness of breath which are accompanied by episodes of peripheral edema. He has not sure that there is an association with changes in his salt intake  He is tolerating his medications well. He has gotten his Medicaid card  he has run out of some of his medications. He said the office was called but didn't respond in a timely fashion.    He is mostly sedentary.  He has sleep apnea but his machine is no longer working or is outdated. He saw Dr. Mayford Knifeurner. However, he did not follow through. He would like to do that now.     Past Medical History:  Diagnosis Date  . Hyperlipidemia   . Hypertension   . Mitral regurgitation   . Nonischemic cardiomyopathy (HCC)   . Obesity   . Obesity (BMI 30-39.9) 09/01/2015  . OSA (obstructive sleep apnea)    severe  . S/P implantation of automatic cardioverter/defibrillator (AICD)    Guidant Contact H177  . Tobacco abuse     Past Surgical History:  Procedure Laterality Date  . CARDIAC DEFIBRILLATOR PLACEMENT     Guidant Contak H177 device     Current Outpatient Prescriptions  Medication Sig Dispense Refill  . aspirin 81 MG tablet Take 81 mg by mouth daily.      . carvedilol (COREG) 25 MG tablet Take 1 tablet (25 mg total) by mouth 2 (two) times daily with a meal. 180 tablet 2  . fosinopril (MONOPRIL) 20 MG tablet take 1 tablet by mouth once daily 30 tablet 3  . furosemide (LASIX)  20 MG tablet take 1 tablet by mouth once daily 90 tablet 0  . Magnesium Oxide 400 MG CAPS Take 400 mg daily 30 capsule 6  . mometasone (NASONEX) 50 MCG/ACT nasal spray 2 sprays each nostril each AM 17 g 3  . sotalol (BETAPACE) 80 MG tablet Take 1 tablet (80 mg total) by mouth 2 (two) times daily. 180 tablet 2  . spironolactone (ALDACTONE) 50 MG tablet take 1/2 tablet by mouth once daily 45 tablet 1   No current facility-administered medications for this visit.     No Known Allergies  Review of Systems negative except from HPI and PMH  Physical Exam BP 122/74   Pulse 79   Ht 5\' 11"  (1.803 m)   Wt 277 lb (125.6 kg)   BMI 38.63 kg/m  Well developed and well nourished in no acute distress HENT normal E scleral and icterus clear Neck Supple JVP unable to discern  carotids brisk and full Device pocket well healed; without hematoma or erythema.  There is no tethering .   Clear to ausculation  Regular rate and rhythm, no murmurs gallops or rub Soft with active bowel sounds No clubbing cyanosis no  Edema Alert and oriented, grossly normal motor and sensory function Skin Warm and Dry  ECG demonstrates P. synchronous pacing with a  Negative QRS in lead 1  negative QRS in lead V1. Reprogramming with a -20 ms LV offset resulted in a positive deflection in lead V1 Assessment and  Plan  NICM  CHF chronic systolic  Hypertension  Morbid obesity  Sleep apnea  Atrial tachycardia  Cigarette abuse  disucssed  He continues to smoke  Have referred to [employer] for state program to assist   Nonsustained ventricular tachycardia  Implantable defibrillator-CRT-[employer]  The patient's device was interrogated.  The information was reviewed. N Device was reprogramed to increase PVARP and lenghten minimal AV delay    His sleep apnea remains untreated. He would like to treated at this point. We will refer him back to Dr. Mayford Knife.  He is euvolemic.  BP well controlled  With his  cardiomyopathy, we will want to think about switching him from fosinoprill--Entresto. We Will Check His Surveillance Laboratories for Aldactone and Sotalol Today.  No intercurrent Ventricular tachycardia  Fu AS 6 months  No meds question

## 2016-07-28 ENCOUNTER — Telehealth: Payer: Self-pay | Admitting: Internal Medicine

## 2016-07-28 ENCOUNTER — Other Ambulatory Visit: Payer: Self-pay

## 2016-07-28 DIAGNOSIS — R790 Abnormal level of blood mineral: Secondary | ICD-10-CM

## 2016-07-28 DIAGNOSIS — Z79899 Other long term (current) drug therapy: Secondary | ICD-10-CM

## 2016-07-28 MED ORDER — MAGNESIUM OXIDE 400 MG PO CAPS: ORAL_CAPSULE | ORAL | 6 refills | Status: DC

## 2016-07-28 NOTE — Telephone Encounter (Signed)
**Note De-Identified Lynx Goodrich Obfuscation** See lab results from 8/18.

## 2016-07-28 NOTE — Telephone Encounter (Signed)
Mr. Salz is returning a call . Please call   Thanks

## 2016-08-18 ENCOUNTER — Other Ambulatory Visit: Payer: Medicaid Other | Admitting: *Deleted

## 2016-08-18 DIAGNOSIS — Z79899 Other long term (current) drug therapy: Secondary | ICD-10-CM

## 2016-08-18 LAB — BASIC METABOLIC PANEL
BUN: 14 mg/dL (ref 7–25)
CALCIUM: 9 mg/dL (ref 8.6–10.3)
CO2: 27 mmol/L (ref 20–31)
Chloride: 103 mmol/L (ref 98–110)
Creat: 1.02 mg/dL (ref 0.70–1.33)
GLUCOSE: 106 mg/dL — AB (ref 65–99)
Potassium: 4.3 mmol/L (ref 3.5–5.3)
Sodium: 139 mmol/L (ref 135–146)

## 2016-08-18 LAB — MAGNESIUM: Magnesium: 1.7 mg/dL (ref 1.5–2.5)

## 2016-08-24 ENCOUNTER — Telehealth: Payer: Self-pay

## 2016-08-24 NOTE — Telephone Encounter (Signed)
Called and spoke to patient sister. Sister informed me that he just stepped out. There is no DPR on file so I informed the sister that I could not speak with her so to have him call me back or I will try to reach him later today. She understood and was agreeable

## 2016-08-26 ENCOUNTER — Telehealth: Payer: Self-pay | Admitting: *Deleted

## 2016-08-26 DIAGNOSIS — R790 Abnormal level of blood mineral: Secondary | ICD-10-CM

## 2016-08-26 MED ORDER — MAGNESIUM OXIDE 400 MG PO CAPS
ORAL_CAPSULE | ORAL | 6 refills | Status: DC
Start: 2016-08-26 — End: 2017-09-14

## 2016-08-26 NOTE — Telephone Encounter (Signed)
Notes Recorded by Duke Salvia, MD on 08/23/2016 at 11:54 AM EDT Please Inform Patient that labs are normal x Mg is low--would increase Mg Oxide to BID  Thanks

## 2016-08-31 ENCOUNTER — Other Ambulatory Visit: Payer: Self-pay | Admitting: Internal Medicine

## 2016-08-31 DIAGNOSIS — I420 Dilated cardiomyopathy: Secondary | ICD-10-CM

## 2016-08-31 DIAGNOSIS — G4730 Sleep apnea, unspecified: Secondary | ICD-10-CM

## 2016-09-06 ENCOUNTER — Other Ambulatory Visit: Payer: Self-pay | Admitting: Internal Medicine

## 2016-09-14 ENCOUNTER — Other Ambulatory Visit: Payer: Self-pay | Admitting: Internal Medicine

## 2016-10-25 ENCOUNTER — Encounter: Payer: Medicaid Other | Admitting: *Deleted

## 2016-10-25 ENCOUNTER — Telehealth: Payer: Self-pay | Admitting: Cardiology

## 2016-10-25 NOTE — Telephone Encounter (Signed)
Confirmed remote transmission w/ pt mother.   

## 2016-10-27 ENCOUNTER — Encounter: Payer: Self-pay | Admitting: Cardiology

## 2016-11-10 ENCOUNTER — Ambulatory Visit (INDEPENDENT_AMBULATORY_CARE_PROVIDER_SITE_OTHER): Payer: Medicaid Other | Admitting: *Deleted

## 2016-11-10 DIAGNOSIS — I428 Other cardiomyopathies: Secondary | ICD-10-CM

## 2016-11-11 ENCOUNTER — Encounter: Payer: Self-pay | Admitting: Cardiology

## 2016-11-12 NOTE — Progress Notes (Signed)
Remote ICD transmission.   

## 2016-11-17 ENCOUNTER — Encounter: Payer: Self-pay | Admitting: Cardiology

## 2016-12-07 LAB — CUP PACEART REMOTE DEVICE CHECK
Battery Remaining Longevity: 54 mo
Battery Remaining Percentage: 85 %
Brady Statistic RA Percent Paced: 15 %
Brady Statistic RV Percent Paced: 99 %
Date Time Interrogation Session: 20171206190900
HIGH POWER IMPEDANCE MEASURED VALUE: 61 Ohm
Implantable Lead Implant Date: 20051122
Implantable Lead Location: 753858
Implantable Lead Model: 5076
Lead Channel Impedance Value: 552 Ohm
Lead Channel Pacing Threshold Amplitude: 0.8 V
Lead Channel Pacing Threshold Amplitude: 1 V
Lead Channel Pacing Threshold Pulse Width: 0.4 ms
Lead Channel Setting Pacing Amplitude: 2.4 V
Lead Channel Setting Sensing Sensitivity: 0.5 mV
MDC IDC LEAD IMPLANT DT: 20051122
MDC IDC LEAD IMPLANT DT: 20051122
MDC IDC LEAD LOCATION: 753859
MDC IDC LEAD LOCATION: 753860
MDC IDC LEAD MODEL: 158
MDC IDC LEAD MODEL: 4194
MDC IDC LEAD SERIAL: 159458
MDC IDC MSMT LEADCHNL LV IMPEDANCE VALUE: 631 Ohm
MDC IDC MSMT LEADCHNL RA PACING THRESHOLD PULSEWIDTH: 0.4 ms
MDC IDC MSMT LEADCHNL RV IMPEDANCE VALUE: 472 Ohm
MDC IDC MSMT LEADCHNL RV PACING THRESHOLD AMPLITUDE: 1 V
MDC IDC MSMT LEADCHNL RV PACING THRESHOLD PULSEWIDTH: 0.4 ms
MDC IDC PG IMPLANT DT: 20110831
MDC IDC SET LEADCHNL LV PACING AMPLITUDE: 2 V
MDC IDC SET LEADCHNL LV PACING PULSEWIDTH: 0.4 ms
MDC IDC SET LEADCHNL LV SENSING SENSITIVITY: 1 mV
MDC IDC SET LEADCHNL RA PACING AMPLITUDE: 2 V
MDC IDC SET LEADCHNL RV PACING PULSEWIDTH: 0.4 ms
Pulse Gen Serial Number: 480425

## 2016-12-20 ENCOUNTER — Other Ambulatory Visit: Payer: Self-pay | Admitting: Internal Medicine

## 2016-12-23 ENCOUNTER — Other Ambulatory Visit: Payer: Self-pay | Admitting: Internal Medicine

## 2016-12-24 NOTE — Telephone Encounter (Signed)
Medication Detail    Disp Refills Start End   spironolactone (ALDACTONE) 50 MG tablet 45 tablet 1 12/24/2016    Sig: take 1/2 tablet by mouth once daily   E-Prescribing Status: Receipt confirmed by pharmacy (12/24/2016 9:19 AM EST)   Pharmacy   [id number] - [place], [place] - [address]

## 2017-02-11 ENCOUNTER — Encounter: Payer: Self-pay | Admitting: Physician Assistant

## 2017-02-16 ENCOUNTER — Encounter: Payer: Medicaid Other | Admitting: Physician Assistant

## 2017-02-17 NOTE — Progress Notes (Signed)
Cardiology Office Note Date:  02/18/2017  Patient ID:  Spencer, Robertson [birth year], MRN [id number] PCP:  No PCP Per Patient  Cardiologist:  Dr. Graciela Husbands    Chief Complaint: routine in-clinic visit  History of Present Illness: Spencer Robertson is a 56 y.o. male with history of NICM, chronic CHF (systolic), LBBB w/CRT-D, initially had high DFT's that improved with the addition of Sotalol, OSA  , HTN, HLD, still smoking, Atrial tachycardia, morbid obesity.  He comes to the office today to be seen for Dr. Graciela Husbands.  He was last seen by him in August, at that time felt euvolemic and doing well with plans to follow up on his OSA, with consideration of changing ACE to entresto.  The patient reports today that he is doiong well, feels at his baseline with infrequent DOE, waxing/waning but minimal LE swelling, no CP, palpitations, no rest SOB, he admits to snoring but not symptoms of PND or orthopnea, no dizziness, near syncope or syncope, no rest SOB.  He did not get a sleep study done, states noon called him to set this up.  He continues to smoke, but says not daily, with having a cigarette once in a while only.  I discussed perhaps changing his ACE to [facility] but he feels like he is doing well with his current regime.  Device information: BSCi CRT-D, gen change 08/05/10, originally implanted 2005, Dr. Graciela Husbands AAD: sotalol added to improve DFT's   Past Medical History:  Diagnosis Date  . Hyperlipidemia   . Hypertension   . Mitral regurgitation   . Nonischemic cardiomyopathy (HCC)   . Obesity   . Obesity (BMI 30-39.9) 09/01/2015  . OSA (obstructive sleep apnea)    severe  . S/P implantation of automatic cardioverter/defibrillator (AICD)    Guidant Contact H177  . Tobacco abuse     Past Surgical History:  Procedure Laterality Date  . CARDIAC DEFIBRILLATOR PLACEMENT     Guidant Contak H177 device     Current Outpatient Prescriptions  Medication Sig Dispense Refill  . aspirin 81 MG tablet  Take 81 mg by mouth daily.      . carvedilol (COREG) 25 MG tablet take 1 tablet by mouth twice a day with meals 180 tablet 3  . fosinopril (MONOPRIL) 20 MG tablet take 1 tablet by mouth once daily 30 tablet 9  . furosemide (LASIX) 20 MG tablet take 1 tablet by mouth once daily 90 tablet 3  . Magnesium Oxide 400 MG CAPS Take 400 mg two times daily 60 capsule 6  . mometasone (NASONEX) 50 MCG/ACT nasal spray 2 sprays each nostril each AM 17 g 3  . sotalol (BETAPACE) 80 MG tablet take 1 tablet by mouth twice a day 180 tablet 3  . spironolactone (ALDACTONE) 50 MG tablet take 1/2 tablet by mouth once daily 45 tablet 1   No current facility-administered medications for this visit.     Allergies:   Patient has no known allergies.   Social History:  The patient  reports that he has been smoking Cigarettes.  He has a 5.00 pack-year smoking history. He has never used smokeless tobacco. He reports that he drinks alcohol. He reports that he uses drugs, including Marijuana.   Family History:  The patient's family history includes Cardiomyopathy in his father; Heart disease in his brother and sister.  ROS:  Please see the history of present illness.  All other systems are reviewed and otherwise negative.   PHYSICAL EXAM:  VS:  Ht 5\' 11"  (1.803 m)   Wt 286 lb (129.7 kg)   BMI 39.89 kg/m  BMI: Body mass index is 39.89 kg/m. Well nourished, obese,  well developed, in no acute distress  HEENT: normocephalic, atraumatic  Neck: no JVD, carotid bruits or masses Cardiac:   RRR; no significant murmurs, no rubs, or gallops Lungs:  CTA b/l no wheezing, rhonchi or rales  Abd: soft, nontender MS: no deformity or atrophy Ext: trace LE edema b/l  Skin: warm and dry, no rash Neuro:  No gross deficits appreciated Psych: euthymic mood, full affect  CD site is stable, no tethering or discomfort   EKG:  Done today shows is SR, BiVe paced, QTc is with QRs duration of ICD interrogation done today  with industry is reviewed by myself: stable lead and battery mesurements, PMT is noted by device, in review with industry this was an ATach/ST (x2), one NSVT  Recent Labs: 08/18/2016: BUN 14; Creat 1.02; Magnesium 1.7; Potassium 4.3; Sodium 139  No results found for requested labs within last 8760 hours.   CrCl cannot be calculated (Patient's most recent lab result is older than the maximum 21 days allowed.).   Wt Readings from Last 3 Encounters:  02/18/17 286 lb (129.7 kg)  07/23/16 277 lb (125.6 kg)  09/01/15 275 lb (124.7 kg)     Other studies reviewed: Additional studies/records reviewed today include: summarized above  ASSESSMENT AND PLAN:  1. CRT-D     Stable device function, no changes made     Continue Q 22month remote monitoring  2. Chronic CHF,  DCM     on BB/ACE, lasix/aldactone     Discussed consideration of changing his ACE to [facility], the patient feels like he is doing well with his current regime and not inclined to want to make chages      3. ATach    on device, 2 events noted as PMT felt to have been ST/AT, unknown duration  4. HTN     Stable, no changes  5. OSA by history, unchecked in years, no CPAP at home     Snoring, daytime sleepiness, morbid obesity     Will set him up with sleep study and follow up, he is asked that if he isn't called to schedule in the next couple weeks to call us and f/u  6. Smoking     Reported as infrequent, he is counseled  7. He is encouraged to get established with a PMD for routine and preventative care   Disposition: F/u with 3 month remote, 6 months with Dr. Graciela Husbands, will get a BMET and mag today given his meds.  Current medicines are reviewed at length with the patient today.  The patient did not have any concerns regarding medicines.  Judith Blonder, PA-C 02/18/2017 8:35 AM     [facility] [address] [place] [address] [phone] (office)  [phone] (fax)

## 2017-02-18 ENCOUNTER — Encounter (INDEPENDENT_AMBULATORY_CARE_PROVIDER_SITE_OTHER): Payer: Self-pay

## 2017-02-18 ENCOUNTER — Ambulatory Visit (INDEPENDENT_AMBULATORY_CARE_PROVIDER_SITE_OTHER): Payer: Medicaid Other | Admitting: Physician Assistant

## 2017-02-18 VITALS — Ht 71.0 in | Wt 286.0 lb

## 2017-02-18 DIAGNOSIS — I471 Supraventricular tachycardia: Secondary | ICD-10-CM | POA: Diagnosis not present

## 2017-02-18 DIAGNOSIS — Z95810 Presence of automatic (implantable) cardiac defibrillator: Secondary | ICD-10-CM

## 2017-02-18 DIAGNOSIS — Z79899 Other long term (current) drug therapy: Secondary | ICD-10-CM | POA: Diagnosis not present

## 2017-02-18 DIAGNOSIS — I420 Dilated cardiomyopathy: Secondary | ICD-10-CM

## 2017-02-18 DIAGNOSIS — I5022 Chronic systolic (congestive) heart failure: Secondary | ICD-10-CM

## 2017-02-18 DIAGNOSIS — I110 Hypertensive heart disease with heart failure: Secondary | ICD-10-CM

## 2017-02-18 DIAGNOSIS — G4730 Sleep apnea, unspecified: Secondary | ICD-10-CM | POA: Diagnosis not present

## 2017-02-18 NOTE — Patient Instructions (Addendum)
Medication Instructions:   Your physician recommends that you continue on your current medications as directed. Please refer to the Current Medication list given to you today.    If you need a refill on your cardiac medications before your next appointment, please call your pharmacy.  Labwork:  BMET AND MAG TODAY  TODAY    Testing/Procedures:  SOMEONE WILL CONTACT YOU FOR FURTHER STEPS.. Your physician has recommended that you have a sleep study. This test records several body functions during sleep, including: brain activity, eye movement, oxygen and carbon dioxide blood levels, heart rate and rhythm, breathing rate and rhythm, the flow of air through your mouth and nose, snoring, body muscle movements, and chest and belly movement.      Follow-Up:  Your physician wants you to follow-up in:  IN  6  MONTHS WITH DR  Logan Bores will receive a reminder letter in the mail two months in advance. If you don't receive a letter, please call our office to schedule the follow-up appointment.   Remote monitoring is used to monitor your Pacemaker of ICD from home. This monitoring reduces the number of office visits required to check your device to one time per year. It allows [place] to keep an eye on the functioning of your device to ensure it is working properly. You are scheduled for a device check from home on . 6-18-2018You may send your transmission at any time that day. If you have a wireless device, the transmission will be sent automatically. After your physician reviews your transmission, you will receive a postcard with your next transmission date.    Any Other Special Instructions Will Be Listed Below (If Applicable).   MONITOR YOUR WEIGHT DAILY  AND CONTACT us BACK IF YOU HAVE GAINED 3LBS IN 24 HOURS OR 5LBS IN A WEEK

## 2017-02-18 NOTE — Addendum Note (Signed)
Addended by: Tonita Phoenix on: 02/18/2017 08:53 AM   Modules accepted: Orders

## 2017-02-19 LAB — MAGNESIUM: Magnesium: 1.6 mg/dL (ref 1.6–2.3)

## 2017-02-19 LAB — BASIC METABOLIC PANEL
BUN/Creatinine Ratio: 10 (ref 9–20)
BUN: 9 mg/dL (ref 6–24)
CALCIUM: 9 mg/dL (ref 8.7–10.2)
CHLORIDE: 99 mmol/L (ref 96–106)
CO2: 25 mmol/L (ref 18–29)
CREATININE: 0.9 mg/dL (ref 0.76–1.27)
GFR, EST AFRICAN AMERICAN: 112 mL/min/{1.73_m2} (ref >59)
GFR, EST NON AFRICAN AMERICAN: 96 mL/min/{1.73_m2} (ref >59)
Glucose: 120 mg/dL — ABNORMAL HIGH (ref 65–99)
Potassium: 4.2 mmol/L (ref 3.5–5.2)
Sodium: 140 mmol/L (ref 134–144)

## 2017-02-22 ENCOUNTER — Telehealth: Payer: Self-pay | Admitting: *Deleted

## 2017-02-22 NOTE — Telephone Encounter (Signed)
Called to notify the patient of his upcoming sleep study but was unable to leave a message because there was no vm set up    By Reesa Chew, CMA

## 2017-03-01 ENCOUNTER — Telehealth: Payer: Self-pay

## 2017-03-01 NOTE — Telephone Encounter (Signed)
Prior auth initiated for Fosinopril 20mg  through [employer]. It needs to go to pharm D for review, and we should know in 24-48 hours.

## 2017-04-07 ENCOUNTER — Ambulatory Visit (HOSPITAL_BASED_OUTPATIENT_CLINIC_OR_DEPARTMENT_OTHER): Payer: Medicaid Other | Attending: Physician Assistant | Admitting: Cardiology

## 2017-04-07 VITALS — Ht 71.0 in | Wt 275.0 lb

## 2017-04-07 DIAGNOSIS — R0902 Hypoxemia: Secondary | ICD-10-CM | POA: Insufficient documentation

## 2017-04-07 DIAGNOSIS — G4733 Obstructive sleep apnea (adult) (pediatric): Secondary | ICD-10-CM | POA: Diagnosis not present

## 2017-04-07 DIAGNOSIS — G4730 Sleep apnea, unspecified: Secondary | ICD-10-CM | POA: Diagnosis present

## 2017-04-25 NOTE — Procedures (Signed)
Patient Name: Spencer Robertson, Primmer Date: 04/07/2017 Gender: Male D.O.B: 1961/04/02 Age (years): 56 Referring Provider: Brien Few Charlcie Cradle PA-C Height (inches): 14 Interpreting Physician: Fransico Him MD, ABSM Weight (lbs): 275 RPSGT: Baxter Flattery BMI: 38 MRN: [id number] Neck Size: 20.00  CLINICAL INFORMATION Sleep Study Type: Split Night CPAP  Indication for sleep study: Congestive Heart Failure, Excessive Daytime Sleepiness, Fatigue, Hypertension, Obesity, OSA, Snoring, Witnessed Apneas  Epworth Sleepiness Score: 8  SLEEP STUDY TECHNIQUE As per the AASM Manual for the Scoring of Sleep and Associated Events v2.3 (April 2016) with a hypopnea requiring 4% desaturations.  The channels recorded and monitored were frontal, central and occipital EEG, electrooculogram (EOG), submentalis EMG (chin), nasal and oral airflow, thoracic and abdominal wall motion, anterior tibialis EMG, snore microphone, electrocardiogram, and pulse oximetry. Continuous positive airway pressure (CPAP) was initiated when the patient met split night criteria and was titrated according to treat sleep-disordered breathing.  MEDICATIONS Medications self-administered by patient taken the night of the study : CARVEDILOL, SOTALOL  RESPIRATORY PARAMETERS Diagnostic Total AHI (/hr):100.1  RDI (/hr):100.1  OA Index (/hr): 39.8  CA Index (/hr): 40.4 REM AHI (/hr): 63.2  NREM AHI (/hr):104.3  Supine AHI (/hr):114.8  Non-supine AHI (/hr):99.05 Min O2 Sat (%):61.00  Mean O2 (%): 78.91  Time below 88% (min):79.4    Titration Optimal Pressure (cm):N/A   AHI at Optimal Pressure (/hr):N/A  Min O2 at Optimal Pressure (%):N/A Supine % at Optimal (%):N/A  Sleep % at Optimal (%):N/A    SLEEP ARCHITECTURE The recording time for the entire night was 425.9 minutes.  During a baseline period of 177.0 minutes, the patient slept for 93.5 minutes in REM and nonREM, yielding a sleep efficiency of 52.8%. Sleep onset after  lights out was 12.1 minutes with a REM latency of 123.0 minutes. The patient spent 85.56% of the night in stage N1 sleep, 4.28% in stage N2 sleep, 0.00% in stage N3 and 10.16% in REM.  During the titration period of 232.1 minutes, the patient slept for 196.5 minutes in REM and nonREM, yielding a sleep efficiency of 84.7%. Sleep onset after CPAP initiation was 10.3 minutes with a REM latency of 37.5 minutes. The patient spent 30.28% of the night in stage N1 sleep, 37.15% in stage N2 sleep, 0.00% in stage N3 and 32.57% in REM.  CARDIAC DATA The 2 lead EKG demonstrated sinus rhythm. The mean heart rate was 62.09 beats per minute. Other EKG findings include: PVCs.  LEG MOVEMENT DATA The total Periodic Limb Movements of Sleep (PLMS) were 0. The PLMS index was 0.00 .  IMPRESSIONS - Severe obstructive sleep apnea occurred during the diagnostic portion of the study (AHI = 100.1/hour). An optimal PAP pressure could not be selected for this patient based on the available study data. - Severe central sleep apnea occurred during the diagnostic portion of the study (CAI = (CAI = 40.4/hour). - Severe oxygen desaturation was noted during the diagnostic portion of the study (Min O2 = 61.00%). - The patient snored with Soft snoring volume during the diagnostic portion of the study. - EKG findings include PVCs. - Clinically significant periodic limb movements did not occur during sleep.  DIAGNOSIS - Obstructive Sleep Apnea (327.23 [G47.33 ICD-10]) - Nocturnal Hypoxemia (327.26 [G47.36 ICD-10])  RECOMMENDATIONS - Recommend BiPAP titration given sub-optimal CPAP titration. - Avoid alcohol, sedatives and other CNS depressants that may worsen sleep apnea and disrupt normal sleep architecture. - Sleep hygiene should be reviewed to assess factors that may improve sleep quality. -  Weight management and regular exercise should be initiated or continued.  [place], American Board of Sleep  Medicine  ELECTRONICALLY SIGNED ON:  04/25/2017, 2:32 PM [place] PH: [phone]   FX: [phone] [place]

## 2017-04-25 NOTE — Procedures (Deleted)
   Patient Name: Spencer Robertson Date: 04/24/2017 Gender: Male D.O.B: 07/28/1955 Age (years): 61 Referring Provider: Newman Nip Height (inches): 71 Interpreting Physician: Armanda Magic MD, ABSM Weight (lbs): 200 RPSGT: Melburn Popper BMI: 28 MRN: [id number] Neck Size: 17.00  CLINICAL INFORMATION Sleep Study Type: NPSG  Indication for sleep study: Excessive Daytime Sleepiness, Fatigue, Hypertension, Snoring, Witnessed Apneas  Epworth Sleepiness Score: 17  SLEEP STUDY TECHNIQUE As per the AASM Manual for the Scoring of Sleep and Associated Events v2.3 (April 2016) with a hypopnea requiring 4% desaturations.  The channels recorded and monitored were frontal, central and occipital EEG, electrooculogram (EOG), submentalis EMG (chin), nasal and oral airflow, thoracic and abdominal wall motion, anterior tibialis EMG, snore microphone, electrocardiogram, and pulse oximetry.  MEDICATIONS Medications self-administered by patient taken the night of the study : N/A  SLEEP ARCHITECTURE The study was initiated at 10:38:54 PM and ended at 5:09:35 AM.  Sleep onset time was 20.9 minutes and the sleep efficiency was 70.8%. The total sleep time was 276.7 minutes.  Stage REM latency was 356.5 minutes.  The patient spent 4.34% of the night in stage N1 sleep, 91.15% in stage N2 sleep, 0.00% in stage N3 and 4.52% in REM.  Alpha intrusion was absent.  Supine sleep was 20.96%.  RESPIRATORY PARAMETERS The overall apnea/hypopnea index (AHI) was 13.0 per hour. There were 36 total apneas, including 34 obstructive, 0 central and 2 mixed apneas. There were 24 hypopneas and 6 RERAs.  The AHI during Stage REM sleep was 0.0 per hour.  AHI while supine was 60.0 per hour.  The mean oxygen saturation was 92.13%. The minimum SpO2 during sleep was 82.00%.  Loud snoring was noted during this study.  CARDIAC DATA The 2 lead EKG demonstrated atrial flutter with variable block. The mean  heart rate was 70.98 beats per minute.   LEG MOVEMENT DATA The total PLMS were 75 with a resulting PLMS index of 16.26. Associated arousal with leg movement index was 0.9 .  IMPRESSIONS - Mild obstructive sleep apnea occurred during this study (AHI = 13.0/h). - No significant central sleep apnea occurred during this study (CAI = 0.0/h). - Mild oxygen desaturation was noted during this study (Min O2 = 82.00%). - The patient snored with Loud snoring volume. - Atrial flutter with variable block noted during this study. - Mild periodic limb movements of sleep occurred during the study. No significant associated arousals.  DIAGNOSIS - Obstructive Sleep Apnea (327.23 [G47.33 ICD-10]) - Atrial Flutter with variable block  RECOMMENDATIONS - Therapeutic CPAP titration to determine optimal pressure required to alleviate sleep disordered breathing. - Positional therapy avoiding supine position during sleep. - Avoid alcohol, sedatives and other CNS depressants that may worsen sleep apnea and disrupt normal sleep architecture. - Sleep hygiene should be reviewed to assess factors that may improve sleep quality. - Weight management and regular exercise should be initiated or continued if appropriate.  Armanda Magic Diplomate, American Board of Sleep Medicine  ELECTRONICALLY SIGNED ON:  04/25/2017, 12:53 PM [facility] PH: [phone]   FX: [phone] ACCREDITED BY THE AMERICAN ACADEMY OF SLEEP MEDICINE

## 2017-04-26 ENCOUNTER — Telehealth: Payer: Self-pay | Admitting: *Deleted

## 2017-04-26 DIAGNOSIS — G4733 Obstructive sleep apnea (adult) (pediatric): Secondary | ICD-10-CM

## 2017-04-26 NOTE — Telephone Encounter (Deleted)
-----   Message from Quintella Reichert, MD sent at 04/25/2017 12:56 PM EDT ----- Please let patient know that they have sleep apnea and recommend CPAP titration. Please set up titration in the sleep lab.

## 2017-04-26 NOTE — Telephone Encounter (Signed)
No voice mail could not leave message

## 2017-04-26 NOTE — Telephone Encounter (Signed)
-----   Message from Traci R Turner, MD sent at 04/25/2017 12:56 PM EDT ----- Please let patient know that they have sleep apnea and recommend CPAP titration. Please set up titration in the sleep lab. 

## 2017-04-28 ENCOUNTER — Encounter: Payer: Self-pay | Admitting: *Deleted

## 2017-05-05 NOTE — Telephone Encounter (Addendum)
Informed patient of sleep study results and patient understanding was verbalized. Patient understands his BIPAP TITRATION is scheduled for Friday July 01 2017. Patient understands his BIPAP TITRATION will be done at [facility] sleep lab. Patient understands he will receive a sleep packet in a week or so. Patient understands to call if he does not receive the sleep packet in a timely manner. Patient agrees with treatment and thanked me for call

## 2017-05-06 ENCOUNTER — Encounter: Payer: Self-pay | Admitting: *Deleted

## 2017-05-23 ENCOUNTER — Encounter: Payer: Medicaid Other | Admitting: *Deleted

## 2017-05-23 ENCOUNTER — Telehealth: Payer: Self-pay | Admitting: Cardiology

## 2017-05-23 NOTE — Telephone Encounter (Signed)
LMOVM reminding pt to send remote transmission.   

## 2017-05-24 ENCOUNTER — Emergency Department (HOSPITAL_COMMUNITY): Payer: Medicaid Other

## 2017-05-24 ENCOUNTER — Emergency Department (HOSPITAL_COMMUNITY)
Admission: EM | Admit: 2017-05-24 | Discharge: 2017-05-24 | Disposition: A | Discharge status: Home / Self Care | Payer: Medicaid Other | Attending: Emergency Medicine | Admitting: Emergency Medicine

## 2017-05-24 ENCOUNTER — Encounter (HOSPITAL_COMMUNITY): Payer: Self-pay | Admitting: *Deleted

## 2017-05-24 DIAGNOSIS — R0602 Shortness of breath: Secondary | ICD-10-CM | POA: Diagnosis present

## 2017-05-24 DIAGNOSIS — Z7982 Long term (current) use of aspirin: Secondary | ICD-10-CM | POA: Insufficient documentation

## 2017-05-24 DIAGNOSIS — Z95810 Presence of automatic (implantable) cardiac defibrillator: Secondary | ICD-10-CM | POA: Insufficient documentation

## 2017-05-24 DIAGNOSIS — Z79899 Other long term (current) drug therapy: Secondary | ICD-10-CM | POA: Insufficient documentation

## 2017-05-24 DIAGNOSIS — I5023 Acute on chronic systolic (congestive) heart failure: Secondary | ICD-10-CM | POA: Diagnosis not present

## 2017-05-24 DIAGNOSIS — Z87891 Personal history of nicotine dependence: Secondary | ICD-10-CM | POA: Diagnosis not present

## 2017-05-24 DIAGNOSIS — I110 Hypertensive heart disease with heart failure: Secondary | ICD-10-CM | POA: Insufficient documentation

## 2017-05-24 HISTORY — DX: Heart failure, unspecified: I50.9

## 2017-05-24 LAB — BASIC METABOLIC PANEL
Anion gap: 7 (ref 5–15)
BUN: 11 mg/dL (ref 6–20)
CHLORIDE: 102 mmol/L (ref 101–111)
CO2: 28 mmol/L (ref 22–32)
CREATININE: 1.05 mg/dL (ref 0.61–1.24)
Calcium: 8.8 mg/dL — ABNORMAL LOW (ref 8.9–10.3)
GFR calc Af Amer: >60 mL/min (ref >60)
GFR calc non Af Amer: >60 mL/min (ref >60)
GLUCOSE: 105 mg/dL — AB (ref 65–99)
POTASSIUM: 4.2 mmol/L (ref 3.5–5.1)
Sodium: 137 mmol/L (ref 135–145)

## 2017-05-24 LAB — CBC
HCT: 50.2 % (ref 39.0–52.0)
Hemoglobin: 16.8 g/dL (ref 13.0–17.0)
MCH: 32.1 pg (ref 26.0–34.0)
MCHC: 33.5 g/dL (ref 30.0–36.0)
MCV: 96 fL (ref 78.0–100.0)
Platelets: 129 K/uL — ABNORMAL LOW (ref 150–400)
RBC: 5.23 MIL/uL (ref 4.22–5.81)
RDW: 15.4 % (ref 11.5–15.5)
WBC: 4.2 K/uL (ref 4.0–10.5)

## 2017-05-24 LAB — I-STAT TROPONIN, ED: Troponin i, poc: 0.03 ng/mL (ref 0.00–0.08)

## 2017-05-24 LAB — BRAIN NATRIURETIC PEPTIDE: B Natriuretic Peptide: 562.7 pg/mL — ABNORMAL HIGH (ref 0.0–100.0)

## 2017-05-24 MED ORDER — FUROSEMIDE 10 MG/ML IJ SOLN
40.0000 mg | INTRAMUSCULAR | Status: AC
  Administered 2017-05-24: 40 mg via INTRAVENOUS
  Filled 2017-05-24: qty 4

## 2017-05-24 NOTE — ED Triage Notes (Signed)
Pt states that he began having SOB for "awhile". Pt states that it is worse with exertion. Pt states that he also has chest congestion that has been ongoing for over a month.

## 2017-05-24 NOTE — ED Notes (Signed)
Pt ambulated approx 15 steps with standby assist.   Highest O2 sat: 92% Lowest O2 sat: 88%  Pt denied any weakness, lightheadedness, dizziness, shortness of breath during such.

## 2017-05-24 NOTE — Discharge Instructions (Signed)
Please take two tablets of your Lasix (40 mg total) for four days. Please follow with your cardiologist as discussed.

## 2017-05-24 NOTE — ED Notes (Signed)
Called for patient in the waiting room. Pt did not respond.

## 2017-05-24 NOTE — ED Notes (Signed)
Patient given something to drink per EDP approval  

## 2017-05-24 NOTE — ED Provider Notes (Signed)
[facility]-EMERGENCY DEPT Provider Note   CSN: [id number] Arrival date & time: 05/24/17  1247     History   Chief Complaint Chief Complaint  Patient presents with  . Shortness of Breath    HPI Spencer Robertson is a 56 y.o. male.  Spencer Robertson is a 56 y.o. Male with a history of CHF, HTN, and AICD who presents to the ED complaining of shortness of breath ongoing for several months. He is a patient of Dr. Graciela Husbands. He last saw him about a year ago. He reports he started to get more short of breath about 6 months ago. He reports he came in today because his mother thought he should. He denies any acute symptoms today that prompted his ER visit. He reports he feels more short of breath with exertion. He notes some intermittent swelling that was worse over the weekend and has since improved some. He reports being compliant with his medications and takes 20 mg of Lasix daily. He denies any current shortness of breath while resting in the bed and denies any chest pain recently. He denies any changes to his medications recently. He also reports a painful area to his left nipple that has been ongoing for several months. He denies fevers, hemoptysis, syncope, lightheadedness, chest pain, vomiting, diarrhea, rashes.    The history is provided by the patient and medical records. No language interpreter was used.  Shortness of Breath  Associated symptoms include leg swelling. Pertinent negatives include no fever, no headaches, no sore throat, no neck pain, no cough, no wheezing, no chest pain, no vomiting, no abdominal pain and no rash.    Past Medical History:  Diagnosis Date  . CHF (congestive heart failure) (HCC)   . Hyperlipidemia   . Hypertension   . Mitral regurgitation   . Nonischemic cardiomyopathy (HCC)   . Obesity   . Obesity (BMI 30-39.9) 09/01/2015  . OSA (obstructive sleep apnea)    severe  . S/P implantation of automatic cardioverter/defibrillator (AICD)    Guidant Contact H177  .  Tobacco abuse     Patient Active Problem List   Diagnosis Date Noted  . Benign essential HTN 09/01/2015  . Obesity (BMI 30-39.9) 09/01/2015  . Orthostatic lightheadedness 01/18/2014  . Biventricular defibrillator - CRT-[employer] 12/10/2011  . Tobacco abuse   . CARDIOMYOPATHY, PRIMARY, DILATED 08/12/2009  . SYSTOLIC HEART FAILURE, CHRONIC 08/12/2009  . OSA (obstructive sleep apnea) 08/12/2009    Past Surgical History:  Procedure Laterality Date  . CARDIAC DEFIBRILLATOR PLACEMENT     Guidant Contak H177 device        Home Medications    Prior to Admission medications   Medication Sig Start Date End Date Taking? Authorizing Provider  aspirin 81 MG tablet Take 81 mg by mouth daily.     Yes [provider]  carvedilol (COREG) 25 MG tablet take 1 tablet by mouth twice a day with meals Patient taking differently: Take 25 mg by mouth two times a day 08/31/16  Yes Duke Salvia, MD  fosinopril (MONOPRIL) 20 MG tablet take 1 tablet by mouth once daily Patient taking differently: Take 20 mg by mouth once a day 09/14/16  Yes Duke Salvia, MD  furosemide (LASIX) 20 MG tablet take 1 tablet by mouth once daily Patient taking differently: Take 20 mg by mouth in the morning 09/07/16  Yes Duke Salvia, MD  Magnesium Oxide 400 MG CAPS Take 400 mg two times daily Patient taking differently: Take  400 mg by mouth 2 (two) times daily.  08/26/16  Yes Duke Salvia, MD  sotalol (BETAPACE) 80 MG tablet take 1 tablet by mouth twice a day Patient taking differently: Take 80 mg by mouth two times a day 08/31/16  Yes Duke Salvia, MD  spironolactone (ALDACTONE) 50 MG tablet take 1/2 tablet by mouth once daily Patient taking differently: Take 25 mg by mouth once a day 12/24/16  Yes Duke Salvia, MD  mometasone (NASONEX) 50 MCG/ACT nasal spray 2 sprays each nostril each AM Patient not taking: Reported on 05/24/2017 01/15/13   Barbaraann Share, MD    Family History Family  History  Problem Relation Age of Onset  . Cardiomyopathy Father   . Hypertension Unknown   . Heart disease Sister   . Heart disease Brother     Social History Social History  Substance Use Topics  . Smoking status: Current Some Day Smoker    Packs/day: 0.25    Years: 20.00    Types: Cigarettes  . Smokeless tobacco: Never Used  . Alcohol use Yes     Comment: socially     Allergies   Patient has no known allergies.   Review of Systems Review of Systems  Constitutional: Negative for chills and fever.  HENT: Negative for congestion and sore throat.   Eyes: Negative for visual disturbance.  Respiratory: Positive for shortness of breath. Negative for cough and wheezing.   Cardiovascular: Positive for leg swelling. Negative for chest pain and palpitations.  Gastrointestinal: Negative for abdominal pain, nausea and vomiting.  Genitourinary: Negative for dysuria.  Musculoskeletal: Negative for back pain and neck pain.  Skin: Negative for rash.  Neurological: Negative for dizziness, syncope, light-headedness and headaches.     Physical Exam Updated Vital Signs BP (!) 118/98   Pulse 75   Temp 98.1 F (36.7 C) (Oral)   Resp 20   SpO2 90%   Physical Exam  Constitutional: He appears well-developed and well-nourished. No distress.  Nontoxic appearing. Overweight male.  HENT:  Head: Normocephalic and atraumatic.  Mouth/Throat: Oropharynx is clear and moist.  Eyes: Conjunctivae are normal. Pupils are equal, round, and reactive to light. Right eye exhibits no discharge. Left eye exhibits no discharge.  Neck: Neck supple. No JVD present.  Cardiovascular: Normal rate, regular rhythm, normal heart sounds and intact distal pulses.  Exam reveals no gallop and no friction rub.   No murmur heard. Pulmonary/Chest: Effort normal and breath sounds normal. No stridor. No respiratory distress. He has no wheezes. He has no rales. He exhibits no tenderness.  Fine crackles noted to his  bilateral bases otherwise clear to ascultation. Symmetric chest expansion bilaterally. No increased work of breathing.  No overlying skin changes or palpable masses noted to his bilateral nipples.    Abdominal: Soft. There is no tenderness. There is no guarding.  Musculoskeletal: He exhibits edema.  Mild bilateral ankle edema. No calf edema or TTP.   Lymphadenopathy:    He has no cervical adenopathy.  Neurological: He is alert. Coordination normal.  Skin: Skin is warm and dry. Capillary refill takes less than 2 seconds. No rash noted. He is not diaphoretic. No erythema. No pallor.  Psychiatric: He has a normal mood and affect. His behavior is normal.  Nursing note and vitals reviewed.    ED Treatments / Results  Labs (all labs ordered are listed, but only abnormal results are displayed) Labs Reviewed  BASIC METABOLIC PANEL - Abnormal; Notable for the following:  Result Value   Glucose, Bld 105 (*)    Calcium 8.8 (*)    All other components within normal limits  CBC - Abnormal; Notable for the following:    Platelets 129 (*)    All other components within normal limits  BRAIN NATRIURETIC PEPTIDE - Abnormal; Notable for the following:    B Natriuretic Peptide 562.7 (*)    All other components within normal limits  I-STAT TROPOININ, ED    EKG  ED ECG REPORT   Date: 05/24/2017  Rate: 70  Rhythm: normal sinus rhythm  QRS Axis: normal  Intervals: normal  ST/T Wave abnormalities: nonspecific ST changes  Conduction Disutrbances:right bundle branch block  Narrative Interpretation:   Old EKG Reviewed: none available  I have personally reviewed the EKG tracing and agree with the computerized printout as noted.        Radiology Dg Chest 2 View  Result Date: 05/24/2017 CLINICAL DATA:  Shortness of breath with exertion, chest congestion, and peripheral edema for the past month. History of CHF, nonischemic cardiomyopathy, current smoker. EXAM: CHEST  2 VIEW COMPARISON:   Chest x-ray of August 06, 2010 FINDINGS: The lungs are adequately inflated. The interstitial markings are increased and are more conspicuous than on the previous study. The pulmonary vascularity is engorged. The cardiac silhouette is enlarged and more conspicuous today. The ICD is in stable position. B. observed bony thorax is unremarkable. IMPRESSION: CHF with mild pulmonary interstitial edema more conspicuous than on the previous study. No alveolar pneumonia. Electronically Signed   By: David  [place] M.D.   On: 05/24/2017 13:29    Procedures Procedures (including critical care time)  Medications Ordered in ED Medications  furosemide (LASIX) injection 40 mg (40 mg Intravenous Given 05/24/17 1613)     Initial Impression / Assessment and Plan / ED Course  I have reviewed the triage vital signs and the nursing notes.  Pertinent labs & imaging results that were available during my care of the patient were reviewed by me and considered in my medical decision making (see chart for details).    This is a 56 y.o. Male with a history of CHF, HTN, and AICD who presents to the ED complaining of shortness of breath ongoing for several months. He is a patient of Dr. Graciela Husbands. He last saw him about a year ago. He reports he started to get more short of breath about 6 months ago. He reports he came in today because his mother thought he should. He denies any acute symptoms today that prompted his ER visit. He reports he feels more short of breath with exertion. He notes some intermittent swelling that was worse over the weekend and has since improved some. He reports being compliant with his medications and takes 20 mg of Lasix daily. He denies any current shortness of breath while resting in the bed and denies any chest pain recently. He denies any changes to his medications recently. On exam the patient is afebrile nontoxic appearing. He has no increased work of breathing. Some fine crackles noted bilaterally.  Oxygen saturation is 95% on room air during my evaluation. EKG shows left atrial enlargement and a right bundle branch block. No previous EKG are coming up to compare. He denies any chest pain. Chest x-ray shows CHF with mild pulmonary interstitial edema. Troponin is not elevated. BMP is unremarkable. CBC is unremarkable. BNP is elevated at 562. Patient received 40 mg of IV Lasix. At reevaluation patient reports he is feeling much better.  He does not feel short of breath. He reports ambulating without feeling short of breath. He tells me he feels ready to be discharged. While patient was ambulated and was noted that his oxygen saturation dropped to 88% on room air. I discussed this with him and did offer admission. Patient declines. He tells me he is feeling much better and is ready to go home. He tells me he will return if he is feeling unwell or short of breath. I did call cardiology and they will call him for an appointment for follow-up. Patient agrees with this. Will have him take double his dose of Lasix to 40 mg a day for 4 days until he can follow-up with cardiology. Patient agrees with this plan. I discussed strict and specific return precautions. I advised the patient to follow-up with their primary care provider this week. I advised the patient to return to the emergency department with new or worsening symptoms or new concerns. The patient verbalized understanding and agreement with plan.    This patient was discussed with Dr. Jacqulyn Bath who agrees with assessment and plan.   Final Clinical Impressions(s) / ED Diagnoses   Final diagnoses:  Acute on chronic systolic congestive heart failure [facility])    New Prescriptions New Prescriptions   No medications on file     Jayceon, Troy, Cordelia Poche 05/24/17 1854    Long, Arlyss Repress, MD 05/25/17 1409

## 2017-05-27 ENCOUNTER — Telehealth: Payer: Self-pay | Admitting: Internal Medicine

## 2017-05-27 ENCOUNTER — Encounter: Payer: Self-pay | Admitting: Cardiology

## 2017-05-27 NOTE — Telephone Encounter (Signed)
New Message   pt verbalized that he is returning call for rn   pertaining to a referral that he wants from Dr.Klein

## 2017-05-27 NOTE — Telephone Encounter (Signed)
I attempted to call the patient. I was told by the person answering the phone he was not there. I asked them to please leave a message I called and he can call me back- office closes at 5 pm today.

## 2017-05-30 NOTE — Telephone Encounter (Signed)
New message    Pt calling back about referral

## 2017-05-30 NOTE — ED Notes (Signed)
Pt. Called and needed information on his dicharge instructions.  [facility] about [facility].

## 2017-05-30 NOTE — Telephone Encounter (Signed)
Attempted to contact pt via phone. NA/no voicemail set up yet.

## 2017-05-30 NOTE — Telephone Encounter (Signed)
I have attempted several times to contact pt at the # listed.  This number rings and sounds like a fax machine when it picks up.  I have attempted to call the # listed as his phone # and keep getting told he is not in and they will have him call back.

## 2017-05-30 NOTE — Telephone Encounter (Signed)
Follow up     Pt is calling back about referral.

## 2017-05-31 NOTE — Telephone Encounter (Signed)
Attempted to call the patient's cell #- sounds like a fax # when it rings- tried to call home #- no answer, no voice mail.

## 2017-06-01 ENCOUNTER — Ambulatory Visit (HOSPITAL_BASED_OUTPATIENT_CLINIC_OR_DEPARTMENT_OTHER): Payer: Medicaid Other | Attending: Cardiology | Admitting: Cardiology

## 2017-06-01 DIAGNOSIS — G4731 Primary central sleep apnea: Secondary | ICD-10-CM | POA: Diagnosis not present

## 2017-06-01 DIAGNOSIS — G4733 Obstructive sleep apnea (adult) (pediatric): Secondary | ICD-10-CM | POA: Diagnosis not present

## 2017-06-06 NOTE — Procedures (Signed)
   Patient Name: Spencer Robertson, Spencer Robertson Date: 06/01/2017 Gender: Male D.O.B: 01-Mar-1961 Age (years): 35 Referring Provider: Armanda Magic MD, ABSM Height (inches): 71 Interpreting Physician: Armanda Magic MD, ABSM Weight (lbs): 275 RPSGT: Armen Pickup BMI: 38 MRN: [id number] Neck Size: 20.00  CLINICAL INFORMATION The patient is referred for a BiPAP titration to treat sleep apnea. Date of NPSG, Split Night or HST:04/07/2017  SLEEP STUDY TECHNIQUE As per the AASM Manual for the Scoring of Sleep and Associated Events v2.3 (April 2016) with a hypopnea requiring 4% desaturations.  The channels recorded and monitored were frontal, central and occipital EEG, electrooculogram (EOG), submentalis EMG (chin), nasal and oral airflow, thoracic and abdominal wall motion, anterior tibialis EMG, snore microphone, electrocardiogram, and pulse oximetry. Bilevel positive airway pressure (BPAP) was initiated at the beginning of the study and titrated to treat sleep-disordered breathing.  MEDICATIONS Medications self-administered by patient taken the night of the study : CARVEDILOL, SOTALOL  RESPIRATORY PARAMETERS Optimal IPAP Pressure (cm): N/A  AHI at Optimal Pressure (/hr) N/A Optimal EPAP Pressure (cm):N/A     Overall Minimal O2 (%):73.00  Minimal O2 at Optimal Pressure (%): 77.0  SLEEP ARCHITECTURE Start Time:9:46:33 PM  Stop Time:4:29:54 AM Total Time (min):403.4  Total Sleep Time (min):366.9 Sleep Latency (min):13.4  Sleep Efficiency (%):91.0  REM Latency (min):42.0  WASO (min):23.0 Stage N1 (%): 3.13  Stage N2 (%): 63.21  Stage N3 (%): 0.00  Stage R (%):33.66 Supine (%):15.40  Arousal Index (/hr):22.6      CARDIAC DATA The 2 lead EKG demonstrated pacemaker generated. The mean heart rate was 60.86 beats per minute. Other EKG findings include: None.  LEG MOVEMENT DATA The total Periodic Limb Movements of Sleep (PLMS) were 0. The PLMS index was 0.00. A PLMS index of <15 is  considered normal in adults.  IMPRESSIONS - An optimal PAP pressure could not be obtained due to ongoing respiratory events.  - Severe Central Sleep Apnea was noted during this titration (CAI = 36.6/h). - Severe oxygen desaturations were observed during this titration (min O2 = 73.00%). - No snoring was audible during this study. - No cardiac abnormalities were observed during this study. - Clinically significant periodic limb movements were not noted during this study. Arousals associated with PLMs were rare.  DIAGNOSIS - Obstructive Sleep Apnea (327.23 [G47.33 ICD-10] - Central Sleep Apnea  RECOMMENDATIONS - As patient could not be adequately titrated on CPAP or BiPAP due to ongoing respiratory events, recommend auto BiPAP titration with IPAP max of 18cm H2O and EPAP min 5cm H2O and PS of 4cm H2O with a Medium size [employer] Full Face Mask Simplus mask and heated humidification. - Avoid alcohol, sedatives and other CNS depressants that may worsen sleep apnea and disrupt normal sleep architecture. - Sleep hygiene should be reviewed to assess factors that may improve sleep quality. - Weight management and regular exercise should be initiated or continued. - Return to Sleep Center for re-evaluation after 10 weeks of therapy  Armanda Magic Diplomate, American Board of Sleep Medicine  ELECTRONICALLY SIGNED ON:  06/06/2017, 11:32 PM [facility] PH: [phone]   FX: [phone] ACCREDITED BY THE AMERICAN ACADEMY OF SLEEP MEDICINE

## 2017-06-09 ENCOUNTER — Telehealth: Payer: Self-pay | Admitting: *Deleted

## 2017-06-09 NOTE — Progress Notes (Addendum)
Cardiology Office Note Date:  06/10/2017  Patient ID:  Spencer Robertson, Spencer Robertson [birth year], MRN [id number] PCP:  Patient, No Pcp Per  Cardiologist:  Dr. Graciela Husbands    Chief Complaint: ER visit f/u  History of Present Illness: Spencer Robertson is a 56 y.o. male with history of NICM, chronic CHF (systolic), LBBB w/CRT-D, initially had high DFT's that improved with the addition of Sotalol, OSA  , HTN, HLD, still smoking, Atrial tachycardia, morbid obesity.  He comes to the office today to be seen for Dr. Graciela Husbands.  He was last seen by him in August, at that time felt euvolemic and doing well with plans to follow up on his OSA, with consideration of changing ACE to entresto.  Most recently was seen by myself in March, at that time doing well, had not had sleep study done, stated noone called him to set this up.  We discussed perhaps changing his ACE to [facility] but he feelt like he was doing well with his current regime and wanted to make no changes.  Ac couple weeks ago he made an ER visit, 05/24/17 with c/o SOB, and waxing/waning edema, apparently ongoing for 6 months and prompted to get evaluated by his mother though no acute symptoms were reported.  He was given 40mg  IV lasix with reports of feeling better was planned for d/c from ER, his resting RA sat was 95%, with ambulation though 88% and offered admission though he declined feeling well.  CXR was notable for CHF with mild pulmonary interstitial edema more conspicuous than on the previous study. No alveolar pneumonia. BNP was 562, otherwise labs looked unremarkable with poc Trop 0.03   He reports after the IV lasix and taking the lasix BID at home initially felt like his swelling and breathing improved though in the last week or so his swelling has increased again, though his breathing remains "pretty good".  He is quite vague in regards to his breathing, denies any rest symptoms including nighttime and DOE waxes/wanes.  He denies any CP of any kind, no  palpitations, no dizziness, near syncope or syncope.  He mentions he has noticed that his left breast feels "different" then the right, particularly at his nipple, "harder then the other".  No pain, no fever, no nipple discharge, and inquires about getting it checked.     Device information: BSCi CRT-D, gen change 08/05/10, originally implanted 2005, Dr. Graciela Husbands AAD: sotalol added to improve DFT's   Past Medical History:  Diagnosis Date  . CHF (congestive heart failure) (HCC)   . Hyperlipidemia   . Hypertension   . Mitral regurgitation   . Nonischemic cardiomyopathy (HCC)   . Obesity   . Obesity (BMI 30-39.9) 09/01/2015  . OSA (obstructive sleep apnea)    severe  . S/P implantation of automatic cardioverter/defibrillator (AICD)    Guidant Contact H177  . Tobacco abuse     Past Surgical History:  Procedure Laterality Date  . CARDIAC DEFIBRILLATOR PLACEMENT     Guidant Contak H177 device     Current Outpatient Prescriptions  Medication Sig Dispense Refill  . aspirin 81 MG tablet Take 81 mg by mouth daily.      . carvedilol (COREG) 25 MG tablet take 1 tablet by mouth twice a day with meals (Patient taking differently: Take 25 mg by mouth two times a day) 180 tablet 3  . fosinopril (MONOPRIL) 20 MG tablet take 1 tablet by mouth once daily (Patient taking differently: Take 20 mg by mouth  once a day) 30 tablet 9  . furosemide (LASIX) 20 MG tablet Take 1 tablet (20 mg total) by mouth 2 (two) times daily. TAKE 40 MG IN THE AM AND 20 MG IN THE PM 180 tablet 1  . Magnesium Oxide 400 MG CAPS Take 400 mg two times daily (Patient taking differently: Take 400 mg by mouth 2 (two) times daily. ) 60 capsule 6  . mometasone (NASONEX) 50 MCG/ACT nasal spray 2 sprays each nostril each AM 17 g 3  . sotalol (BETAPACE) 80 MG tablet take 1 tablet by mouth twice a day (Patient taking differently: Take 80 mg by mouth two times a day) 180 tablet 3  . spironolactone (ALDACTONE) 50 MG tablet take 1/2 tablet  by mouth once daily (Patient taking differently: Take 25 mg by mouth once a day) 45 tablet 1   No current facility-administered medications for this visit.     Allergies:   Patient has no known allergies.   Social History:  The patient  reports that he has been smoking Cigarettes.  He has a 5.00 pack-year smoking history. He has never used smokeless tobacco. He reports that he drinks alcohol. He reports that he uses drugs, including Marijuana.   Family History:  The patient's family history includes Cardiomyopathy in his father; Heart disease in his brother and sister.  ROS:  Please see the history of present illness.  All other systems are reviewed and otherwise negative.   PHYSICAL EXAM:  VS:  BP 102/78   Pulse 62   Ht 5\' 11"  (1.803 m)   Wt 290 lb (131.5 kg)   BMI 40.45 kg/m  BMI: Body mass index is 40.45 kg/m.  Resting O2 sat on RA is 98%, after ambulating to the restroom and back 97% Well nourished, obese,  well developed, in no acute distress  HEENT: normocephalic, atraumatic  Neck: no JVD, carotid bruits or masses Cardiac:   RRR; no significant murmurs, no rubs, or gallops Lungs:  CTA b/l no wheezing, rhonchi or rales  Abd: soft, nontender, obese MS: no deformity or atrophy Ext: 1+ LE edema b/l  Skin: warm and dry, no rash Neuro:  No gross deficits appreciated Psych: euthymic mood, full affect  Chest: I do not feel any appreciable or obvious breast mass, perhaps more firm in general in comparison to the right no skin changes, dimpling, or obvious exam findings.  Non-tender b/l.   ICD site is stable, no tethering or discomfort.  There is no erythema, edema, no skin changes not painful to palpation,    EKG:  SR, BiVe paced, QRS is , QTc with RBBB/paced morphology is , stable in comparison to priors ICD interrogation done today by device RN, is reviewed by myself: stable lead and battery mesurements, 2 NSVT  12/11/02: TTE SUMMARY - The left ventricle was  markedly dilated. Overall left ventricular    systolic function was severely reduced. Left ventricular    ejection fraction was estimated , range being 10 % to 20 %.    There was severe diffuse left ventricular hypokinesis. There    was akinesis of the entire anteroseptal wall. There was    akinesis of the entire septal wall. There was akinesis of the    entire inferior wall. There was akinesis of the entire    periapical wall. Doppler parameters were consistent with    restrictive physiology, indicative of decreased left    ventricular diastolic compliance and/or increased left atrial    pressure. - There  was moderate to severe mitral valvular regurgitation. - The left atrium was markedly dilated. - The right ventricle was mildly dilated. - The right atrium was mildly to moderately dilated.  Recent Labs: 02/18/2017: Magnesium 1.6 05/24/2017: B Natriuretic Peptide 562.7; BUN 11; Creatinine, Ser 1.05; Hemoglobin 16.8; Platelets 129; Potassium 4.2; Sodium 137  No results found for requested labs within last 8760 hours.   Estimated Creatinine Clearance: 110 mL/min (by C-G formula based on SCr of 1.05 mg/dL).   Wt Readings from Last 3 Encounters:  06/10/17 290 lb (131.5 kg)  06/01/17 275 lb (124.7 kg)  04/07/17 275 lb (124.7 kg)     Other studies reviewed: Additional studies/records reviewed today include: summarized above  ASSESSMENT AND PLAN:  1. CRT-D     Stable device function, no changes made     Continue Q 57month remote monitoring  2. Chronic CHF,  DCM     on BB/ACE, lasix/aldactone     Re-discussed consideration of changing his ACE to [facility], he would like to hold off         He remains with edema, weight is up some, waxing/waning DOE, will further increase his lasix     BMET today given he has been taking more lasix since the ER, and repeat in 2-3 weeks pending today's result  3 HTN     stable, no changes  5. OSA by  history, unchecked in years, no CPAP at home     Snoring, daytime sleepiness, morbid obesity     He is in the process of CPAP titration scheduling  6. Smoking     Reported as infrequent, he has been counseled  7. He will be provided with a list of PMD and strongly urged (again) to get established and neesds f/u on his observations of l breast.      Should he have difficulty getting an appointment or require help with thie is instructed to let us know     He states understanding   Disposition: as above, will update his echo and see him back in 2 months, sooner if needed.  Current medicines are reviewed at length with the patient today.  The patient did not have any concerns regarding medicines.  Judith Blonder, PA-C 06/10/2017 1:02 PM     [facility] [address] [place] [address] [phone] (office)  [phone] (fax)

## 2017-06-09 NOTE — Telephone Encounter (Signed)
LMTCB

## 2017-06-09 NOTE — Telephone Encounter (Signed)
-----   Message from Quintella Reichert, MD sent at 06/06/2017 11:41 PM EDT ----- Pt had unsuccessful PAP titration. I have ordered a 2 week autotitration on BIPAP as patient is not a candidate for ASV BIPAP due to LV dysfunction and CHF.  Please set up OV with me in 4 weeks

## 2017-06-10 ENCOUNTER — Ambulatory Visit (INDEPENDENT_AMBULATORY_CARE_PROVIDER_SITE_OTHER): Payer: Medicaid Other | Admitting: Physician Assistant

## 2017-06-10 VITALS — BP 102/78 | HR 62 | Ht 71.0 in | Wt 290.0 lb

## 2017-06-10 DIAGNOSIS — I420 Dilated cardiomyopathy: Secondary | ICD-10-CM | POA: Diagnosis not present

## 2017-06-10 DIAGNOSIS — I10 Essential (primary) hypertension: Secondary | ICD-10-CM

## 2017-06-10 DIAGNOSIS — Z95810 Presence of automatic (implantable) cardiac defibrillator: Secondary | ICD-10-CM | POA: Diagnosis not present

## 2017-06-10 DIAGNOSIS — Z79899 Other long term (current) drug therapy: Secondary | ICD-10-CM

## 2017-06-10 DIAGNOSIS — I5022 Chronic systolic (congestive) heart failure: Secondary | ICD-10-CM

## 2017-06-10 DIAGNOSIS — I5023 Acute on chronic systolic (congestive) heart failure: Secondary | ICD-10-CM

## 2017-06-10 LAB — CUP PACEART INCLINIC DEVICE CHECK
Date Time Interrogation Session: 20180706040000
HighPow Impedance: 44 Ohm
HighPow Impedance: 62 Ohm
Implantable Lead Implant Date: 20051122
Implantable Lead Location: 753858
Implantable Lead Model: 158
Implantable Lead Serial Number: 159458
Lead Channel Impedance Value: 679 Ohm
Lead Channel Pacing Threshold Amplitude: 0.9 V
Lead Channel Pacing Threshold Amplitude: 1 V
Lead Channel Pacing Threshold Pulse Width: 0.4 ms
Lead Channel Pacing Threshold Pulse Width: 0.4 ms
Lead Channel Sensing Intrinsic Amplitude: 20.9 mV
Lead Channel Setting Pacing Amplitude: 2.4 V
Lead Channel Setting Sensing Sensitivity: 0.5 mV
Lead Channel Setting Sensing Sensitivity: 1 mV
MDC IDC LEAD IMPLANT DT: 20051122
MDC IDC LEAD IMPLANT DT: 20051122
MDC IDC LEAD LOCATION: 753859
MDC IDC LEAD LOCATION: 753860
MDC IDC MSMT LEADCHNL RA IMPEDANCE VALUE: 554 Ohm
MDC IDC MSMT LEADCHNL RA PACING THRESHOLD AMPLITUDE: 0.8 V
MDC IDC MSMT LEADCHNL RA PACING THRESHOLD PULSEWIDTH: 0.4 ms
MDC IDC MSMT LEADCHNL RA SENSING INTR AMPL: 6.1 mV
MDC IDC MSMT LEADCHNL RV IMPEDANCE VALUE: 492 Ohm
MDC IDC MSMT LEADCHNL RV SENSING INTR AMPL: 10.6 mV
MDC IDC PG IMPLANT DT: 20110831
MDC IDC SET LEADCHNL LV PACING AMPLITUDE: 2 V
MDC IDC SET LEADCHNL LV PACING PULSEWIDTH: 0.4 ms
MDC IDC SET LEADCHNL RA PACING AMPLITUDE: 2 V
MDC IDC SET LEADCHNL RV PACING PULSEWIDTH: 0.4 ms
Pulse Gen Serial Number: 480425

## 2017-06-10 LAB — BASIC METABOLIC PANEL
BUN/Creatinine Ratio: 18 (ref 9–20)
BUN: 18 mg/dL (ref 6–24)
CO2: 23 mmol/L (ref 20–29)
CREATININE: 1 mg/dL (ref 0.76–1.27)
Calcium: 9.7 mg/dL (ref 8.7–10.2)
Chloride: 98 mmol/L (ref 96–106)
GFR, EST AFRICAN AMERICAN: 98 mL/min/{1.73_m2} (ref >59)
GFR, EST NON AFRICAN AMERICAN: 84 mL/min/{1.73_m2} (ref >59)
Glucose: 101 mg/dL — ABNORMAL HIGH (ref 65–99)
POTASSIUM: 4.5 mmol/L (ref 3.5–5.2)
SODIUM: 139 mmol/L (ref 134–144)

## 2017-06-10 MED ORDER — FUROSEMIDE 20 MG PO TABS: 20.0000 mg | ORAL_TABLET | Freq: Two times a day (BID) | ORAL | 1 refills | Status: DC

## 2017-06-10 MED ORDER — FUROSEMIDE 20 MG PO TABS
20.0000 mg | ORAL_TABLET | Freq: Two times a day (BID) | ORAL | 1 refills | Status: DC
Start: 2017-06-10 — End: 2017-07-26

## 2017-06-10 NOTE — Patient Instructions (Addendum)
Medication Instructions:    START TAKING LASIX 40 MG IN THE AM AND 20 MG IN THE PM  If you need a refill on your cardiac medications before your next appointment, please call your pharmacy.  Labwork: BMET TODAY   RETURN FOR BMET IN 2 TO 3 WEEKS    Testing/Procedures: Your physician has requested that you have an echocardiogram. Echocardiography is a painless test that uses sound waves to create images of your heart. It provides your doctor with information about the size and shape of your heart and how well your heart's chambers and valves are working. This procedure takes approximately one hour. There are no restrictions for this procedure.   Follow-Up: IN  2 MONTHS WITH RENEE ( UNAVAILABLE APPOINTMENT  RECALL)    PROVIDE A LIST OF PCP  FOR PATIENT TO CALL FOR A PRIMARY CARE   Any Other Special Instructions Will Be Listed Below (If Applicable).  CUT BACK ON SALT INTAKE  AND WEIGHT YOUR SELF DAILY CONTACT OFFICE BACK IF ANY WEIGHT GAIN OF 3 LBS IN 24 HOURS OR 5 LBS IN  A WEEK

## 2017-06-10 NOTE — Telephone Encounter (Signed)
Patient in the office to see Francis Dowse, PA today and asked about his titration results. He understands he had an unsuccessful titration. He understands he will have a 2 week autotitration on BIPAP and will be called to arrange follow-up appointment. He was grateful for call.  Message sent to [facility] to arrange titration.

## 2017-06-13 NOTE — Telephone Encounter (Signed)
Patient called today about his titration results. Patient was reminded he got his results on Friday 7/6. Patient states he does not recall getting his results. Patient's results were reviewed and patient verbalized understanding. Patient understands his titration was unsuccessful. He understands Dr. Mayford Knife has ordered a 2 week autotitration on BIPAP. Patient understands he will be contacted by  [facility] for set up. Patient understands he will be called to schedule a OV in 4 weeks.

## 2017-06-21 ENCOUNTER — Telehealth: Payer: Self-pay | Admitting: *Deleted

## 2017-06-21 NOTE — Telephone Encounter (Signed)
LM WITH PT SISTER TO CALL BACK OFFICE FOR RESULTS AND LAB FOLLOW UP

## 2017-06-21 NOTE — Telephone Encounter (Signed)
-----   Message from Sheilah Pigeon, [place] sent at 06/10/2017  6:56 PM EDT ----- Please let the patient know his lab looked good.  Continue with his lasix as discussed at his visit, recheck BMET in 10-14 days please  Thanks renee

## 2017-06-22 ENCOUNTER — Telehealth: Payer: Self-pay | Admitting: Physician Assistant

## 2017-06-22 NOTE — Telephone Encounter (Signed)
Spoke to patient concerning his 2 week autotitration.  Patient understands he will be contacted by [facility] to set up his test. Patient understands his autotitration will be done at home. Patient understands he will receive a call in a week or so. Patient understands to call if he does not receive that call in a timely manner. Patient agrees with treatment and thanked me for call Patient understands he will called to schedule a OV in 4 weeks

## 2017-06-22 NOTE — Telephone Encounter (Signed)
F/U call:  Patient calling and states that he would like to speak with you in regards to a sleep study. Thanks.

## 2017-06-22 NOTE — Telephone Encounter (Signed)
PT CALLED AND RETURNED CALL BACK  BUT NO ANSWER LMOVM

## 2017-06-22 NOTE — Telephone Encounter (Signed)
Patient returning your call.  Thanks.

## 2017-06-24 ENCOUNTER — Other Ambulatory Visit: Payer: Self-pay | Admitting: Internal Medicine

## 2017-06-28 ENCOUNTER — Other Ambulatory Visit: Payer: Medicaid Other | Admitting: *Deleted

## 2017-06-28 ENCOUNTER — Other Ambulatory Visit (HOSPITAL_COMMUNITY): Payer: Medicaid Other

## 2017-06-28 DIAGNOSIS — Z79899 Other long term (current) drug therapy: Secondary | ICD-10-CM

## 2017-06-29 ENCOUNTER — Ambulatory Visit (HOSPITAL_COMMUNITY): Payer: Medicaid Other | Attending: Internal Medicine

## 2017-06-29 ENCOUNTER — Other Ambulatory Visit: Payer: Self-pay

## 2017-06-29 DIAGNOSIS — G4733 Obstructive sleep apnea (adult) (pediatric): Secondary | ICD-10-CM | POA: Diagnosis not present

## 2017-06-29 DIAGNOSIS — Z95810 Presence of automatic (implantable) cardiac defibrillator: Secondary | ICD-10-CM | POA: Diagnosis not present

## 2017-06-29 DIAGNOSIS — I110 Hypertensive heart disease with heart failure: Secondary | ICD-10-CM | POA: Insufficient documentation

## 2017-06-29 DIAGNOSIS — I5023 Acute on chronic systolic (congestive) heart failure: Secondary | ICD-10-CM | POA: Insufficient documentation

## 2017-06-29 DIAGNOSIS — F17210 Nicotine dependence, cigarettes, uncomplicated: Secondary | ICD-10-CM | POA: Insufficient documentation

## 2017-06-29 DIAGNOSIS — I071 Rheumatic tricuspid insufficiency: Secondary | ICD-10-CM | POA: Diagnosis not present

## 2017-06-29 LAB — BASIC METABOLIC PANEL
BUN/Creatinine Ratio: 13 (ref 9–20)
BUN: 13 mg/dL (ref 6–24)
CALCIUM: 9.4 mg/dL (ref 8.7–10.2)
CO2: 27 mmol/L (ref 20–29)
CREATININE: 1.03 mg/dL (ref 0.76–1.27)
Chloride: 95 mmol/L — ABNORMAL LOW (ref 96–106)
GFR, EST AFRICAN AMERICAN: 94 mL/min/{1.73_m2} (ref >59)
GFR, EST NON AFRICAN AMERICAN: 81 mL/min/{1.73_m2} (ref >59)
Glucose: 88 mg/dL (ref 65–99)
POTASSIUM: 4.2 mmol/L (ref 3.5–5.2)
Sodium: 140 mmol/L (ref 134–144)

## 2017-06-29 MED ORDER — PERFLUTREN LIPID MICROSPHERE
1.0000 mL | INTRAVENOUS | Status: AC | PRN
  Administered 2017-06-29: 2 mL via INTRAVENOUS

## 2017-06-30 ENCOUNTER — Telehealth: Payer: Self-pay | Admitting: *Deleted

## 2017-06-30 NOTE — Telephone Encounter (Signed)
-----   Message from Henrietta Dine, RN sent at 06/23/2017  1:20 PM EDT ----- Regarding: RE: DME order This was an order for a new CPAP. You need to see when this patient needs to follow-up for compliance to keep his machine ----- Message ----- From: Reesa Chew, CMA Sent: 06/22/2017   2:14 PM To: Henrietta Dine, RN Subject: FW: DME order                                  This is the appointment I asked you about. Thanks  ----- Message ----- From: Henrietta Dine, RN Sent: 06/10/2017  10:58 AM To: Sheron Nightingale, Reesa Chew, CMA Subject: DME order                                      DME order is in.  [facility]- please keep an eye out for an appointment.  Thanks!

## 2017-06-30 NOTE — Telephone Encounter (Addendum)
Patient understands he will be contacted by [facility] to set up his cpap. He understands to call if [facility] does not contact her with new setup in a timely manner. He understands he will be called once confirmation has been received from [facility] that he has received his new machine to schedule 10 week follow up appointment.  [facility] notified of new cpap order in epic Please add to [facility] He was grateful for the call and thanked me  Patient got set up on 07/15/17 Patients compliance range is 08/15/17-10/14/17. Patient has a 10 week sleep f/u appt. On Tuesday November 6 at 11:40.

## 2017-07-01 ENCOUNTER — Telehealth (HOSPITAL_COMMUNITY): Payer: Self-pay | Admitting: Vascular Surgery

## 2017-07-01 ENCOUNTER — Encounter (HOSPITAL_BASED_OUTPATIENT_CLINIC_OR_DEPARTMENT_OTHER): Payer: Medicaid Other

## 2017-07-01 NOTE — Telephone Encounter (Signed)
Left pt messge to make NEW PT APPT

## 2017-07-11 NOTE — Telephone Encounter (Signed)
Patient called today stating he never heard from [facility] concerning his BIPAP machine. Reached out ot [facility] ([email or web address]) and she states she could not find the order. The CPAP assistant found the order and faxed it to [facility] Referral team at [phone]. Patient notified and understands to call if he does not hear from [facility] in a timely manner.

## 2017-07-14 ENCOUNTER — Telehealth (HOSPITAL_COMMUNITY): Payer: Self-pay | Admitting: Vascular Surgery

## 2017-07-14 NOTE — Telephone Encounter (Signed)
Left message w/ pt family member to call back pt , this is our last attempt to reach pt

## 2017-07-20 ENCOUNTER — Encounter: Payer: Self-pay | Admitting: Physician Assistant

## 2017-07-22 ENCOUNTER — Encounter: Payer: Self-pay | Admitting: Physician Assistant

## 2017-07-26 ENCOUNTER — Ambulatory Visit (INDEPENDENT_AMBULATORY_CARE_PROVIDER_SITE_OTHER): Payer: Medicaid Other | Admitting: Physician Assistant

## 2017-07-26 ENCOUNTER — Encounter: Payer: Self-pay | Admitting: Physician Assistant

## 2017-07-26 VITALS — BP 112/76 | HR 65 | Ht 71.0 in | Wt 289.0 lb

## 2017-07-26 DIAGNOSIS — I420 Dilated cardiomyopathy: Secondary | ICD-10-CM | POA: Diagnosis not present

## 2017-07-26 DIAGNOSIS — I5022 Chronic systolic (congestive) heart failure: Secondary | ICD-10-CM

## 2017-07-26 DIAGNOSIS — Z95810 Presence of automatic (implantable) cardiac defibrillator: Secondary | ICD-10-CM | POA: Diagnosis not present

## 2017-07-26 DIAGNOSIS — Z79899 Other long term (current) drug therapy: Secondary | ICD-10-CM | POA: Diagnosis not present

## 2017-07-26 MED ORDER — POTASSIUM CHLORIDE CRYS ER 20 MEQ PO TBCR: 20.0000 meq | EXTENDED_RELEASE_TABLET | Freq: Every day | ORAL | 4 refills | Status: DC

## 2017-07-26 MED ORDER — FUROSEMIDE 40 MG PO TABS: 40.0000 mg | ORAL_TABLET | Freq: Two times a day (BID) | ORAL | 4 refills | Status: DC

## 2017-07-26 NOTE — Progress Notes (Signed)
Cardiology Office Note Date:  07/26/2017  Patient ID:  Spencer, Robertson [birth year], MRN [id number] PCP:  Patient, No Pcp Per  Cardiologist:  Dr. Graciela Husbands    Chief Complaint: ER visit f/u  History of Present Illness: Spencer Robertson is a 56 y.o. male with history of NICM, chronic CHF (systolic), LBBB w/CRT-D, initially had high DFT's that improved with the addition of Sotalol, OSA  , HTN, HLD, still smoking, Atrial tachycardia, morbid obesity.  He comes to the office today to be seen for Dr. Graciela Husbands.  He was last seen by him in August, at that time felt euvolemic and doing well with plans to follow up on his OSA, with consideration of changing ACE to entresto.   He had an ER visit, 05/24/17 with c/o SOB, and waxing/waning edema, apparently ongoing for 6 months and prompted to get evaluated by his mother though no acute symptoms were reported.  He was given 40mg  IV lasix with reports of feeling better was planned for d/c from ER, his resting RA sat was 95%, with ambulation though 88% and offered admission though he declined feeling well.  CXR was notable for CHF with mild pulmonary interstitial edema more conspicuous than on the previous study. No alveolar pneumonia. BNP was 562, otherwise labs looked unremarkable with poc Trop 0.03   I saw him in July, he reported after the IV lasix and taking the lasix BID at home initially felt like his swelling and breathing improved though in the last week or so his swelling has increased again, though his breathing remains "pretty good".  He was quite vague in regards to his breathing, denies any rest symptoms including nighttime and DOE waxes/wanes.  He denied any CP of any kind, no palpitations, no dizziness, near syncope or syncope.  He did mention he has noticed that his left breast feels "different" then the right, particularly at his nipple, "harder then the other".  No pain, no fever, no nipple discharge, and inquires about getting it checked.    His  lasix was adjusted and recommended to see his PMD regarding unilaterl nipple/breast observation.  He has been unagreeable to change to [facility]  He has not noted any significant change in his edema, perhaps less, his DOE continues to wax/wane, no rest SOB, denies symptoms of orthopnea or PND.  He feels like he gets bloated on/off as his more notable water retention.  No CP, palpitations, no dizziness, near syncope or syncope.  He has not gotten a PMD.  Device information: BSCi CRT-D, gen change 08/05/10, originally implanted 2005, Dr. Graciela Husbands AAD: sotalol added to improve DFT's   Past Medical History:  Diagnosis Date  . CHF (congestive heart failure) (HCC)   . Hyperlipidemia   . Hypertension   . Mitral regurgitation   . Nonischemic cardiomyopathy (HCC)   . Obesity   . Obesity (BMI 30-39.9) 09/01/2015  . OSA (obstructive sleep apnea)    severe  . S/P implantation of automatic cardioverter/defibrillator (AICD)    Guidant Contact H177  . Tobacco abuse     Past Surgical History:  Procedure Laterality Date  . CARDIAC DEFIBRILLATOR PLACEMENT     Guidant Contak H177 device     Current Outpatient Prescriptions  Medication Sig Dispense Refill  . aspirin 81 MG tablet Take 81 mg by mouth daily.      . carvedilol (COREG) 25 MG tablet take 1 tablet by mouth twice a day with meals (Patient taking differently: Take 25 mg by mouth  two times a day) 180 tablet 3  . fosinopril (MONOPRIL) 20 MG tablet take 1 tablet by mouth once daily (Patient taking differently: Take 20 mg by mouth once a day) 30 tablet 9  . furosemide (LASIX) 40 MG tablet Take 1 tablet (40 mg total) by mouth 2 (two) times daily. 60 tablet 4  . Magnesium Oxide 400 MG CAPS Take 400 mg two times daily (Patient taking differently: Take 400 mg by mouth 2 (two) times daily. ) 60 capsule 6  . mometasone (NASONEX) 50 MCG/ACT nasal spray 2 sprays each nostril each AM 17 g 3  . sotalol (BETAPACE) 80 MG tablet take 1 tablet by mouth twice a  day (Patient taking differently: Take 80 mg by mouth two times a day) 180 tablet 3  . potassium chloride SA (K-DUR,KLOR-CON) 20 MEQ tablet Take 1 tablet (20 mEq total) by mouth daily. 30 tablet 4   No current facility-administered medications for this visit.     Allergies:   Patient has no known allergies.   Social History:  The patient  reports that he has been smoking Cigarettes.  He has a 5.00 pack-year smoking history. He has never used smokeless tobacco. He reports that he drinks alcohol. He reports that he uses drugs, including Marijuana.   Family History:  The patient's family history includes Cardiomyopathy in his father; Heart disease in his brother and sister; Hypertension in his unknown relative.  ROS:  Please see the history of present illness.  All other systems are reviewed and otherwise negative.   PHYSICAL EXAM:  VS:  BP 112/76   Pulse 65   Ht 5\' 11"  (1.803 m)   Wt 289 lb (131.1 kg)   BMI 40.31 kg/m  BMI: Body mass index is 40.31 kg/m.   Well nourished, obese,  well developed, in no acute distress  HEENT: normocephalic, atraumatic  Neck: no JVD, carotid bruits or masses Cardiac: RRR; no significant murmurs, no rubs, or gallops Lungs:  CTA b/l no wheezing, rhonchi or rales  Abd: soft, nontender, obese MS: no deformity or atrophy Ext: 1+ LE edema b/l  Skin: warm and dry, no rash Neuro:  No gross deficits appreciated Psych: euthymic mood, full affect  Chest: I do not feel any appreciable or obvious breast mass, no skin changes, dimpling, or obvious exam findings.  Non-tender b/l.   ICD site is stable, no tethering or discomfort.  There is no erythema, edema, no skin changes not painful to palpation,    EKG:  06/10/17 SR, BiVe paced, QRS is , QTc with RBBB/paced morphology is , stable in comparison to priors ICD interrogation done today by device RN, is reviewed by myself: stable lead and battery mesurements, no observations  12/11/02: TTE SUMMARY -  The left ventricle was markedly dilated. Overall left ventricular    systolic function was severely reduced. Left ventricular    ejection fraction was estimated , range being 10 % to 20 %.    There was severe diffuse left ventricular hypokinesis. There    was akinesis of the entire anteroseptal wall. There was    akinesis of the entire septal wall. There was akinesis of the    entire inferior wall. There was akinesis of the entire    periapical wall. Doppler parameters were consistent with    restrictive physiology, indicative of decreased left    ventricular diastolic compliance and/or increased left atrial    pressure. - There was moderate to severe mitral valvular regurgitation. - The left  atrium was markedly dilated. - The right ventricle was mildly dilated. - The right atrium was mildly to moderately dilated.  Recent Labs: 02/18/2017: Magnesium 1.6 05/24/2017: B Natriuretic Peptide 562.7; Hemoglobin 16.8; Platelets 129 06/28/2017: BUN 13; Creatinine, Ser 1.03; Potassium 4.2; Sodium 140  No results found for requested labs within last 8760 hours.   CrCl cannot be calculated (Patient's most recent lab result is older than the maximum 21 days allowed.).   Wt Readings from Last 3 Encounters:  07/26/17 289 lb (131.1 kg)  06/10/17 290 lb (131.5 kg)  06/01/17 275 lb (124.7 kg)     Other studies reviewed: Additional studies/records reviewed today include: summarized above  ASSESSMENT AND PLAN:  1. CRT-D     Stable device function, no changes made     Continue Q 12month remote monitoring  2. Chronic CHF,  DCM     on BB/ACE, lasix/aldactone     We have discussed consideration of changing his ACE to [facility], he has not been agreeable         He remains with edema, weight only down a pound, waxing/waning DOE, will further increase his lasix to 40mg  BID     Question if his l breast/nipple tenderness is the spironolactone, stop this and  start Kdur daily     BMET in a week  I suspect dietary/sodium indiscretion, we discussed this  3 HTN     stable, no changes  5. OSA by history, unchecked in years, no CPAP at home     Snoring, daytime sleepiness, morbid obesity     He is in the process of CPAP titration scheduling  6. Smoking     Reported as infrequent, he has been counseled  7. He will be provided with a list of PMD and strongly urged (again) to get established and neesds f/u on his observations of l breast.      He has not gotten a PMD      Given flyer/information for [facility] and urged to get associated with a PMD  Disposition: 3 months RTC, sooner if needed  Current medicines are reviewed at length with the patient today.  The patient did not have any concerns regarding medicines.  Judith Blonder, PA-C 07/26/2017 3:11 PM     [facility] [address] [place] [address] [phone] (office)  [phone] (fax)

## 2017-07-26 NOTE — Patient Instructions (Addendum)
Medication Instructions:   STOP TAKING SPIROLACTONE  START LASIX 40 MG TWICE A DAY   START  K DUR 20 MEQ ONCE A DAY    If you need a refill on your cardiac medications before your next appointment, please call your pharmacy.  Labwork:  RETURN FOR BMET IN ONE WEEK  ON  ( 08-02-17 WROTE IN PEN ON FOR PT)   Testing/Procedures: NONE ORDERED  TODAY    Follow-Up:  3 MONTHS WITH URSUY  IN  (RECALL)   Any Other Special Instructions Will Be Listed Below (If Applicable).

## 2017-08-02 ENCOUNTER — Other Ambulatory Visit: Payer: Medicaid Other

## 2017-08-02 DIAGNOSIS — Z79899 Other long term (current) drug therapy: Secondary | ICD-10-CM

## 2017-08-02 LAB — BASIC METABOLIC PANEL
BUN / CREAT RATIO: 15 (ref 9–20)
BUN: 13 mg/dL (ref 6–24)
CALCIUM: 9.5 mg/dL (ref 8.7–10.2)
CHLORIDE: 95 mmol/L — AB (ref 96–106)
CO2: 27 mmol/L (ref 20–29)
CREATININE: 0.88 mg/dL (ref 0.76–1.27)
GFR calc Af Amer: 112 mL/min/{1.73_m2} (ref >59)
GFR calc non Af Amer: 97 mL/min/{1.73_m2} (ref >59)
GLUCOSE: 103 mg/dL — AB (ref 65–99)
Potassium: 4.3 mmol/L (ref 3.5–5.2)
Sodium: 138 mmol/L (ref 134–144)

## 2017-08-04 ENCOUNTER — Other Ambulatory Visit: Payer: Self-pay | Admitting: Internal Medicine

## 2017-08-04 ENCOUNTER — Encounter: Payer: Self-pay | Admitting: Cardiology

## 2017-08-09 ENCOUNTER — Telehealth: Payer: Self-pay | Admitting: *Deleted

## 2017-08-09 NOTE — Telephone Encounter (Signed)
LMOVM WITH FAMILY MEMBER FOR PT TO CALL OFFICE BACK FOR RESULTS

## 2017-08-10 ENCOUNTER — Telehealth: Payer: Self-pay | Admitting: Physician Assistant

## 2017-08-10 ENCOUNTER — Telehealth: Payer: Self-pay | Admitting: *Deleted

## 2017-08-10 NOTE — Telephone Encounter (Signed)
Follow Up:     Returning your call from yesterday,concerning hi lab results.

## 2017-08-10 NOTE — Telephone Encounter (Signed)
SPOKE TO PT ABOUT RESULTS 

## 2017-08-12 ENCOUNTER — Encounter: Payer: Medicaid Other | Admitting: Physician Assistant

## 2017-08-17 ENCOUNTER — Ambulatory Visit (HOSPITAL_COMMUNITY)
Admission: RE | Admit: 2017-08-17 | Discharge: 2017-08-17 | Disposition: A | Discharge status: Home / Self Care | Payer: Medicaid Other | Source: Ambulatory Visit | Attending: Cardiology | Admitting: Cardiology

## 2017-08-17 ENCOUNTER — Encounter (HOSPITAL_COMMUNITY): Payer: Self-pay | Admitting: Cardiology

## 2017-08-17 VITALS — BP 112/82 | HR 60 | Ht 71.0 in | Wt 281.0 lb

## 2017-08-17 DIAGNOSIS — G4733 Obstructive sleep apnea (adult) (pediatric): Secondary | ICD-10-CM | POA: Diagnosis not present

## 2017-08-17 DIAGNOSIS — Z7982 Long term (current) use of aspirin: Secondary | ICD-10-CM | POA: Diagnosis not present

## 2017-08-17 DIAGNOSIS — Z79899 Other long term (current) drug therapy: Secondary | ICD-10-CM | POA: Insufficient documentation

## 2017-08-17 DIAGNOSIS — Z8249 Family history of ischemic heart disease and other diseases of the circulatory system: Secondary | ICD-10-CM | POA: Insufficient documentation

## 2017-08-17 DIAGNOSIS — F17210 Nicotine dependence, cigarettes, uncomplicated: Secondary | ICD-10-CM | POA: Insufficient documentation

## 2017-08-17 DIAGNOSIS — I5022 Chronic systolic (congestive) heart failure: Secondary | ICD-10-CM | POA: Diagnosis not present

## 2017-08-17 DIAGNOSIS — I429 Cardiomyopathy, unspecified: Secondary | ICD-10-CM | POA: Insufficient documentation

## 2017-08-17 LAB — COMPREHENSIVE METABOLIC PANEL
ALK PHOS: 49 U/L (ref 38–126)
ALT: 23 U/L (ref 17–63)
ANION GAP: 7 (ref 5–15)
AST: 34 U/L (ref 15–41)
Albumin: 3.7 g/dL (ref 3.5–5.0)
BUN: 17 mg/dL (ref 6–20)
CALCIUM: 9.1 mg/dL (ref 8.9–10.3)
CO2: 26 mmol/L (ref 22–32)
Chloride: 100 mmol/L — ABNORMAL LOW (ref 101–111)
Creatinine, Ser: 1.17 mg/dL (ref 0.61–1.24)
Glucose, Bld: 104 mg/dL — ABNORMAL HIGH (ref 65–99)
Potassium: 4.1 mmol/L (ref 3.5–5.1)
SODIUM: 133 mmol/L — AB (ref 135–145)
Total Bilirubin: 1.5 mg/dL — ABNORMAL HIGH (ref 0.3–1.2)
Total Protein: 7.7 g/dL (ref 6.5–8.1)

## 2017-08-17 LAB — BRAIN NATRIURETIC PEPTIDE: B NATRIURETIC PEPTIDE 5: 113.7 pg/mL — AB (ref 0.0–100.0)

## 2017-08-17 MED ORDER — SACUBITRIL-VALSARTAN 24-26 MG PO TABS: 1.0000 | ORAL_TABLET | Freq: Two times a day (BID) | ORAL | 6 refills | Status: DC

## 2017-08-17 NOTE — Patient Instructions (Signed)
Routine lab work today. Will notify you of abnormal results, otherwise no news is good news!  STOP Lisinopril.  START Entresto 24/26 mg tablet twice daily starting on Friday 08/19/2017.  Follow up 2 weeks with Elizabeth Palau CHF clinical pharmacist with labs.  _______________________________________________________________  _______________________________________________________________  Follow up 6 weeks with Dr. Shirlee Latch.  _______________________________________________________________  _______________________________________________________________  Take all medication as prescribed the day of your appointment. Bring all medications with you to your appointment.  Do the following things EVERYDAY: 1) Weigh yourself in the morning before breakfast. Write it down and keep it in a log. 2) Take your medicines as prescribed 3) Eat low salt foods-Limit salt (sodium) to 2000 mg per day.  4) Stay as active as you can everyday 5) Limit all fluids for the day to less than 2 liters

## 2017-08-18 ENCOUNTER — Encounter: Payer: Self-pay | Admitting: Cardiology

## 2017-08-18 NOTE — Progress Notes (Signed)
Cardiology: Dr. Graciela Husbands HF Cardiology: Dr. Shirlee Latch  56 yo with history of nonischemic cardiomyopathy was referred by Dr. Graciela Husbands for evaluation of CHF.    Patient has a long history of cardiomyopathy.  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  Most recent echo in 7/18 showed EF 20-25% with diffuse hypokinesis and dilated RV.  He has a strong family history of CHF (father, sister, half-brother).    Currently, he is short of breath after walking about 2 blocks or walking up stairs. This is fairly stable.  Rarely, he gets lightheaded if he stands up too fast.  No chest pain.  No orthopnea/PND.  No palpitations. He is in NSR.    ECG (personally reviewed): NSR, BiV-paced with QTc 538 msec.   Labs (8/18): K 4.3, creatinine 0.88  PMH: 1. OSA: Uses CPAP.  2. LBBB: s/p [employer] CRT-D.   3. 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  4. History of atrial tachycardia 5. Active smoker 6. Chronic systolic CHF: nonischemic cardiomyopathy.  Known since prior to 2005.  [employer] CRT-D.  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  - Echo (7/18): EF 20-25%, severe dilated LV, diffuse hypokinesis, severe RV dilation with low normal systolic function, moderate TR, PASP 52 mmHg.  - Gynecomastia with spironolactone.   SH: Smokes 3-4 cigs/day, rare ETOH, occasional marijuana.   Lives with friend in [place].   FH: Father with cardiomyopathy, 1/2 brother died from CHF, sister with CHF.   ROS: All systems reviewed and negative except as per HPI.   Current Outpatient Prescriptions  Medication Sig Dispense Refill  . aspirin 81 MG tablet Take 81 mg by mouth daily.      . carvedilol (COREG) 25 MG tablet take 1 tablet by mouth twice a day with meals (Patient taking differently: Take 25 mg by mouth two times a day) 180 tablet 3  . furosemide (LASIX) 40 MG tablet Take 1 tablet (40 mg total) by mouth 2 (two) times daily. 60 tablet 4  . Magnesium Oxide 400 MG CAPS Take 400 mg two times daily (Patient taking  differently: Take 400 mg by mouth 2 (two) times daily. ) 60 capsule 6  . mometasone (NASONEX) 50 MCG/ACT nasal spray 2 sprays each nostril each AM 17 g 3  . potassium chloride SA (K-DUR,KLOR-CON) 20 MEQ tablet Take 1 tablet (20 mEq total) by mouth daily. 30 tablet 4  . sotalol (BETAPACE) 80 MG tablet take 1 tablet by mouth twice a day (Patient taking differently: Take 80 mg by mouth two times a day) 180 tablet 3  . [START ON 08/19/2017] sacubitril-valsartan (ENTRESTO) 24-26 MG Take 1 tablet by mouth 2 (two) times daily. 60 tablet 6   No current facility-administered medications for this encounter.    BP 112/82 (BP Location: Right Arm, Patient Position: Sitting, Cuff Size: Normal)   Pulse 60   Ht  (1.803 m)   Wt 281 lb (127.5 kg)   SpO2 90%   BMI 39.19 kg/m  General: NAD, obese.  Neck: Thick, no JVD, no thyromegaly or thyroid nodule.  Lungs: Clear to auscultation bilaterally with normal respiratory effort. CV: Nondisplaced PMI.  Heart regular S1/S2, no S3/S4, no murmur.  No peripheral edema.  No carotid bruit.  Normal pedal pulses.  Abdomen: Soft, nontender, no hepatosplenomegaly, no distention.  Skin: Intact without lesions or rashes.  Neurologic: Alert and oriented x 3.  Psych: Normal affect. Extremities: No clubbing or cyanosis.  HEENT: Normal.   Assessment/Plan: 1. Chronic systolic  CHF: Nonischemic cardiomyopathy x years.  [employer] CRT-D device.  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  He is not volume overloaded on exam.  NYHA class II-III symptoms.  - Stop fosinopril, start Entresto 24/26 bid 36 hrs afterwards.  - Continue Coreg 25 mg bid.  - Continue current Lasix dosing, 40 mg bid.  - BMET/BNP today and again in 10 days after starting Entresto.  - Gynecomastia with spironolactone. Would try to start eplerenone at next appointment.  - Eventually, would also benefit from Bidil.  - Possible familial CMP, recommend that his twins who  are in their 67s get echoes done.  2. Smoking: Active smoker, I urged him to quit.  3. Sotalol use: For high DFTs.  QTc acceptable on ECG today.  4. OSA: Continue CPAP.   Followup in 3 wks with pharmacist for med titration and 6 wks with me.   Marca Ancona 08/18/2017

## 2017-08-18 NOTE — Addendum Note (Signed)
Encounter addended by: Laurey Morale, MD on: 08/18/2017 10:41 PM<BR>    Actions taken: LOS modified

## 2017-08-29 ENCOUNTER — Ambulatory Visit (HOSPITAL_COMMUNITY): Payer: Medicaid Other

## 2017-08-29 ENCOUNTER — Telehealth (HOSPITAL_COMMUNITY): Payer: Self-pay | Admitting: Pharmacist

## 2017-08-29 NOTE — Telephone Encounter (Signed)
Entresto PA approved by [place] Medicaid through 08/13/18.   Tyler Deis. Bonnye Fava, PharmD, BCPS, CPP Clinical Pharmacist Pager: [phone] Phone: [phone] 08/29/2017 9:33 AM

## 2017-08-30 ENCOUNTER — Telehealth: Payer: Self-pay | Admitting: *Deleted

## 2017-08-30 DIAGNOSIS — G4733 Obstructive sleep apnea (adult) (pediatric): Secondary | ICD-10-CM

## 2017-08-30 NOTE — Telephone Encounter (Signed)
-----   Message from Quintella Reichert, MD sent at 08/29/2017  3:01 PM EDT ----- Inadequate BiPAP titration - cannot use ASV due to CHF - please refer to Dr. Vassie Loll or [facility] for help in managing this difficult patient

## 2017-08-30 NOTE — Telephone Encounter (Signed)
Referral placed to [facility] for further recommendations. Patient has been notified of the referral. Patient was grateful for the call and thanked me.

## 2017-09-06 ENCOUNTER — Encounter: Payer: Self-pay | Admitting: Pulmonary Disease

## 2017-09-07 ENCOUNTER — Encounter: Payer: Self-pay | Admitting: Pulmonary Disease

## 2017-09-07 ENCOUNTER — Ambulatory Visit (INDEPENDENT_AMBULATORY_CARE_PROVIDER_SITE_OTHER): Payer: Medicaid Other | Admitting: Pulmonary Disease

## 2017-09-07 DIAGNOSIS — G4733 Obstructive sleep apnea (adult) (pediatric): Secondary | ICD-10-CM | POA: Diagnosis not present

## 2017-09-07 NOTE — Assessment & Plan Note (Signed)
His download shows that he has persistent obstructive events on BiPAP pressure of 18/8. He was supposed to be on auto BiPAP but appears to be an fixed BiPAP settings. Subjectively, he feels much improved  We will start by increasing EPAP to 12 cm and gradually if needed increase further to 14-16 cm keeping pressure support of 4 cm. In the past and known that CPAP of 18 cm seems to have been effective Unfortunately adaptive servo ventilation/ASV would be contraindicated with his LV dysfunction   Weight loss encouraged, compliance with goal of at least 4-6 hrs every night is the expectation. Advised against medications with sedative side effects Cautioned against driving when sleepy - understanding that sleepiness will vary on a day to day basis

## 2017-09-07 NOTE — Progress Notes (Signed)
Subjective:    Patient ID: Spencer Robertson, male    DOB: Apr 22, 1961, 56 y.o.   MRN: [id number]  HPI  56 year old obese man with nonischemic cardiomyopathy referred for evaluation of sleep-disordered breathing by cardiology.  He was diagnosed with severe OSA back in 2005 and was titrated to CPAP of 18 cm. He again underwent home sleep testing in 2011 which confirmed severe OSA with AHI of 59/hour. His last visit with [place] was in 2014 when he saw my partner Dr. Stann Mainland and was put back on CPAP machine. Somewhere in the interim he developed nonischemic cardiomyopathy status post AICD in 2005, his last echo 06/2017 shows EF of 20% and he has follow-up with the CHF clinic ([place]] his earlier machine was broken and he underwent BiPAP titration study 05/2017 by cardiology. BiPAP was titrated as high as IPAP of 18 and central apneas emerged during the study. He was then started on auto BiPAP initially with a pressure of 18/5, severe obstructive apneas were seen to be present on the download report. BiPAP was then changed to 18/8 and I reviewed the download today including the prior downloads, this shows residual AHI 55/over mainly obstructive events. His compliance is good on days used of about 6 hours however he is several missed nights. He is a mild leak on his full face mask.  Due to presence of central apneas, he was referred to [place] by the cardiologist/sleep physician.  He reports remarkable improvement with his current machine. Subjectively he states that his sleepiness has improved and he has more energy the daytime. Epworth sleepiness score is 10. Bedtime as around 11 PM, he sleeps on his right side with 3 pillows, sleep latency is variable about 1-2 hours, reports one to 2 nocturnal awakenings including nocturia since he is on diuretics, denies orthopnea paroxysmal nocturnal dyspnea, he is out of bed at 7 AM feeling refreshed and denies dryness of mouth or headaches.  Review of her records show that he  has decreased 12 pounds in the last 4 years to his current weight of 278 pounds  He continues to smoke about 6 cigarettes per day  Significant tests/ events reviewed  NPSG 2005:  AHI 49/hr with desat to 56%.  Optimal pressure 18cm. HST 2011:  AHI 59/hr   Past Medical History:  Diagnosis Date  . CHF (congestive heart failure) (HCC)   . Hyperlipidemia   . Hypertension   . Mitral regurgitation   . Nonischemic cardiomyopathy (HCC)   . Obesity   . Obesity (BMI 30-39.9) 09/01/2015  . OSA (obstructive sleep apnea)    severe  . S/P implantation of automatic cardioverter/defibrillator (AICD)    Guidant Contact H177  . Tobacco abuse    Past Surgical History:  Procedure Laterality Date  . CARDIAC DEFIBRILLATOR PLACEMENT     Guidant Contak H177 device     No Known Allergies   Social History   Social History  . Marital status: Single    Spouse name: N/A  . Number of children: N/A  . Years of education: N/A   Occupational History  . disabled    Social History Main Topics  . Smoking status: Current Some Day Smoker    Packs/day: 0.25    Years: 20.00    Types: Cigarettes  . Smokeless tobacco: Never Used  . Alcohol use Yes     Comment: socially  . Drug use: Yes    Types: Marijuana     Comment: occasional  . Sexual activity: Not  on file   Other Topics Concern  . Not on file   Social History Narrative  . No narrative on file      Family History  Problem Relation Age of Onset  . Cardiomyopathy Father   . Hypertension Unknown   . Heart disease Sister   . Heart disease Brother      Review of Systems   Constitutional: negative for anorexia, fevers and sweats  Eyes: negative for irritation, redness and visual disturbance  Ears, nose, mouth, throat, and face: negative for earaches, epistaxis, nasal congestion and sore throat  Respiratory: negative for cough, dyspnea on exertion, sputum and wheezing  Cardiovascular: negative for chest pain, dyspnea, lower  extremity edema, orthopnea, palpitations and syncope  Gastrointestinal: negative for abdominal pain, constipation, diarrhea, melena, nausea and vomiting  Genitourinary:negative for dysuria, frequency and hematuria  Hematologic/lymphatic: negative for bleeding, easy bruising and lymphadenopathy  Musculoskeletal:negative for arthralgias, muscle weakness and stiff joints  Neurological: negative for coordination problems, gait problems, headaches and weakness  Endocrine: negative for diabetic symptoms including polydipsia, polyuria and weight loss     Objective:   Physical Exam   Gen. Pleasant, obese, in no distress, normal affect ENT - no lesions, no post nasal drip, class 2-3 airway Neck: No JVD, no thyromegaly, no carotid bruits Lungs: no use of accessory muscles, no dullness to percussion, decreased without rales or rhonchi  Cardiovascular: Rhythm regular, heart sounds  normal, no murmurs or gallops, no peripheral edema Abdomen: soft and non-tender, no hepatosplenomegaly, BS normal. Musculoskeletal: No deformities, no cyanosis or clubbing Neuro:  alert, non focal, no tremors        Assessment & Plan:

## 2017-09-07 NOTE — Patient Instructions (Signed)
Increase EPAP to 12 cm and recheck download in 2 weeks Prescription will be sent to DME to provide you supplies

## 2017-09-14 ENCOUNTER — Ambulatory Visit (HOSPITAL_COMMUNITY)
Admission: RE | Admit: 2017-09-14 | Discharge: 2017-09-14 | Disposition: A | Discharge status: Home / Self Care | Payer: Medicaid Other | Source: Ambulatory Visit | Attending: Cardiology | Admitting: Cardiology

## 2017-09-14 DIAGNOSIS — I5022 Chronic systolic (congestive) heart failure: Secondary | ICD-10-CM | POA: Insufficient documentation

## 2017-09-14 DIAGNOSIS — I428 Other cardiomyopathies: Secondary | ICD-10-CM | POA: Diagnosis not present

## 2017-09-14 DIAGNOSIS — F17210 Nicotine dependence, cigarettes, uncomplicated: Secondary | ICD-10-CM | POA: Diagnosis not present

## 2017-09-14 DIAGNOSIS — G4733 Obstructive sleep apnea (adult) (pediatric): Secondary | ICD-10-CM | POA: Insufficient documentation

## 2017-09-14 DIAGNOSIS — Z8249 Family history of ischemic heart disease and other diseases of the circulatory system: Secondary | ICD-10-CM | POA: Insufficient documentation

## 2017-09-14 MED ORDER — SACUBITRIL-VALSARTAN 24-26 MG PO TABS: 1.0000 | ORAL_TABLET | Freq: Two times a day (BID) | ORAL | 5 refills | Status: DC

## 2017-09-14 NOTE — Patient Instructions (Addendum)
It was great to see you today!  Please STOP FOSINOPRIL today.   Please START Entresto 24-26 mg TWICE DAILY starting on Friday 09/16/17.   Please keep your appointment with Dr. Shirlee Latch on 09/28/17.

## 2017-09-14 NOTE — Progress Notes (Signed)
HF MD: [facility]  HPI:  Spencer Robertson is a 56 yo African American M with history of nonischemic cardiomyopathy was referred by Dr. Graciela Husbands for evaluation of CHF.    Patient has a long history of cardiomyopathy.  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  Most recent echo in 7/18 showed EF 20-25% with diffuse hypokinesis and dilated RV.  He has a strong family history of CHF (father, sister, half-brother).    Presents today for pharmacist-led HF medication titration. At last HF clinic visit on 08/17/17, his fosinopril was switched to Entresto 24-26 mg BID. He states that he was not able to switch because his Sherryll Burger was not covered by his insurance. He feels well since last visit. Denies any CP, SOB, dizziness or lightheadedness. He reports great compliance with his regimen but is confused about some of his medications which he unfortunately did not bring to clinic today.     . Shortness of breath/dyspnea on exertion? no  . Orthopnea/PND? no . Edema? no . Lightheadedness/dizziness? no . Daily weights at home? yes . Blood pressure/heart rate monitoring at home? no . Following low-sodium/fluid-restricted diet? yes  HF Medications: Carvedilol 25 mg PO BID Furosemide 40 mg PO BID MagOx 400 mg PO daily KCl 20 mEq PO daily Fosinopril 20 mg PO daily   Precautions/Contraindications: Spironolactone - gynecomastia  Has the patient been experiencing any side effects to the medications prescribed?  no  Does the patient have any problems obtaining medications due to transportation or finances?   No - [place] Medicaid  Understanding of regimen: fair Understanding of indications: fair Potential of compliance: good Patient understands to avoid NSAIDs. Patient understands to avoid decongestants.    Pertinent Lab Values: . 08/17/17: Serum creatinine 1.17, BUN 17, Potassium 4.1, Sodium 133, BNP 113  Vital Signs: . Weight: 278.2 lb (last HF clinic visit weight: 278 lb) . Blood pressure: 102/70 mmHg   . Heart rate: 62 bpm    Assessment: 1. Chronicsystolic CHF (EF 86-76% on 06/29/17), due to ?familial CM. NYHA class IIsymptoms.  - Volume status stable  - Verified that Sherryll Burger was approved by his Medicaid, so will again switch from fosinopril to Entresto 24-26 mg BID - also given 30 day free card  - Continue carvedilol 25 mg BID, furosemide 40 mg BID, MagOx 400 mg daily, KCl 20 mEq daily - Basic disease state pathophysiology, medication indication, mechanism and side effects reviewed at length with patient and he verbalized understanding 2. Smoking: Active smoker, encouraged him to quit.  3. Sotalol use: For high DFTs.  QTc acceptable on recent ECG.  4. OSA: Continue CPAP.   Plan: 1) Medication changes: Based on clinical presentation, vital signs and recent labs will stop fosinopril and start Entresto 24-26 mg BID on Friday (10/12) am 2) Labs: BMET at next visit 3) Follow-up: Dr. Shirlee Latch on 09/28/17   Spencer Robertson, PharmD, BCPS, CPP Clinical Pharmacist Pager: [phone] Phone: [phone] 09/14/2017 1:12 PM

## 2017-09-23 ENCOUNTER — Other Ambulatory Visit: Payer: Self-pay | Admitting: Internal Medicine

## 2017-09-26 ENCOUNTER — Telehealth: Payer: Self-pay | Admitting: Cardiology

## 2017-09-26 NOTE — Telephone Encounter (Signed)
Error

## 2017-09-28 ENCOUNTER — Encounter (HOSPITAL_COMMUNITY): Payer: Self-pay | Admitting: Cardiology

## 2017-09-28 ENCOUNTER — Ambulatory Visit (HOSPITAL_COMMUNITY)
Admission: RE | Admit: 2017-09-28 | Discharge: 2017-09-28 | Disposition: A | Discharge status: Home / Self Care | Payer: Medicaid Other | Source: Ambulatory Visit | Attending: Cardiology | Admitting: Cardiology

## 2017-09-28 VITALS — BP 106/61 | HR 63 | Wt 286.4 lb

## 2017-09-28 DIAGNOSIS — Z8249 Family history of ischemic heart disease and other diseases of the circulatory system: Secondary | ICD-10-CM | POA: Diagnosis not present

## 2017-09-28 DIAGNOSIS — Z7982 Long term (current) use of aspirin: Secondary | ICD-10-CM | POA: Diagnosis not present

## 2017-09-28 DIAGNOSIS — N62 Hypertrophy of breast: Secondary | ICD-10-CM | POA: Diagnosis not present

## 2017-09-28 DIAGNOSIS — I5022 Chronic systolic (congestive) heart failure: Secondary | ICD-10-CM

## 2017-09-28 DIAGNOSIS — I471 Supraventricular tachycardia: Secondary | ICD-10-CM | POA: Insufficient documentation

## 2017-09-28 DIAGNOSIS — G4733 Obstructive sleep apnea (adult) (pediatric): Secondary | ICD-10-CM | POA: Insufficient documentation

## 2017-09-28 DIAGNOSIS — I429 Cardiomyopathy, unspecified: Secondary | ICD-10-CM | POA: Insufficient documentation

## 2017-09-28 DIAGNOSIS — Z79899 Other long term (current) drug therapy: Secondary | ICD-10-CM | POA: Insufficient documentation

## 2017-09-28 LAB — BASIC METABOLIC PANEL
Anion gap: 8 (ref 5–15)
BUN: 12 mg/dL (ref 6–20)
CHLORIDE: 103 mmol/L (ref 101–111)
CO2: 22 mmol/L (ref 22–32)
CREATININE: 0.97 mg/dL (ref 0.61–1.24)
Calcium: 8.9 mg/dL (ref 8.9–10.3)
GFR calc Af Amer: >60 mL/min (ref >60)
GFR calc non Af Amer: >60 mL/min (ref >60)
GLUCOSE: 96 mg/dL (ref 65–99)
POTASSIUM: 4 mmol/L (ref 3.5–5.1)
SODIUM: 133 mmol/L — AB (ref 135–145)

## 2017-09-28 MED ORDER — EPLERENONE 25 MG PO TABS: 25.0000 mg | ORAL_TABLET | Freq: Every day | ORAL | 1 refills | Status: DC

## 2017-09-28 NOTE — Progress Notes (Signed)
Advanced Heart Failure Medication Review by a Pharmacist  Does the patient  feel that his/her medications are working for him/her?  yes  Has the patient been experiencing any side effects to the medications prescribed?  no  Does the patient measure his/her own blood pressure or blood glucose at home?  no   Does the patient have any problems obtaining medications due to transportation or finances?   No - [name] Medicaid  Understanding of regimen: good Understanding of indications: good Potential of compliance: good Patient understands to avoid NSAIDs. Patient understands to avoid decongestants.  Issues to address at subsequent visits: None   Pharmacist comments: Spencer Robertson is a pleasant 56 yo M presenting without a medication list but with good recall of his regimen. He reports good compliance with his regimen and did not have any specific medication-related questions or concerns for me at this time.   Tyler Deis. Bonnye Fava, PharmD, BCPS, CPP Clinical Pharmacist Pager: [phone] Phone: [phone] 09/28/2017 2:20 PM      Time with patient: 10 minutes Preparation and documentation time: 2 minutes Total time: 12 minutes

## 2017-09-28 NOTE — Progress Notes (Signed)
Cardiology: Dr. Graciela HusbandsKlein HF Cardiology: Dr. Shirlee LatchMcLean  56 yo with history of nonischemic cardiomyopathy with a long history of cardiomyopathy.  He got a Environmental manager[employer] Scientific CRT-D device in 2005.  Most recent echo in 7/18 showed EF 20-25% with diffuse hypokinesis and dilated RV.  He has a strong family history of CHF (father, sister, half-brother).    He retuns for HF follow up. Last visit Sherryll Burgerntresto was started. Overall feeling fine. Denies SOB/PND/Orthopnea. Appetite ok. No fever or chills. No ICD shocks. Weight at home 286 pounds.  Smoking 6 cigarettes per day. Uses CPAP nightly. Taking all medications  ECG (personally reviewed): a-paced, BiV-paced with QTc 538 msec.   Labs (8/18): K 4.3, creatinine 0.88 Labs (08/17/2017) K 4.1 Creatinine 1.17, BNP 114  PMH: 1. OSA: Uses CPAP.  2. LBBB: s/p [employer] CRT-D.   3. 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  4. History of atrial tachycardia 5. Active smoker 6. Chronic systolic CHF: nonischemic cardiomyopathy.  Known since prior to 2005.  [employer] CRT-D.  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  - Echo (7/18): EF 20-25%, severe dilated LV, diffuse hypokinesis, severe RV dilation with low normal systolic function, moderate TR, PASP 52 mmHg.  - Gynecomastia with spironolactone.   SH: Smokes 7 cigs/day, rare ETOH, occasional marijuana.   Lives with friend in [place].   FH: Father with cardiomyopathy, 1/2 brother died from CHF, sister with CHF.   ROS: All systems reviewed and negative except as per HPI.   Current Outpatient Prescriptions  Medication Sig Dispense Refill  . aspirin 81 MG tablet Take 81 mg by mouth daily.      . carvedilol (COREG) 25 MG tablet Take 25 mg by mouth 2 (two) times daily with a meal.    . furosemide (LASIX) 40 MG tablet Take 1 tablet (40 mg total) by mouth 2 (two) times daily. 60 tablet 4  . magnesium oxide (MAG-OX) 400 MG tablet Take 400 mg by mouth daily.    . mometasone (NASONEX) 50 MCG/ACT nasal spray 2 sprays each  nostril each AM 17 g 3  . potassium chloride SA (K-DUR,KLOR-CON) 20 MEQ tablet Take 1 tablet (20 mEq total) by mouth daily. 30 tablet 4  . sacubitril-valsartan (ENTRESTO) 24-26 MG Take 1 tablet by mouth 2 (two) times daily. 60 tablet 5  . sotalol (BETAPACE) 80 MG tablet Take 80 mg by mouth 2 (two) times daily.     No current facility-administered medications for this encounter.    BP 106/61   Pulse 63   Wt 286 lb 6.4 oz (129.9 kg)   SpO2 96%   BMI 39.94 kg/m  General: NAD, obese.  Neck: Thick, no JVD, no thyromegaly or thyroid nodule.  Lungs: Clear to auscultation bilaterally with normal respiratory effort. CV: Nondisplaced PMI.  Heart regular S1/S2, no S3/S4, no murmur.  No peripheral edema.  No carotid bruit.  Normal pedal pulses.  Abdomen: Soft, nontender, no hepatosplenomegaly, no distention.  Skin: Intact without lesions or rashes.  Neurologic: Alert and oriented x 3.  Psych: Normal affect. Extremities: No clubbing or cyanosis.  HEENT: Normal.   Assessment/Plan: 1. Chronic systolic CHF: Nonischemic cardiomyopathy x years.  [employer] CRT-D device.  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  ECHO EF 06/2017 EF 20-25%.  He is not volume overloaded on exam.   NYHA II. Volume status stable. Contine lasix 40 mg twice a day.  - Continue entresto 24/26 mg twice a day.  - Continue Coreg 25 mg  bid.  - Intolerant spiro due to gynecomastia. Add 25 mg Inspra daily.   - Eventually, would also benefit from Bidil.  - Possible familial CMP, recommend that his twins who are in their 61s get echoes done. - Taking sotalol b/c of high DFTs.   2. Smoking: Discussed smoking cessation.   3. Atrial Tachycardia: He is on sotalol 4. OSA: Continue CPAP.   Follow up in 6 weeks.   Amy Clegg 09/28/2017   Patient seen with NP, agree with the above note.  I made adjustments to the above note to reflect my thoughts.    Exam showed thick neck, no JVD, no edema.  Clear  lungs and normal heart sounds.   Patient is stable symptomatically stable, NYHA class II. Not volume overloaded.  - Add eplerenone 25 mg daily (gynecomastia with spironolactone).   - BMET today and again in 10 days.    Paced QTc interval acceptable on sotalol.   I will refer him for cardiac rehab.   He will followup with pharmacist in 1 month and me in 2 months.   Marca Ancona 09/28/2017

## 2017-09-28 NOTE — Patient Instructions (Signed)
Start Inspra 25 mg (1 tab) daily  Labs drawn today (if we do not call you, then your lab work was stable)   Your physician recommends that you return for lab work in: 10 days  Your physician recommends that you schedule a follow-up appointment in: [place]

## 2017-10-05 ENCOUNTER — Encounter: Payer: Self-pay | Admitting: Cardiology

## 2017-10-10 ENCOUNTER — Ambulatory Visit: Payer: Medicaid Other | Admitting: Adult Health

## 2017-10-10 ENCOUNTER — Other Ambulatory Visit (HOSPITAL_COMMUNITY): Payer: Medicaid Other

## 2017-10-11 ENCOUNTER — Ambulatory Visit (INDEPENDENT_AMBULATORY_CARE_PROVIDER_SITE_OTHER): Payer: Medicaid Other | Admitting: Cardiology

## 2017-10-11 ENCOUNTER — Encounter: Payer: Self-pay | Admitting: Cardiology

## 2017-10-11 VITALS — BP 100/52 | HR 62 | Ht 71.0 in | Wt 284.2 lb

## 2017-10-11 DIAGNOSIS — G4733 Obstructive sleep apnea (adult) (pediatric): Secondary | ICD-10-CM

## 2017-10-11 DIAGNOSIS — E669 Obesity, unspecified: Secondary | ICD-10-CM

## 2017-10-11 DIAGNOSIS — I10 Essential (primary) hypertension: Secondary | ICD-10-CM | POA: Diagnosis not present

## 2017-10-11 NOTE — Patient Instructions (Signed)
Medication Instructions:  Your provider recommends that you continue on your current medications as directed. Please refer to the Current Medication list given to you today.    Labwork: None  Testing/Procedures: Dr. Mayford Knife recommends you have a BiPAP titration in the lab. You will be called to schedule this after it is approved by your insurance.  Follow-Up: Your provider recommends that you schedule a follow-up appointment AS NEEDED pending results.  Any Other Special Instructions Will Be Listed Below (If Applicable).     If you need a refill on your cardiac medications before your next appointment, please call your pharmacy.

## 2017-10-11 NOTE — Progress Notes (Signed)
Cardiology Office Note:    Date:  10/11/2017   ID:  [name], DOB 27-Aug-1961, MRN [id number]  PCP:  Patient, No Pcp Per  Cardiologist:  Armanda Magic, MD   Referring MD: No ref. provider found   Chief Complaint  Patient presents with  . Sleep Apnea  . Hypertension    History of Present Illness:    ALCARIO TINKEY is a 56 y.o. male with a hx of severe OSA who has been noncompliant with CPAP in the past but have not seen him in 2 years.  He was he was referred by Clementeen Hoof, PA-C for repeat sleep study as he was not using his CPAP.He was found to have severe obstructive sleep apnea with an AHI of 100.1.  He also had severe central sleep apnea with a central apnea index of 40.4/h.  He had nocturnal hypoxemia with oxygen saturations as low as 61%.  He underwent CPAP titration but cannot be adequately titrated due to ongoing respiratory events and was switched over to BiPAP.  He could not be adequately controlled on BiPAP therapy as well due to ongoing respiratory events and was placed on auto BiPAP titration at home.  He is now here for follow-up.  He is using a full facemask which he tolerates well.  He says he is really not had any problems with daytime sleepiness and is not really noticed significant changes in his sleep pattern or how he feels during the day since starting CPAP.  He is frustrated because he needs more supplies I did not know who to call.  He tries to sleep mainly on his side.  He denies any significant problems with mouth or nose dryness.  Past Medical History:  Diagnosis Date  . CHF (congestive heart failure) (HCC)   . Hyperlipidemia   . Hypertension   . Mitral regurgitation   . Nonischemic cardiomyopathy (HCC)   . Obesity   . Obesity (BMI 30-39.9) 09/01/2015  . OSA (obstructive sleep apnea)    severe  . S/P implantation of automatic cardioverter/defibrillator (AICD)    Guidant Contact H177  . Tobacco abuse     Past Surgical History:  Procedure Laterality  Date  . CARDIAC DEFIBRILLATOR PLACEMENT     Guidant Contak H177 device     Current Medications: Current Meds  Medication Sig  . aspirin 81 MG tablet Take 81 mg by mouth daily.    . carvedilol (COREG) 25 MG tablet Take 25 mg by mouth 2 (two) times daily with a meal.  . eplerenone (INSPRA) 25 MG tablet Take 1 tablet (25 mg total) by mouth daily.  . furosemide (LASIX) 40 MG tablet Take 1 tablet (40 mg total) by mouth 2 (two) times daily.  . magnesium oxide (MAG-OX) 400 MG tablet Take 400 mg by mouth daily.  . mometasone (NASONEX) 50 MCG/ACT nasal spray 2 sprays each nostril each AM  . potassium chloride SA (K-DUR,KLOR-CON) 20 MEQ tablet Take 1 tablet (20 mEq total) by mouth daily.  . sacubitril-valsartan (ENTRESTO) 24-26 MG Take 1 tablet by mouth 2 (two) times daily.  . sotalol (BETAPACE) 80 MG tablet Take 80 mg by mouth 2 (two) times daily.     Allergies:   Spironolactone   Social History   Socioeconomic History  . Marital status: Single    Spouse name: None  . Number of children: None  . Years of education: None  . Highest education level: None  Social Needs  . Financial resource strain: None  .  Food insecurity - worry: None  . Food insecurity - inability: None  . Transportation needs - medical: None  . Transportation needs - non-medical: None  Occupational History  . Occupation: disabled  Tobacco Use  . Smoking status: Current Some Day Smoker    Packs/day: 0.25    Years: 20.00    Pack years: 5.00    Types: Cigarettes  . Smokeless tobacco: Never Used  Substance and Sexual Activity  . Alcohol use: Yes    Comment: socially  . Drug use: Yes    Types: Marijuana    Comment: occasional  . Sexual activity: None  Other Topics Concern  . None  Social History Narrative  . None     Family History: The patient's family history includes Cardiomyopathy in his father; Heart disease in his brother and sister; Hypertension in his unknown relative.  ROS:   Please see the  history of present illness.    ROS  All other systems reviewed and negative.   EKGs/Labs/Other Studies Reviewed:    The following studies were reviewed today: BiPAP download  EKG:  EKG is not ordered today.  Recent Labs: 02/18/2017: Magnesium 1.6 05/24/2017: Hemoglobin 16.8; Platelets 129 08/17/2017: ALT 23; B Natriuretic Peptide 113.7 09/28/2017: BUN 12; Creatinine, Ser 0.97; Potassium 4.0; Sodium 133   Recent Lipid Panel    Component Value Date/Time   CHOL  09/13/2007 0405    162        ATP III CLASSIFICATION:  <200     mg/dL   Desirable  [id number]  mg/dL   Borderline High  >=696>=240    mg/dL   High   TRIG 77 29/52/841310/07/2007 0405   HDL 46 09/13/2007 0405   CHOLHDL 3.5 09/13/2007 0405   VLDL 15 09/13/2007 0405   LDLCALC (H) 09/13/2007 0405    101        Total Cholesterol/HDL:CHD Risk Coronary Heart Disease Risk Table                     Men   Women  1/2 Average Risk   3.4   3.3    Physical Exam:    VS:  BP (!) 100/52   Pulse 62   Ht 5\' 11"  (1.803 m)   Wt 284 lb 3.2 oz (128.9 kg)   SpO2 94%   BMI 39.64 kg/m      Wt Readings from Last 3 Encounters:  10/11/17 284 lb 3.2 oz (128.9 kg)  09/28/17 286 lb 6.4 oz (129.9 kg)  09/07/17 278 lb (126.1 kg)     GEN:  Well nourished, well developed in no acute distress HEENT: Normal NECK: No JVD; No carotid bruits LYMPHATICS: No lymphadenopathy CARDIAC: RRR, no murmurs, rubs, gallops RESPIRATORY:  Clear to auscultation without rales, wheezing or rhonchi  ABDOMEN: Soft, non-tender, non-distended MUSCULOSKELETAL:  No edema; No deformity  SKIN: Warm and dry NEUROLOGIC:  Alert and oriented x 3 PSYCHIATRIC:  Normal affect   ASSESSMENT:    1. OSA (obstructive sleep apnea)   2. Benign essential HTN   3. Obesity (BMI 30-39.9)    PLAN:    In order of problems listed above:  1.  OSA - Patient has been using and benefiting from PAP use and will continue to benefit from therapy.  His download today showed 77% compliance and using  more than 4 hours nightly.  He still has a persistently elevated AHI at 44/h with only a central apnea index of 3.6/h.  There is no  significant mask leak.  I have recommended that we send him back to the sleep lab for titration using BiPAP S/T.  Hopefully we can improve his apnea index.  I will order him supplies today.  2.  Hypertension-blood pressure is very well controlled on exam today.  He will continue carvedilol 25 mg twice daily,Entresto 24-26 mg twice daily  3.  Obesity-I have encouraged him to get into a routine exercise program and cut back on carbs and portions.    Medication Adjustments/Labs and Tests Ordered: Current medicines are reviewed at length with the patient today.  Concerns regarding medicines are outlined above.  Orders Placed This Encounter  Procedures  . For home use only DME Bipap  . Bipap titration   No orders of the defined types were placed in this encounter.   Signed, Armanda Magic, MD  10/11/2017 12:25 PM    [facility]

## 2017-10-12 ENCOUNTER — Telehealth: Payer: Self-pay | Admitting: *Deleted

## 2017-10-12 NOTE — Telephone Encounter (Signed)
-----   Message from Henrietta Dine, RN sent at 10/11/2017 12:17 PM EST ----- Regarding: BiPAP titration/supplies order BiPAP titration ordered for precert  [place]- FYI for orders.  thanks

## 2017-10-17 ENCOUNTER — Telehealth (HOSPITAL_COMMUNITY): Payer: Self-pay

## 2017-10-17 NOTE — Telephone Encounter (Signed)
Re-faxed over medicaid Reimbursement form to Dr.McLeans Office. Called office and left message for triage nurse.

## 2017-10-18 ENCOUNTER — Encounter: Payer: Self-pay | Admitting: Internal Medicine

## 2017-10-19 ENCOUNTER — Other Ambulatory Visit: Payer: Self-pay | Admitting: *Deleted

## 2017-10-19 ENCOUNTER — Telehealth (HOSPITAL_COMMUNITY): Payer: Self-pay

## 2017-10-19 MED ORDER — CARVEDILOL 25 MG PO TABS: 25.0000 mg | ORAL_TABLET | Freq: Two times a day (BID) | ORAL | 2 refills | Status: DC

## 2017-10-19 NOTE — Telephone Encounter (Signed)
F/U call: Patient calling in reference to sleep studies. Patient was not very detailed but said he was waiting for a call back.

## 2017-10-19 NOTE — Telephone Encounter (Signed)
Reached out to the patient to see how I could help him and he asked about being scheduled for another sleep study.  CPAP assistant explained that sleep study referral has to be sent to the billing first unlike before when we scheduled the study first to be approved before we call the patient. Patient understands the CPAP assistant will call him once we hear back from the billing dept.that it is ok to schedule the sleep study.

## 2017-10-19 NOTE — Telephone Encounter (Signed)
Attempted to call patient in regards to Cardiac Rehab - Wife of patient answered and stated she would have patient return phone call.

## 2017-10-24 ENCOUNTER — Ambulatory Visit (HOSPITAL_COMMUNITY)
Admission: RE | Admit: 2017-10-24 | Discharge: 2017-10-24 | Disposition: A | Discharge status: Home / Self Care | Payer: Medicaid Other | Source: Ambulatory Visit | Attending: Cardiology | Admitting: Cardiology

## 2017-10-24 VITALS — BP 98/58 | HR 60 | Wt 280.8 lb

## 2017-10-24 DIAGNOSIS — G4733 Obstructive sleep apnea (adult) (pediatric): Secondary | ICD-10-CM | POA: Insufficient documentation

## 2017-10-24 DIAGNOSIS — Z8249 Family history of ischemic heart disease and other diseases of the circulatory system: Secondary | ICD-10-CM | POA: Diagnosis not present

## 2017-10-24 DIAGNOSIS — I429 Cardiomyopathy, unspecified: Secondary | ICD-10-CM | POA: Insufficient documentation

## 2017-10-24 DIAGNOSIS — I5022 Chronic systolic (congestive) heart failure: Secondary | ICD-10-CM | POA: Diagnosis present

## 2017-10-24 DIAGNOSIS — F17210 Nicotine dependence, cigarettes, uncomplicated: Secondary | ICD-10-CM | POA: Diagnosis not present

## 2017-10-24 LAB — BASIC METABOLIC PANEL
ANION GAP: 6 (ref 5–15)
BUN: 15 mg/dL (ref 6–20)
CALCIUM: 8.9 mg/dL (ref 8.9–10.3)
CHLORIDE: 100 mmol/L — AB (ref 101–111)
CO2: 27 mmol/L (ref 22–32)
Creatinine, Ser: 1.14 mg/dL (ref 0.61–1.24)
GFR calc Af Amer: >60 mL/min (ref >60)
GFR calc non Af Amer: >60 mL/min (ref >60)
GLUCOSE: 96 mg/dL (ref 65–99)
POTASSIUM: 3.9 mmol/L (ref 3.5–5.1)
Sodium: 133 mmol/L — ABNORMAL LOW (ref 135–145)

## 2017-10-24 LAB — MAGNESIUM: Magnesium: 1.5 mg/dL — ABNORMAL LOW (ref 1.7–2.4)

## 2017-10-24 MED ORDER — NICOTINE POLACRILEX 2 MG MT LOZG: 2.0000 mg | LOZENGE | OROMUCOSAL | 0 refills | Status: DC | PRN

## 2017-10-24 MED ORDER — EPLERENONE 25 MG PO TABS: 25.0000 mg | ORAL_TABLET | Freq: Every day | ORAL | 5 refills | Status: DC

## 2017-10-24 MED ORDER — NICOTINE POLACRILEX 2 MG MT LOZG
2.0000 mg | LOZENGE | OROMUCOSAL | 0 refills | Status: DC | PRN
Start: 2017-10-24 — End: 2017-10-24

## 2017-10-24 NOTE — Progress Notes (Signed)
HF MD: [facility]  HPI:  Spencer Robertson is a 56 yo African American M with history of nonischemic cardiomyopathy was referred by Dr. Graciela Husbands for evaluation of CHF.   Patient has a long history of cardiomyopathy. 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 Most recent echo in 7/18 showed EF 20-25% with diffuse hypokinesis and dilated RV. He has a strong family history of CHF (father, sister, half-brother).   Presents today with his medication bottles for pharmacist-led HF medication titration. At last HF clinic visit on 09/28/17, he was started on eplerenone 25 mg daily (gynecomastia with spironolactone). Patient was actually still taking spironolactone 25mg  QD instead of the eplerenone. He feels well since last visit. Denies any CP, SOB, dizziness or lightheadedness. He reports great compliance with his regimen. He did not have his magnesium bottle with him and does not recall taking this medication.    Shortness of breath/dyspnea on exertion? no   Orthopnea/PND? no  Edema? no  Lightheadedness/dizziness? no  Daily weights at home? no  Blood pressure/heart rate monitoring at home? no  Following low-sodium/fluid-restricted diet? yes  HF Medications: Carvedilol 25 mg PO BID Spironolactone 25 mg PO daily  Furosemide 40 mg PO BID KCl 20 mEq PO daily Entresto 24-26 mg PO BID  Precautions/Contraindications: Spironolactone - gynecomastia  Has the patient been experiencing any side effects to the medications prescribed?  no  Does the patient have any problems obtaining medications due to transportation or finances?   No - [place] Medicaid  Understanding of regimen: fair Understanding of indications: fair Potential of compliance: good Patient understands to avoid NSAIDs. Patient understands to avoid decongestants.   Pertinent Lab Values:  10/24/17: Serum creatinine 1.14 (BL ~1), BUN 15, Potassium 3.9, Sodium 133, Mg 1.5  08/17/17: BNP: 113.7  Vital Signs:  Weight: 280 lb  (last HF clinic visit weight: 278 lb) - does not weigh himself at home  Blood pressure: 95/88 mmHg   Heart rate: 60 bpm    Assessment: 1. 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 NYHA class IIsymptoms.  - Volume status stable  - Stop spironolactone 25 mg QD and start eplerenone 25 mg QD   - Continue Entresto 24-26mg  BID, carvedilol 25 mg BID, furosemide 40 mg BID, KCl 20 mEq daily  - Consider restarting Magnesium given low serum Mg today of 1.5   - Discussed adding BiDil 1/2 TID to his current regimen but given low blood pressure decided against additional medications at this time  - Basic disease state pathophysiology, medication indication, mechanism and side effects reviewed at length with patient and he verbalized understanding 2. Smoking:   - Active smoker, currently smokes 5-7 cigs/day down from 1.5 packs/day   - Encouraged him to quit and he agreed to try nicotine lozenges. He had tried the patches and gum in the past without success.  3. Sotalol use:   - For high DFTs. QTc acceptable on recent ECG.  4. OSA:   - Continue CPAP.   Plan: 1) Medication changes: Based on clinical presentation, vital signs and recent labs will switch spironolactone 25 mg QD to eplerenone 25 mg QD and consider adding Magnesium supplement. 2) Labs: BMET today 3) Follow-up: Dr. Shirlee Latch in 1 month   [place], PharmD Candidate   Tyler Deis. Bonnye Fava, PharmD, BCPS, CPP Clinical Pharmacist Pager: [phone] Phone: [phone] 09/14/2017 1:12 PM

## 2017-10-24 NOTE — Patient Instructions (Addendum)
It was great to see you today!  Please STOP spironolactone.   Please START eplerenone 25 mg ONCE DAILY.   Please TRY nicotine hard candy. Take 1 hard candy whenever you have the urge to smoke (no more than 20/day).   Labs today. We will call you with any changes.   You are scheduled for a follow up appointment with Dr. Shirlee Latch on Tuesday 12/13/17 at 3:20 PM.

## 2017-10-26 ENCOUNTER — Telehealth (HOSPITAL_COMMUNITY): Payer: Self-pay

## 2017-10-26 NOTE — Telephone Encounter (Signed)
Called and spoke with patient in regards to Cardiac Rehab - Patient is interested in the program. Scheduled orientation on 11/17/2017 at 7:45am. Patient will attend the 8:15am exc class.

## 2017-11-01 ENCOUNTER — Encounter: Payer: Self-pay | Admitting: *Deleted

## 2017-11-01 NOTE — Telephone Encounter (Signed)
FW: BiPAP titration/supplies order  Deidre Ala, CMA        No precert required. Please let me know when scheduled so I can enter documentation in appt notes. Thanks, Amy     ----- Message -----  From: Britt Bolognese  Sent: 10/11/2017 12:28 PM  To: Haywood Filler  Subject: FW: BiPAP titration/supplies order          ----- Message -----  From: Henrietta Dine, RN  Sent: 10/11/2017 12:17 PM  To: Reesa Chew, CMA, [facility] Sleep Studies  Subject: BiPAP titration/supplies order          BiPAP titration ordered for precert   Lollie Marrow for orders.   thanks

## 2017-11-01 NOTE — Telephone Encounter (Addendum)
Patient notified on cell [phone])of BiPAP Titration approved and scheduled.

## 2017-11-02 ENCOUNTER — Other Ambulatory Visit (HOSPITAL_COMMUNITY): Payer: Self-pay | Admitting: *Deleted

## 2017-11-02 MED ORDER — MAGNESIUM OXIDE 400 MG PO TABS: 400.0000 mg | ORAL_TABLET | Freq: Every day | ORAL | 3 refills | Status: DC

## 2017-11-08 ENCOUNTER — Telehealth (HOSPITAL_COMMUNITY): Payer: Self-pay | Admitting: Pharmacist

## 2017-11-15 ENCOUNTER — Telehealth (HOSPITAL_COMMUNITY): Payer: Self-pay | Admitting: *Deleted

## 2017-11-17 ENCOUNTER — Ambulatory Visit (HOSPITAL_COMMUNITY): Payer: Medicaid Other

## 2017-11-18 ENCOUNTER — Encounter: Payer: Medicaid Other | Admitting: Physician Assistant

## 2017-11-21 ENCOUNTER — Ambulatory Visit (HOSPITAL_COMMUNITY): Payer: Medicaid Other

## 2017-11-23 ENCOUNTER — Ambulatory Visit (HOSPITAL_COMMUNITY): Payer: Medicaid Other

## 2017-11-25 ENCOUNTER — Ambulatory Visit (HOSPITAL_COMMUNITY): Payer: Medicaid Other

## 2017-11-30 ENCOUNTER — Ambulatory Visit (HOSPITAL_COMMUNITY): Payer: Medicaid Other

## 2017-12-02 ENCOUNTER — Ambulatory Visit (HOSPITAL_COMMUNITY): Payer: Medicaid Other

## 2017-12-04 ENCOUNTER — Ambulatory Visit (HOSPITAL_BASED_OUTPATIENT_CLINIC_OR_DEPARTMENT_OTHER): Payer: Medicaid Other | Attending: Cardiology | Admitting: Cardiology

## 2017-12-04 VITALS — Ht 71.0 in | Wt 276.0 lb

## 2017-12-04 DIAGNOSIS — G4733 Obstructive sleep apnea (adult) (pediatric): Secondary | ICD-10-CM

## 2017-12-05 ENCOUNTER — Ambulatory Visit (HOSPITAL_COMMUNITY): Payer: Medicaid Other

## 2017-12-05 NOTE — Procedures (Signed)
   NAME: Spencer Robertson DATE OF BIRTH:  1961-10-27 MEDICAL RECORD NUMBER [id number]  LOCATION: [facility]  PHYSICIAN: Traci Turner  DATE OF STUDY: 12/04/2017  SLEEP STUDY TYPE: Positive Airway Pressure Titration               REFERRING PHYSICIAN: Quintella Reichert, MD  HEIGHT: 5\' 11"  (180.3 cm)  WEIGHT: 276 lb (125.2 kg)    Body mass index is 38.49 kg/m.  NECK SIZE: 20 in.   CLINICAL INFORMATION The patient is referred for a BiPAP titration to treat sleep apnea.  SLEEP STUDY TECHNIQUE As per the AASM Manual for the Scoring of Sleep and Associated Events v2.3 (April 2016) with a hypopnea requiring 4% desaturations.  The channels recorded and monitored were frontal, central and occipital EEG, electrooculogram (EOG), submentalis EMG (chin), nasal and oral airflow, thoracic and abdominal wall motion, anterior tibialis EMG, snore microphone, electrocardiogram, and pulse oximetry. Bilevel positive airway pressure (BPAP) was initiated at the beginning of the study and titrated to treat sleep-disordered breathing.  MEDICATIONS Medications self-administered by patient taken the night of the study : CARVEDILOL, SOTALOL  RESPIRATORY PARAMETERS Optimal IPAP Pressure (cm): N/A  AHI at Optimal Pressure (/hr) N/A Optimal EPAP Pressure (cm):N/A  Overall Minimal O2 (%):80.00 Minimal O2 at Optimal Pressure (%): N/A  SLEEP ARCHITECTURE Start Time:10:45:00 PM  Stop Time:4:46:57 AM  Total Time (min):362.0  Total Sleep Time (min):236.0 Sleep Latency (min):58.8  Sleep Efficiency (%):65.2  REM Latency (min):82.0  WASO (min): 67.2 Stage N1 (%): 8.69  Stage N2 (%): 73.09  Stage N3 (%): 0.00  Stage R (%):18.22 Supine (%):26.91  Arousal Index (/hr):4.1   CARDIAC DATA The 2 lead EKG demonstrated pacemaker generated. The mean heart rate was 59.44 beats per minute. Other EKG findings include: PVCs.  LEG MOVEMENT DATA The total Periodic Limb Movements of Sleep (PLMS) were  157. The PLMS index was 39.92. A PLMS index of <15 is considered normal in adults.  IMPRESSIONS - An optimal PAP pressure could not be selected for this patient due to ongoing respiratory events. - Severe Central Sleep Apnea was noted during this titration (CAI = 35.6/h). - Severe oxygen desaturations were observed during this titration (min O2 = 80.00%). - The patient snored with soft snoring volume. - No cardiac abnormalities were observed during this study. - Moderate periodic limb movements were observed during this study. Arousals associated with PLMs were rare.  DIAGNOSIS - Obstructive Sleep Apnea (327.23 [G47.33 ICD-10])  RECOMMENDATIONS - Repeat in lab BiPAP titration using S/T. - Avoid alcohol, sedatives and other CNS depressants that may worsen sleep apnea and disrupt normal sleep architecture. - Sleep hygiene should be reviewed to assess factors that may improve sleep quality. - Weight management and regular exercise should be initiated or continued.  Armanda Magic Diplomate, American Board of Sleep Medicine  ELECTRONICALLY SIGNED ON:  12/05/2017, 5:14 PM [facility] PH: [phone]   FX: [phone] ACCREDITED BY THE AMERICAN ACADEMY OF SLEEP MEDICINE

## 2017-12-07 ENCOUNTER — Ambulatory Visit (HOSPITAL_COMMUNITY): Payer: Medicaid Other

## 2017-12-08 ENCOUNTER — Telehealth: Payer: Self-pay | Admitting: *Deleted

## 2017-12-08 DIAGNOSIS — G4733 Obstructive sleep apnea (adult) (pediatric): Secondary | ICD-10-CM

## 2017-12-08 NOTE — Telephone Encounter (Signed)
-----   Message from Quintella Reichert, MD sent at 12/05/2017  5:31 PM EST ----- Please let patient know that he had unsuccessful BiPAP titration due to severity of sleep apnea.  He will need repeat PAP study using BiPAP S/T. Please set up titration in the sleep lab.

## 2017-12-08 NOTE — Telephone Encounter (Addendum)
Informed patient of sleep study results and patient understanding was verbalized. Patient understands his BiPAP titration was unsuccessful due to the severity of his sleep apnea. Patient understands he will need to repeat his BiPAP study using BiPAP S/T (spontaneous in time) this time. Patient understands his BiPAP Titration study is scheduled for Monday January 7. Patient understands his sleep study will be done at [facility] sleep lab. Patient understands he will receive a sleep packet in a week or so. Patient understands to call if he does not receive the sleep packet in a timely manner. Patient agrees with treatment and thanked me for call.

## 2017-12-09 ENCOUNTER — Ambulatory Visit (HOSPITAL_COMMUNITY): Payer: Medicaid Other

## 2017-12-12 ENCOUNTER — Ambulatory Visit (HOSPITAL_BASED_OUTPATIENT_CLINIC_OR_DEPARTMENT_OTHER): Payer: Medicaid Other | Attending: Cardiology | Admitting: Cardiology

## 2017-12-12 ENCOUNTER — Ambulatory Visit (HOSPITAL_COMMUNITY): Payer: Medicaid Other

## 2017-12-12 VITALS — Ht 71.0 in | Wt 276.0 lb

## 2017-12-12 DIAGNOSIS — G4733 Obstructive sleep apnea (adult) (pediatric): Secondary | ICD-10-CM | POA: Insufficient documentation

## 2017-12-12 DIAGNOSIS — Z79899 Other long term (current) drug therapy: Secondary | ICD-10-CM | POA: Diagnosis not present

## 2017-12-13 ENCOUNTER — Encounter (HOSPITAL_COMMUNITY): Payer: Medicaid Other | Admitting: Cardiology

## 2017-12-13 ENCOUNTER — Telehealth (HOSPITAL_COMMUNITY): Payer: Self-pay

## 2017-12-13 NOTE — Progress Notes (Deleted)
   NAME: Spencer Robertson DATE OF BIRTH:  1961/09/26 MEDICAL RECORD NUMBER [id number]  LOCATION: [facility]  PHYSICIAN: Traci Turner  DATE OF STUDY: 12/12/2017  SLEEP STUDY TYPE: Positive Airway Pressure Titration               REFERRING PHYSICIAN: Quintella Reichert, MD   HEIGHT: 5\' 11"  (180.3 cm)  WEIGHT: 276 lb (125.2 kg)    Body mass index is 38.49 kg/m.  NECK SIZE: 20 in.  CLINICAL INFORMATION The patient is referred for a BiPAP titration to treat sleep apnea.  SLEEP STUDY TECHNIQUE As per the AASM Manual for the Scoring of Sleep and Associated Events v2.3 (April 2016) with a hypopnea requiring 4% desaturations.  The channels recorded and monitored were frontal, central and occipital EEG, electrooculogram (EOG), submentalis EMG (chin), nasal and oral airflow, thoracic and abdominal wall motion, anterior tibialis EMG, snore microphone, electrocardiogram, and pulse oximetry. Bilevel positive airway pressure (BPAP) was initiated at the beginning of the study and titrated to treat sleep-disordered breathing.  MEDICATIONS Medications self-administered by patient taken the night of the study : CARVEDILOL, SOTALOL  RESPIRATORY PARAMETERS Optimal IPAP Pressure (cm): 23  AHI at Optimal Pressure (/hr) 0.0 Optimal EPAP Pressure (cm):19   Overall Minimal O2 (%):78.00  Minimal O2 at Optimal Pressure (%): 93.0 SLEEP ARCHITECTURE Start Time:9:59:03 PM  Stop Time:5:07:21 AM  Total Time (min):428.3  Total Sleep Time (min):353.3 Sleep Latency (min): 22.5  Sleep Efficiency (%): 82.5  REM Latency (min):145.5  WASO (min): 52.5 Stage N1 (%): 6.79  Stage N2 (%): 70.62  Stage N3 (%): 0.00  Stage R (%): 22.59 Supine (%):42.97  Arousal Index (/hr):29.2   CARDIAC DATA The 2 lead EKG demonstrated pacemaker generated. The mean heart rate was 61.93 beats per minute. Other EKG findings include: PVCs.  LEG MOVEMENT DATA The total Periodic Limb Movements of Sleep (PLMS)  were 0. The PLMS index was 0.00. A PLMS index of <15 is considered normal in adults.  IMPRESSIONS - Significant Central Sleep apnea persisted despite high BiPAP pressures.  - Moderate Central Sleep Apnea was noted during this titration (CAI = 28.7/h). - Severe oxygen desaturations were observed during this titration (min O2 = 78.00%). - No snoring was audible during this study. - 2-lead EKG demonstrated: PVCs - Clinically significant periodic limb movements were not noted during this study. Arousals associated with PLMs were rare.  DIAGNOSIS - Obstructive Sleep Apnea (327.23 [G47.33 ICD-10])  RECOMMENDATIONS - Inadequate BiPAP titration due to ongoing central events.  Recommend repeat BiPAP titration with S/T.   - Avoid alcohol, sedatives and other CNS depressants that may worsen sleep apnea and disrupt normal sleep architecture. - Sleep hygiene should be reviewed to assess factors that may improve sleep quality. - Weight management and regular exercise should be initiated or continued.  Armanda Magic Diplomate, American Board of Sleep Medicine  ELECTRONICALLY SIGNED ON:  12/13/2017, 9:14 AM [facility] PH: [phone]   FX: [phone] ACCREDITED BY THE AMERICAN ACADEMY OF SLEEP MEDICINE

## 2017-12-13 NOTE — Procedures (Addendum)
   NAME: Spencer Robertson DATE OF BIRTH:  12/26/1960 MEDICAL RECORD NUMBER [id number]  LOCATION: [facility]  PHYSICIAN: Maxon Kresse  DATE OF STUDY: 12/12/2017  SLEEP STUDY TYPE: Positive Airway Pressure Titration               REFERRING PHYSICIAN: Quintella Reichert, MD   HEIGHT: 5\' 11"  (180.3 cm)  WEIGHT: 276 lb (125.2 kg)    Body mass index is 38.49 kg/m.  NECK SIZE: 20 in.  CLINICAL INFORMATION The patient is referred for a BiPAP titration to treat sleep apnea.  SLEEP STUDY TECHNIQUE As per the AASM Manual for the Scoring of Sleep and Associated Events v2.3 (April 2016) with a hypopnea requiring 4% desaturations.  The channels recorded and monitored were frontal, central and occipital EEG, electrooculogram (EOG), submentalis EMG (chin), nasal and oral airflow, thoracic and abdominal wall motion, anterior tibialis EMG, snore microphone, electrocardiogram, and pulse oximetry. Bilevel positive airway pressure (BPAP) was initiated at the beginning of the study and titrated to treat sleep-disordered breathing.  MEDICATIONS Medications self-administered by patient taken the night of the study : CARVEDILOL, SOTALOL  RESPIRATORY PARAMETERS Optimal IPAP Pressure (cm): 23  AHI at Optimal Pressure (/hr) 0.0 Optimal EPAP Pressure (cm):19   Overall Minimal O2 (%):78.00  Minimal O2 at Optimal Pressure (%): 93.0 SLEEP ARCHITECTURE Start Time:9:59:03 PM  Stop Time:5:07:21 AM  Total Time (min):428.3  Total Sleep Time (min):353.3 Sleep Latency (min): 22.5  Sleep Efficiency (%): 82.5  REM Latency (min):145.5  WASO (min): 52.5 Stage N1 (%): 6.79  Stage N2 (%): 70.62  Stage N3 (%): 0.00  Stage R (%): 22.59 Supine (%):42.97  Arousal Index (/hr):29.2   CARDIAC DATA The 2 lead EKG demonstrated pacemaker generated. The mean heart rate was 61.93 beats per minute. Other EKG findings include: PVCs.  LEG MOVEMENT DATA The total Periodic Limb Movements of Sleep (PLMS) were  0. The PLMS index was 0.00. A PLMS index of <15 is considered normal in adults.  IMPRESSIONS - Significant Central Sleep apnea persisted despite high BiPAP pressures.  - Moderate Central Sleep Apnea was noted during this titration (CAI = 28.7/h). - Severe oxygen desaturations were observed during this titration (min O2 = 78.00%). - No snoring was audible during this study. - 2-lead EKG demonstrated: PVCs - Clinically significant periodic limb movements were not noted during this study. Arousals associated with PLMs were rare.  DIAGNOSIS - Obstructive Sleep Apnea (327.23 [G47.33 ICD-10])  RECOMMENDATIONS -  ResMed BiPAP S/T at 23/19cm H2O with backup rate of 18, heated humidity and mask of choice - Avoid alcohol, sedatives and other CNS depressants that may worsen sleep apnea and disrupt normal sleep architecture. - Sleep hygiene should be reviewed to assess factors that may improve sleep quality. - Weight management and regular exercise should be initiated or continued.  Armanda Magic Diplomate, American Board of Sleep Medicine  ELECTRONICALLY SIGNED ON:  12/13/2017, 9:14 AM [facility] PH: [phone]   FX: [phone] ACCREDITED BY THE AMERICAN ACADEMY OF SLEEP MEDICINE

## 2017-12-14 ENCOUNTER — Telehealth (HOSPITAL_COMMUNITY): Payer: Self-pay | Admitting: *Deleted

## 2017-12-14 ENCOUNTER — Ambulatory Visit (HOSPITAL_COMMUNITY): Payer: Medicaid Other

## 2017-12-14 NOTE — Telephone Encounter (Signed)
Received notification that pt would like to cancel his appt for 1/10 - cardiac rehab.  Pt not available, message left with pt mother. Contact information given for pt to call and reschedule. Alanson Aly, BSN Cardiac and Emergency planning/management officer

## 2017-12-14 NOTE — Telephone Encounter (Signed)
Patient wishes to reschedule cardiac rehab appointment on 12/14/2017. Referred him to cardiac rehab.   Adline Potter, PharmD Pharmacy Resident Pager: [phone]

## 2017-12-15 ENCOUNTER — Inpatient Hospital Stay (HOSPITAL_COMMUNITY): Admission: RE | Admit: 2017-12-15 | Payer: Medicaid Other | Source: Ambulatory Visit

## 2017-12-16 ENCOUNTER — Ambulatory Visit (HOSPITAL_COMMUNITY): Payer: Medicaid Other

## 2017-12-19 ENCOUNTER — Ambulatory Visit (HOSPITAL_COMMUNITY): Payer: Medicaid Other

## 2017-12-20 NOTE — Addendum Note (Signed)
Addended by: Armanda Magic R on: 12/20/2017 09:08 AM   Modules accepted: Orders

## 2017-12-21 ENCOUNTER — Ambulatory Visit (HOSPITAL_COMMUNITY): Payer: Medicaid Other

## 2017-12-22 ENCOUNTER — Telehealth: Payer: Self-pay | Admitting: Cardiology

## 2017-12-22 NOTE — Telephone Encounter (Signed)
New Message   Patient is calling about his prior conversation with Coralee North. He is wanting her to follow up with him about his sleep study. Please call.

## 2017-12-23 ENCOUNTER — Telehealth: Payer: Self-pay | Admitting: *Deleted

## 2017-12-23 ENCOUNTER — Ambulatory Visit (HOSPITAL_COMMUNITY): Payer: Medicaid Other

## 2017-12-23 NOTE — Telephone Encounter (Signed)
-----   Message from Quintella Reichert, MD sent at 12/20/2017  9:07 AM EST ----- Please let patient know that they had a successful PAP titration and let DME know that orders are in EPIC.  Please set up 10 week OV with me.

## 2017-12-26 ENCOUNTER — Ambulatory Visit (HOSPITAL_COMMUNITY): Payer: Medicaid Other

## 2017-12-28 ENCOUNTER — Ambulatory Visit (HOSPITAL_COMMUNITY): Payer: Medicaid Other

## 2017-12-30 ENCOUNTER — Ambulatory Visit (HOSPITAL_COMMUNITY): Payer: Medicaid Other

## 2018-01-02 ENCOUNTER — Ambulatory Visit (HOSPITAL_COMMUNITY): Payer: Medicaid Other

## 2018-01-04 ENCOUNTER — Ambulatory Visit (HOSPITAL_COMMUNITY): Payer: Medicaid Other

## 2018-01-06 ENCOUNTER — Ambulatory Visit (HOSPITAL_COMMUNITY): Payer: Medicaid Other

## 2018-01-09 ENCOUNTER — Other Ambulatory Visit: Payer: Self-pay | Admitting: Physician Assistant

## 2018-01-09 ENCOUNTER — Ambulatory Visit (HOSPITAL_COMMUNITY): Payer: Medicaid Other

## 2018-01-11 ENCOUNTER — Ambulatory Visit (HOSPITAL_COMMUNITY): Payer: Medicaid Other

## 2018-01-13 ENCOUNTER — Ambulatory Visit (HOSPITAL_COMMUNITY): Payer: Medicaid Other

## 2018-01-16 ENCOUNTER — Ambulatory Visit (HOSPITAL_COMMUNITY): Payer: Medicaid Other

## 2018-01-18 ENCOUNTER — Ambulatory Visit (HOSPITAL_COMMUNITY): Payer: Medicaid Other

## 2018-01-20 ENCOUNTER — Ambulatory Visit (HOSPITAL_COMMUNITY): Payer: Medicaid Other

## 2018-01-23 ENCOUNTER — Ambulatory Visit (HOSPITAL_COMMUNITY): Payer: Medicaid Other

## 2018-01-25 ENCOUNTER — Ambulatory Visit (HOSPITAL_COMMUNITY): Payer: Medicaid Other

## 2018-01-27 ENCOUNTER — Ambulatory Visit (HOSPITAL_COMMUNITY): Payer: Medicaid Other

## 2018-01-30 ENCOUNTER — Encounter (HOSPITAL_COMMUNITY): Payer: Self-pay | Admitting: Cardiology

## 2018-01-30 ENCOUNTER — Other Ambulatory Visit: Payer: Self-pay | Admitting: Physician Assistant

## 2018-01-30 ENCOUNTER — Ambulatory Visit (HOSPITAL_COMMUNITY): Payer: Medicaid Other

## 2018-01-30 ENCOUNTER — Ambulatory Visit (INDEPENDENT_AMBULATORY_CARE_PROVIDER_SITE_OTHER): Payer: Medicaid Other | Admitting: *Deleted

## 2018-01-30 ENCOUNTER — Ambulatory Visit (HOSPITAL_COMMUNITY)
Admission: RE | Admit: 2018-01-30 | Discharge: 2018-01-30 | Disposition: A | Discharge status: Home / Self Care | Payer: Medicaid Other | Source: Ambulatory Visit | Attending: Cardiology | Admitting: Cardiology

## 2018-01-30 VITALS — BP 118/78 | HR 63 | Wt 276.2 lb

## 2018-01-30 DIAGNOSIS — F17210 Nicotine dependence, cigarettes, uncomplicated: Secondary | ICD-10-CM | POA: Diagnosis not present

## 2018-01-30 DIAGNOSIS — Z79899 Other long term (current) drug therapy: Secondary | ICD-10-CM | POA: Insufficient documentation

## 2018-01-30 DIAGNOSIS — I420 Dilated cardiomyopathy: Secondary | ICD-10-CM | POA: Diagnosis not present

## 2018-01-30 DIAGNOSIS — I429 Cardiomyopathy, unspecified: Secondary | ICD-10-CM | POA: Diagnosis not present

## 2018-01-30 DIAGNOSIS — G4731 Primary central sleep apnea: Secondary | ICD-10-CM | POA: Insufficient documentation

## 2018-01-30 DIAGNOSIS — Z8249 Family history of ischemic heart disease and other diseases of the circulatory system: Secondary | ICD-10-CM | POA: Insufficient documentation

## 2018-01-30 DIAGNOSIS — I471 Supraventricular tachycardia: Secondary | ICD-10-CM | POA: Diagnosis not present

## 2018-01-30 DIAGNOSIS — Z7982 Long term (current) use of aspirin: Secondary | ICD-10-CM | POA: Diagnosis not present

## 2018-01-30 DIAGNOSIS — I5022 Chronic systolic (congestive) heart failure: Secondary | ICD-10-CM | POA: Insufficient documentation

## 2018-01-30 LAB — MAGNESIUM: MAGNESIUM: 1.7 mg/dL (ref 1.7–2.4)

## 2018-01-30 MED ORDER — SACUBITRIL-VALSARTAN 49-51 MG PO TABS: 1.0000 | ORAL_TABLET | Freq: Two times a day (BID) | ORAL | 3 refills | Status: DC

## 2018-01-30 NOTE — Patient Instructions (Signed)
Increase Entresto 49/51 mg (1 tab), twice a day  Labs drawn today (if we do not call you, then your lab work was stable)   Your physician recommends that you return for lab work in: 10 days  Your physician recommends that you schedule a follow-up appointment in: 1 month with Fara Chute D  Your physician recommends that you schedule a follow-up appointment in: 3 months with Dr. Shirlee Latch

## 2018-01-31 NOTE — Progress Notes (Signed)
Cardiology: Dr. Graciela Husbands HF Cardiology: Dr. Shirlee Latch  57 yo with history of nonischemic cardiomyopathy with a long history of cardiomyopathy.  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  Most recent echo in 7/18 showed EF 20-25% with diffuse hypokinesis and dilated RV.  He has a strong family history of CHF (father, sister, half-brother).  Sleep study in 12/18 with severe central sleep apnea, now on Bipap.   He retuns for HF followup. Weight is down 10 lbs. No dyspnea walking on flat ground or going up 1 flight steps.  No lightheadedness.  No chest pain.  He is still smoking, trying to quit.   ECG (personally reviewed): NSR, BiV-paced with QTc 539 msec.   Labs (8/18): K 4.3, creatinine 0.88 Labs (08/17/2017) K 4.1 Creatinine 1.17, BNP 114 Labs (11/18): K 3.9, creatinine 1.14  PMH: 1. Central sleep apnea: Uses Bipap.  2. LBBB: s/p [employer] CRT-D.   3. 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  4. History of atrial tachycardia 5. Active smoker 6. Chronic systolic CHF: nonischemic cardiomyopathy.  Known since prior to 2005.  [employer] CRT-D.  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  - Echo (7/18): EF 20-25%, severe dilated LV, diffuse hypokinesis, severe RV dilation with low normal systolic function, moderate TR, PASP 52 mmHg.  - Gynecomastia with spironolactone.   SH: Smokes 7 cigs/day, rare ETOH, occasional marijuana.   Lives with friend in [place].   FH: Father with cardiomyopathy, 1/2 brother died from CHF, sister with CHF.   ROS: All systems reviewed and negative except as per HPI.   Current Outpatient Medications  Medication Sig Dispense Refill  . aspirin 81 MG tablet Take 81 mg by mouth daily.      . carvedilol (COREG) 25 MG tablet Take 1 tablet (25 mg total) 2 (two) times daily with a meal by mouth. 180 tablet 2  . eplerenone (INSPRA) 25 MG tablet Take 1 tablet (25 mg total) daily by mouth. 30 tablet 5  . furosemide (LASIX) 40 MG tablet Take 1 tablet (40 mg total) by  mouth 2 (two) times daily. 60 tablet 4  . magnesium oxide (MAG-OX) 400 MG tablet Take 1 tablet (400 mg total) by mouth daily. 30 tablet 3  . mometasone (NASONEX) 50 MCG/ACT nasal spray 2 sprays each nostril each AM 17 g 3  . nicotine polacrilex (COMMIT) 2 MG lozenge Take 1 lozenge (2 mg total) as needed by mouth for smoking cessation. 100 tablet 0  . potassium chloride SA (K-DUR,KLOR-CON) 20 MEQ tablet take 1 tablet by mouth once daily 30 tablet 9  . sotalol (BETAPACE) 80 MG tablet Take 80 mg by mouth 2 (two) times daily.    . sacubitril-valsartan (ENTRESTO) 49-51 MG Take 1 tablet by mouth 2 (two) times daily. 60 tablet 3   No current facility-administered medications for this encounter.    BP 118/78   Pulse 63   Wt 276 lb 4 oz (125.3 kg)   SpO2 96%   BMI 38.53 kg/m   General: NAD Neck: No JVD, no thyromegaly or thyroid nodule.  Lungs: Clear to auscultation bilaterally with normal respiratory effort. CV: Nondisplaced PMI.  Heart regular S1/S2, no S3/S4, no murmur.  No peripheral edema.  No carotid bruit.  Normal pedal pulses.  Abdomen: Soft, nontender, no hepatosplenomegaly, no distention.  Skin: Intact without lesions or rashes.  Neurologic: Alert and oriented x 3.  Psych: Normal affect. Extremities: No clubbing or cyanosis.  HEENT: Normal.   Assessment/Plan: 1. Chronic systolic  CHF: Nonischemic cardiomyopathy x years.  [employer] CRT-D device.  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  ECHO EF 06/2017 EF 20-25%. He is not volume overloaded on exam.  NYHA II.  - Continue lasix 40 mg daily.  - Increase Entresto to 49/51 bid. BMET today, repeat BMET 10 days.  - Continue Coreg 25 mg bid.  - Intolerant spiro due to gynecomastia. Continue eplerenone 25 mg daily.   - I will have him followup with pharmacy clinic in 1 month, if BP stable, would add Bidil.  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 - Taking  sotalol b/c of high DFTs.  Paced QTc acceptable on ECG.  2. Smoking: Discussed smoking cessation.  He is trying to quit, has nicotine patches.  3. Atrial Tachycardia: He is on sotalol 4. Central sleep apnea: Continue Bipap.    Followup in pharmacy clinic in 1 month for med titration and me in 3 months.   Spencer Robertson 01/31/2018

## 2018-01-31 NOTE — Progress Notes (Signed)
Remote ICD transmission.   

## 2018-02-01 ENCOUNTER — Telehealth (HOSPITAL_COMMUNITY): Payer: Self-pay

## 2018-02-01 ENCOUNTER — Ambulatory Visit (HOSPITAL_COMMUNITY): Payer: Medicaid Other

## 2018-02-01 NOTE — Telephone Encounter (Signed)
Patient returned phone call and stated he is not interested in the program. Closed referral.

## 2018-02-01 NOTE — Telephone Encounter (Signed)
Attempted to call patient to reschedule orientation - mother answered and stated she is at work and will have patient return phone call.

## 2018-02-02 ENCOUNTER — Encounter: Payer: Self-pay | Admitting: Cardiology

## 2018-02-02 ENCOUNTER — Telehealth: Payer: Self-pay | Admitting: *Deleted

## 2018-02-02 NOTE — Telephone Encounter (Signed)
Patient has a 10 week follow up appointment scheduled for March 21 2018. Patient understands he needs to keep this appointment for insurance compliance. Patient was grateful for the call and thanked me.

## 2018-02-03 ENCOUNTER — Ambulatory Visit (HOSPITAL_COMMUNITY): Payer: Medicaid Other

## 2018-02-03 NOTE — Telephone Encounter (Signed)
    Informed patient of titration results and verbalized understanding was indicated. Patient understands his titration study was successful and Dr Mayford Knife has ordered him a Resmed BIPAP S/T at 23/19 cm. Patient understands he will be contacted by [facility] to set up her cpap. He understands to call if [facility] does not contact his with new setup in a timely manner. He understands he will be called once confirmation has been received from [facility] that he has received his new machine to schedule 10 week follow up appointment.  [facility] notified of new cpap order in epic Please add to Spencer Robertson He was grateful for the call and thanked me

## 2018-02-03 NOTE — Telephone Encounter (Signed)
    Informed patient of sleep study results and patient understanding was verbalized. Patient understands his BiPAP titration was unsuccessful due to the severity of his sleep apnea. Patient understands he will need to repeat his BiPAP study using BiPAP S/T (spontaneous in time) this time. Patient understands his BiPAP Titration study is scheduled for Monday January 7. Patient understands his sleep study will be done at [facility] sleep lab. Patient understands he will receive a sleep packet in a week or so. Patient understands to call if he does not receive the sleep packet in a timely manner. Patient agrees with treatment and thanked me for call.

## 2018-02-03 NOTE — Telephone Encounter (Signed)
    Patient has a 10 week follow up appointment scheduled for March 21 2018. Patient understands he needs to keep this appointment for insurance compliance. Patient was grateful for the call and thanked me.

## 2018-02-06 ENCOUNTER — Ambulatory Visit (HOSPITAL_COMMUNITY): Payer: Medicaid Other

## 2018-02-08 ENCOUNTER — Encounter (HOSPITAL_COMMUNITY): Payer: Self-pay

## 2018-02-08 ENCOUNTER — Ambulatory Visit (HOSPITAL_COMMUNITY): Payer: Medicaid Other

## 2018-02-08 ENCOUNTER — Telehealth (HOSPITAL_COMMUNITY): Payer: Self-pay

## 2018-02-08 NOTE — Telephone Encounter (Signed)
Notes recorded by Teresa Coombs, RN on 02/08/2018 at 11:38 AM EST I have been unable to reach this patient by phone. A letter is being sent.  ------  Notes recorded by Teresa Coombs, RN on 02/01/2018 at 12:16 PM EST Has mag on [employer], want to verify he is taking ------  Notes recorded by Teresa Coombs, RN on 02/01/2018 at 12:15 PM EST Left VM  ------  Notes recorded by Laurey Morale, MD on 01/31/2018 at 10:18 PM EST Add magnesium oxide 400 mg daily.

## 2018-02-09 ENCOUNTER — Ambulatory Visit (HOSPITAL_COMMUNITY)
Admission: RE | Admit: 2018-02-09 | Discharge: 2018-02-09 | Disposition: A | Discharge status: Home / Self Care | Payer: Medicaid Other | Source: Ambulatory Visit | Attending: Internal Medicine | Admitting: Internal Medicine

## 2018-02-09 DIAGNOSIS — I5022 Chronic systolic (congestive) heart failure: Secondary | ICD-10-CM | POA: Insufficient documentation

## 2018-02-09 LAB — BASIC METABOLIC PANEL WITH GFR
Anion gap: 8 (ref 5–15)
BUN: 7 mg/dL (ref 6–20)
CO2: 26 mmol/L (ref 22–32)
Calcium: 9.1 mg/dL (ref 8.9–10.3)
Chloride: 100 mmol/L — ABNORMAL LOW (ref 101–111)
Creatinine, Ser: 1.07 mg/dL (ref 0.61–1.24)
GFR calc Af Amer: >60 mL/min
GFR calc non Af Amer: >60 mL/min
Glucose, Bld: 132 mg/dL — ABNORMAL HIGH (ref 65–99)
Potassium: 4.7 mmol/L (ref 3.5–5.1)
Sodium: 134 mmol/L — ABNORMAL LOW (ref 135–145)

## 2018-02-10 ENCOUNTER — Ambulatory Visit (HOSPITAL_COMMUNITY): Payer: Medicaid Other

## 2018-02-16 ENCOUNTER — Telehealth: Payer: Self-pay | Admitting: *Deleted

## 2018-02-16 DIAGNOSIS — G4733 Obstructive sleep apnea (adult) (pediatric): Secondary | ICD-10-CM

## 2018-02-16 LAB — CUP PACEART REMOTE DEVICE CHECK
Battery Remaining Percentage: 67 %
Brady Statistic RA Percent Paced: 35 %
Brady Statistic RV Percent Paced: 98 %
Date Time Interrogation Session: 20190225160200
HighPow Impedance: 62 Ohm
Implantable Lead Implant Date: 20051122
Implantable Lead Implant Date: 20051122
Implantable Lead Model: 158
Implantable Lead Model: 5076
Implantable Lead Serial Number: 159458
Lead Channel Impedance Value: 489 Ohm
Lead Channel Impedance Value: 543 Ohm
Lead Channel Pacing Threshold Amplitude: 0.8 V
Lead Channel Pacing Threshold Pulse Width: 0.4 ms
Lead Channel Pacing Threshold Pulse Width: 0.4 ms
Lead Channel Setting Pacing Amplitude: 2 V
Lead Channel Setting Pacing Amplitude: 2 V
Lead Channel Setting Pacing Pulse Width: 0.4 ms
Lead Channel Setting Sensing Sensitivity: 1 mV
MDC IDC LEAD IMPLANT DT: 20051122
MDC IDC LEAD LOCATION: 753858
MDC IDC LEAD LOCATION: 753859
MDC IDC LEAD LOCATION: 753860
MDC IDC MSMT BATTERY REMAINING LONGEVITY: 42 mo
MDC IDC MSMT LEADCHNL LV IMPEDANCE VALUE: 735 Ohm
MDC IDC MSMT LEADCHNL LV PACING THRESHOLD AMPLITUDE: 1.1 V
MDC IDC MSMT LEADCHNL RV PACING THRESHOLD AMPLITUDE: 0.9 V
MDC IDC MSMT LEADCHNL RV PACING THRESHOLD PULSEWIDTH: 0.4 ms
MDC IDC PG IMPLANT DT: 20110831
MDC IDC SET LEADCHNL LV PACING PULSEWIDTH: 0.4 ms
MDC IDC SET LEADCHNL RV PACING AMPLITUDE: 2.4 V
MDC IDC SET LEADCHNL RV SENSING SENSITIVITY: 0.5 mV
Pulse Gen Serial Number: 480425

## 2018-02-16 NOTE — Telephone Encounter (Signed)
-----   Message from Quintella Reichert, MD sent at 02/11/2018  5:22 PM EST ----- Patient still has significant OSA - please refer to Dr. Vassie Loll with pulmonary for further help with persistent apneas

## 2018-02-16 NOTE — Telephone Encounter (Signed)
Informed patient of compliance results and verbalized understanding was indicated. Patient understands he still has significant OSA with events of 23.5 times an hour. Patient understands Dr.Turner recommends a referral to see Dr. Vassie Loll with pulmonary for further help with persistent apneas. Patient agrees with treatment. Referral sent to Dr. Vassie Loll.

## 2018-02-20 ENCOUNTER — Ambulatory Visit: Payer: Medicaid Other | Admitting: Pulmonary Disease

## 2018-02-27 ENCOUNTER — Encounter: Payer: Self-pay | Admitting: Adult Health

## 2018-02-27 ENCOUNTER — Ambulatory Visit (INDEPENDENT_AMBULATORY_CARE_PROVIDER_SITE_OTHER): Payer: Medicaid Other | Admitting: Adult Health

## 2018-02-27 DIAGNOSIS — G4733 Obstructive sleep apnea (adult) (pediatric): Secondary | ICD-10-CM | POA: Diagnosis not present

## 2018-02-27 DIAGNOSIS — E669 Obesity, unspecified: Secondary | ICD-10-CM

## 2018-02-27 NOTE — Assessment & Plan Note (Signed)
Improved control on current regimen  Plan  Patient Instructions  Continue on BiPAP at bedtime Work on healthy weight Do not drive a sleepy Follow-up in 6 months with Dr. Vassie Loll and as needed

## 2018-02-27 NOTE — Patient Instructions (Signed)
Continue on BiPAP at bedtime Work on healthy weight Do not drive a sleepy Follow-up in 6 months with Dr. Vassie Loll and as needed

## 2018-02-27 NOTE — Progress Notes (Signed)
@Patient  ID: Spencer Robertson, male    DOB: [birth year], 57 y.o.   MRN: [id number]  Chief Complaint  Patient presents with  . Follow-up    osa     Referring provider: No ref. provider found  HPI: 57 year old male with known nonischemic cardiomyopathy followed for obstructive sleep apnea PMH CHF -EF 20%   TEST  severe OSA back in 2005 and was titrated to CPAP of 18 cm. home sleep testing in 2011 which confirmed severe OSA with AHI of 59/hour   02/27/2018 Follow up : OSA  Patient returns for a six-month follow-up.  Patient is on CPAP at bedtime for severe sleep apnea. Last visit patient had significant residuals.  With AHI 55/hr. patient's BiPAP settings were changed.  Patient was seen by Dr. Mayford Knife and set up for a CPAP titration study on December 12, 2017 that shows significant central sleep apnea persisted despite high BiPAP pressures.  Moderate central sleep apnea at 28\hour.  And SaO2 low 78%.  He was changed to a ResMed BiPAP S/T with a backup rate at 18.  He is currently on IPAP 23 centers H2O and EPAP 19 cm H2O.  Respiratory rate 18 Patient says since changing he is feeling well with no significant daytime sleepiness and feels that he benefits.  AHI 19.  Average use 80% at 7 hours.    Allergies  Allergen Reactions  . Spironolactone Other (See Comments)    gynecomastia    Immunization History  Administered Date(s) Administered  . Influenza-Unspecified 08/25/2015, 10/20/2016, 09/14/2017    Past Medical History:  Diagnosis Date  . CHF (congestive heart failure) (HCC)   . Hyperlipidemia   . Hypertension   . Mitral regurgitation   . Nonischemic cardiomyopathy (HCC)   . Obesity   . Obesity (BMI 30-39.9) 09/01/2015  . OSA (obstructive sleep apnea)    severe  . S/P implantation of automatic cardioverter/defibrillator (AICD)    Guidant Contact H177  . Tobacco abuse     Tobacco History: Social History   Tobacco Use  Smoking Status Current Some Day Smoker  .  Packs/day: 0.25  . Years: 20.00  . Pack years: 5.00  . Types: Cigarettes  Smokeless Tobacco Never Used   Ready to quit: No Counseling given: Yes   Outpatient Encounter Medications as of 02/27/2018  Medication Sig  . aspirin 81 MG tablet Take 81 mg by mouth daily.    . carvedilol (COREG) 25 MG tablet Take 1 tablet (25 mg total) 2 (two) times daily with a meal by mouth.  Marland Kitchen eplerenone (INSPRA) 25 MG tablet Take 1 tablet (25 mg total) daily by mouth.  . furosemide (LASIX) 40 MG tablet take 1 tablet by mouth twice a day  . magnesium oxide (MAG-OX) 400 MG tablet Take 1 tablet (400 mg total) by mouth daily.  . mometasone (NASONEX) 50 MCG/ACT nasal spray 2 sprays each nostril each AM  . potassium chloride SA (K-DUR,KLOR-CON) 20 MEQ tablet take 1 tablet by mouth once daily  . sacubitril-valsartan (ENTRESTO) 49-51 MG Take 1 tablet by mouth 2 (two) times daily.  . sotalol (BETAPACE) 80 MG tablet Take 80 mg by mouth 2 (two) times daily.  . nicotine polacrilex (COMMIT) 2 MG lozenge Take 1 lozenge (2 mg total) as needed by mouth for smoking cessation. (Patient not taking: Reported on 02/27/2018)   No facility-administered encounter medications on file as of 02/27/2018.      Review of Systems  Constitutional:   No  weight loss,  night sweats,  Fevers, chills, fatigue, or  lassitude.  HEENT:   No headaches,  Difficulty swallowing,  Tooth/dental problems, or  Sore throat,                No sneezing, itching, ear ache, nasal congestion, post nasal drip,   CV:  No chest pain,  Orthopnea, PND, swelling in lower extremities, anasarca, dizziness, palpitations, syncope.   GI  No heartburn, indigestion, abdominal pain, nausea, vomiting, diarrhea, change in bowel habits, loss of appetite, bloody stools.   Resp: No shortness of breath with exertion or at rest.  No excess mucus, no productive cough,  No non-productive cough,  No coughing up of blood.  No change in color of mucus.  No wheezing.  No chest wall  deformity  Skin: no rash or lesions.  GU: no dysuria, change in color of urine, no urgency or frequency.  No flank pain, no hematuria   MS:  No joint pain or swelling.  No decreased range of motion.  No back pain.    Physical Exam  BP 128/62 (BP Location: Right Arm, Cuff Size: Normal)   Pulse 71   Ht 5\' 11"  (1.803 m)   Wt 275 lb 12.8 oz (125.1 kg)   SpO2 94%   BMI 38.47 kg/m   GEN: A/Ox3; pleasant , NAD, obese    HEENT:  [place]/AT,  EACs-clear, TMs-wnl, NOSE-clear, THROAT-clear, no lesions, no postnasal drip or exudate noted.   NECK:  Supple w/ fair ROM; no JVD; normal carotid impulses w/o bruits; no thyromegaly or nodules palpated; no lymphadenopathy.    RESP  Clear  P & A; w/o, wheezes/ rales/ or rhonchi. no accessory muscle use, no dullness to percussion  CARD:  RRR, no m/r/g, no peripheral edema, pulses intact, no cyanosis or clubbing.  GI:   Soft & nt; nml bowel sounds; no organomegaly or masses detected.   Musco: Warm bil, no deformities or joint swelling noted.   Neuro: alert, no focal deficits noted.    Skin: Warm, no lesions or rashes    Lab Results:  CBC  BNP  Imaging: No results found.   Assessment & Plan:   OSA (obstructive sleep apnea) Improved control on current regimen  Plan  Patient Instructions  Continue on BiPAP at bedtime Work on healthy weight Do not drive a sleepy Follow-up in 6 months with Dr. Vassie Loll and as needed     Obesity (BMI 30-39.9) Wt loss      Rubye Oaks, NP 02/27/2018

## 2018-02-27 NOTE — Assessment & Plan Note (Signed)
Wt loss  

## 2018-03-02 ENCOUNTER — Ambulatory Visit (HOSPITAL_COMMUNITY)
Admission: RE | Admit: 2018-03-02 | Discharge: 2018-03-02 | Disposition: A | Discharge status: Home / Self Care | Payer: Medicaid Other | Source: Ambulatory Visit | Attending: Cardiology | Admitting: Cardiology

## 2018-03-02 DIAGNOSIS — Z8249 Family history of ischemic heart disease and other diseases of the circulatory system: Secondary | ICD-10-CM | POA: Insufficient documentation

## 2018-03-02 DIAGNOSIS — I428 Other cardiomyopathies: Secondary | ICD-10-CM | POA: Diagnosis present

## 2018-03-02 DIAGNOSIS — G4731 Primary central sleep apnea: Secondary | ICD-10-CM | POA: Diagnosis not present

## 2018-03-02 DIAGNOSIS — I471 Supraventricular tachycardia: Secondary | ICD-10-CM | POA: Insufficient documentation

## 2018-03-02 DIAGNOSIS — Z95810 Presence of automatic (implantable) cardiac defibrillator: Secondary | ICD-10-CM | POA: Diagnosis not present

## 2018-03-02 DIAGNOSIS — I5022 Chronic systolic (congestive) heart failure: Secondary | ICD-10-CM | POA: Diagnosis not present

## 2018-03-02 DIAGNOSIS — Z79899 Other long term (current) drug therapy: Secondary | ICD-10-CM | POA: Diagnosis not present

## 2018-03-02 DIAGNOSIS — F17200 Nicotine dependence, unspecified, uncomplicated: Secondary | ICD-10-CM | POA: Insufficient documentation

## 2018-03-02 MED ORDER — ISOSORB DINITRATE-HYDRALAZINE 20-37.5 MG PO TABS: 1.0000 | ORAL_TABLET | Freq: Three times a day (TID) | ORAL | 5 refills | Status: DC

## 2018-03-02 NOTE — Progress Notes (Signed)
HF MD: [facility]  HPI:  57 yo [place] American male with history of nonischemic cardiomyopathy with a long history of cardiomyopathy.He got a Environmental manager CRT-D device in 2005. Most recent echo in 7/18 showed EF 20-25% with diffuse hypokinesis and dilated RV. He has a strong family history of CHF (father, sister, half-brother). Sleep study in 12/18 with severe central sleep apnea, now on Bipap.History of gynecomastia on spironolactone.  At last visit on 01/30/18,Weight was down 10 lbs. No dyspnea walking on flat ground or going up 1 flight steps. No lightheadedness. No chest pain.   Patient arrives in pharmacy clinic today for HF medication titration. Took morning medications. Patient reports adherence to all medications, but does not monitor weight or BP at home. Still smoking about 6 cigarettes per day, down from 1/2 PPD. Patient BP is reduced this morning, but he is not experiencing any dizziness or lightheadedness.   Shortness of breath/dyspnea on exertion? no   Orthopnea/PND? no  Edema? no  Lightheadedness/dizziness? no  Daily weights at home? No, but reports weight in clinic today is stable for him  Blood pressure/heart rate monitoring at home? no  Following low-sodium/fluid-restricted diet? no  HF Medications: Carvedilol 25mg  BID Eplerenone 25mg  QD Furosemide 40mg  BID Magnesium oxide 400mg  QD Potassium chloride SA QD Entresto 49-51mg  BID  Has the patient been experiencing any side effects to the medications prescribed?  no  Does the patient have any problems obtaining medications due to transportation or finances?   No, [place] Medicaid  Understanding of regimen: fair Understanding of indications: fair Potential of compliance: good Patient understands to avoid NSAIDs. Patient understands to avoid decongestants.   Pertinent Lab Values:  02/09/18: Serum creatinine 1.07, Potassium 4.7, Sodium 134, Magnesium 1.7  Vital Signs:  Weight: 278.8 lbs (last  clinic visit weight 276 lbs)  Blood pressure: 102/58 mmHg  Heart rate: 66 bpm  01/30/18: BP 118/78, HR 63, Wt 276  Assessment: 1. Chronicsystolic CHF (EF 87-19%), due to NICM. NYHA class IIsymptoms. - Start Bidil 20/37.5mg  TID, advised to take a Tylenol if he experiences headache and to call with any dizziness/lightheadedness - Continue carvedilol 25mg  BID, furosemide 40mg  daily, Entresto 49/51mg  BID, and eplerenone 25mg  daily. - Basic disease state pathophysiology, medication indication, mechanism and side effects reviewed at length with patient and he verbalized understanding 2. Smoking:  - Advised smoking cessation. - He is trying to quit, has nicotine lozenges, but says they are nasty tasting so he spits them out. 3. Atrial Tachycardia:  - He is on sotalol 4.Central sleep apnea:  - Continue Bipap.  Plan: 1) Medication changes: Based on clinical presentation, vital signs and recent labs will start Bidil 20/37.5mg  TID. 2) Labs: PRN 3) Follow-up: Pharmacy clinic 4/10 and Dr. Shirlee Latch 5/28   Tyler Deis. Bonnye Fava, PharmD, BCPS, CPP Clinical Pharmacist Pager: [phone] Phone: [phone] 03/01/2018 4:25 PM

## 2018-03-02 NOTE — Patient Instructions (Addendum)
It was great to see you again today!  START Bidil 1 tablet three times a day.  Continue taking all of your other medications.  Follow up with the pharmacist, Cicero Duck, on 4/10 at 2pm.

## 2018-03-15 ENCOUNTER — Inpatient Hospital Stay (HOSPITAL_COMMUNITY)
Admission: RE | Admit: 2018-03-15 | Discharge: 2018-03-15 | Disposition: A | Discharge status: Home / Self Care | Payer: Medicaid Other | Source: Ambulatory Visit

## 2018-03-15 ENCOUNTER — Encounter: Payer: Self-pay | Admitting: Cardiology

## 2018-03-21 ENCOUNTER — Telehealth: Payer: Self-pay | Admitting: *Deleted

## 2018-03-21 ENCOUNTER — Ambulatory Visit: Payer: Medicaid Other | Admitting: Cardiology

## 2018-03-21 ENCOUNTER — Encounter: Payer: Self-pay | Admitting: Cardiology

## 2018-03-21 VITALS — BP 80/56 | HR 61 | Ht 71.0 in | Wt 281.0 lb

## 2018-03-21 DIAGNOSIS — G4733 Obstructive sleep apnea (adult) (pediatric): Secondary | ICD-10-CM

## 2018-03-21 DIAGNOSIS — I10 Essential (primary) hypertension: Secondary | ICD-10-CM

## 2018-03-21 DIAGNOSIS — E669 Obesity, unspecified: Secondary | ICD-10-CM

## 2018-03-21 LAB — BASIC METABOLIC PANEL
BUN/Creatinine Ratio: 10 (ref 9–20)
BUN: 11 mg/dL (ref 6–24)
CO2: 24 mmol/L (ref 20–29)
CREATININE: 1.06 mg/dL (ref 0.76–1.27)
Calcium: 9.2 mg/dL (ref 8.7–10.2)
Chloride: 99 mmol/L (ref 96–106)
GFR calc Af Amer: 91 mL/min/{1.73_m2} (ref >59)
GFR calc non Af Amer: 79 mL/min/{1.73_m2} (ref >59)
GLUCOSE: 91 mg/dL (ref 65–99)
POTASSIUM: 4.5 mmol/L (ref 3.5–5.2)
SODIUM: 135 mmol/L (ref 134–144)

## 2018-03-21 NOTE — Patient Instructions (Signed)
Medication Instructions:  Your physician recommends that you continue on your current medications as directed. Please refer to the Current Medication list given to you today.  Labwork: Your physician recommends that you have a BMP drawn today  Testing/Procedures: None ordered.  Follow-Up: Your physician wants you to follow-up in: One Year with Dr Mayford Knife. You will receive a reminder letter in the mail two months in advance. If you don't receive a letter, please call our office to schedule the follow-up appointment.   Any Other Special Instructions Will Be Listed Below (If Applicable).     If you need a refill on your cardiac medications before your next appointment, please call your pharmacy.

## 2018-03-21 NOTE — Addendum Note (Signed)
Addended by: Reesa Chew on: 03/21/2018 11:28 AM   Modules accepted: Orders

## 2018-03-21 NOTE — Progress Notes (Signed)
Cardiology Office Note:    Date:  03/21/2018   ID:  Spencer Robertson, DOB [birth year], MRN [id number]  PCP:  Patient, No Pcp Per  Cardiologist:  No primary care provider on file.    Referring MD: No ref. provider found   Chief Complaint  Patient presents with  . Sleep Apnea  . Hypertension    History of Present Illness:    Spencer Robertson is a 57 y.o. male with a hx of  severe OSA who has been noncompliant with CPAP in the past but have not seen him in 2 years.  He was he was referred by Vick Frees, PA-C for repeat sleep study as he was not using his CPAP.He was found to have severe obstructive sleep apnea with an AHI of 100.1.  He also had severe central sleep apnea with a central apnea index of 40.4/h.  He had nocturnal hypoxemia with oxygen saturations as low as 61%.  He underwent CPAP titration but cannot be adequately titrated due to ongoing respiratory events and was switched over to BiPAP.  He could not be adequately controlled on BiPAP therapy as well due to ongoing respiratory events and was placed on auto BiPAP titration at home but still had an elevated AHI.  He went back to sleep lab and was titrated on BiPAP to BiPAP S/T at 23/18cm H2O.  He is now here for followup.    He is doing well with his CPAP device and thinks that he has gotten used to it.  He tolerates the mask and feels the pressure is adequate.  Since going on CPAP he feels rested in the am and has no significant daytime sleepiness.  He denies any significant mouth or nasal dryness or nasal congestion.  He does not think that he snores.     Past Medical History:  Diagnosis Date  . CHF (congestive heart failure) ([place])   . Hyperlipidemia   . Hypertension   . Mitral regurgitation   . Nonischemic cardiomyopathy ([place])   . Obesity   . Obesity (BMI 30-39.9) 09/01/2015  . OSA (obstructive sleep apnea)    severe  . S/P implantation of automatic cardioverter/defibrillator (AICD)    Guidant Contact H177  . Tobacco abuse       Past Surgical History:  Procedure Laterality Date  . CARDIAC DEFIBRILLATOR PLACEMENT     Guidant Contak H177 device     Current Medications: Current Meds  Medication Sig  . aspirin 81 MG tablet Take 81 mg by mouth daily.    . carvedilol (COREG) 25 MG tablet Take 1 tablet (25 mg total) 2 (two) times daily with a meal by mouth.  Marland Kitchen eplerenone (INSPRA) 25 MG tablet Take 1 tablet (25 mg total) daily by mouth.  . furosemide (LASIX) 40 MG tablet take 1 tablet by mouth twice a day  . isosorbide-hydrALAZINE (BIDIL) 20-37.5 MG tablet Take 1 tablet by mouth 3 (three) times daily.  . magnesium oxide (MAG-OX) 400 MG tablet Take 1 tablet (400 mg total) by mouth daily.  . mometasone (NASONEX) 50 MCG/ACT nasal spray 2 sprays each nostril each AM  . nicotine polacrilex (COMMIT) 2 MG lozenge Take 1 lozenge (2 mg total) as needed by mouth for smoking cessation.  . potassium chloride SA (K-DUR,KLOR-CON) 20 MEQ tablet take 1 tablet by mouth once daily  . sacubitril-valsartan (ENTRESTO) 49-51 MG Take 1 tablet by mouth 2 (two) times daily.  . sotalol (BETAPACE) 80 MG tablet Take 80 mg by mouth 2 (  two) times daily.     Allergies:   Spironolactone   Social History   Socioeconomic History  . Marital status: Single    Spouse name: Not on file  . Number of children: Not on file  . Years of education: Not on file  . Highest education level: Not on file  Occupational History  . Occupation: disabled  Social Needs  . Financial resource strain: Not on file  . Food insecurity:    Worry: Not on file    Inability: Not on file  . Transportation needs:    Medical: Not on file    Non-medical: Not on file  Tobacco Use  . Smoking status: Current Some Day Smoker    Packs/day: 0.25    Years: 20.00    Pack years: 5.00    Types: Cigarettes  . Smokeless tobacco: Never Used  Substance and Sexual Activity  . Alcohol use: Yes    Comment: socially  . Drug use: Yes    Types: Marijuana    Comment: occasional   . Sexual activity: Not on file  Lifestyle  . Physical activity:    Days per week: Not on file    Minutes per session: Not on file  . Stress: Not on file  Relationships  . Social connections:    Talks on phone: Not on file    Gets together: Not on file    Attends religious service: Not on file    Active member of club or organization: Not on file    Attends meetings of clubs or organizations: Not on file    Relationship status: Not on file  Other Topics Concern  . Not on file  Social History Narrative  . Not on file     Family History: The patient's family history includes Cardiomyopathy in his father; Heart disease in his brother and sister; Hypertension in his unknown relative.  ROS:   Please see the history of present illness.    ROS  All other systems reviewed and negative.   EKGs/Labs/Other Studies Reviewed:    The following studies were reviewed today: PAP download  EKG:  EKG is not ordered today.   Recent Labs: 05/24/2017: Hemoglobin 16.8; Platelets 129 08/17/2017: ALT 23; B Natriuretic Peptide 113.7 01/30/2018: Magnesium 1.7 02/09/2018: BUN 7; Creatinine, Ser 1.07; Potassium 4.7; Sodium 134   Recent Lipid Panel    Component Value Date/Time   CHOL  09/13/2007 0405    162        ATP III CLASSIFICATION:  <200     mg/dL   Desirable  200-239  mg/dL   Borderline High  >=240    mg/dL   High   TRIG 77 09/13/2007 0405   HDL 46 09/13/2007 0405   CHOLHDL 3.5 09/13/2007 0405   VLDL 15 09/13/2007 0405   LDLCALC (H) 09/13/2007 0405    101        Total Cholesterol/HDL:CHD Risk Coronary Heart Disease Risk Table                     Men   Women  1/2 Average Risk   3.4   3.3    Physical Exam:    VS:  BP (!) 80/56   Pulse 61   Ht 5' 11" (1.803 m)   Wt 281 lb (127.5 kg)   SpO2 96%   BMI 39.19 kg/m     Wt Readings from Last 3 Encounters:  03/21/18 281 lb (127.5 kg)  03/02/18 278  lb 12.8 oz (126.5 kg)  02/27/18 275 lb 12.8 oz (125.1 kg)     GEN:  Well  nourished, well developed in no acute distress HEENT: Normal NECK: No JVD; No carotid bruits LYMPHATICS: No lymphadenopathy CARDIAC: RRR, no murmurs, rubs, gallops RESPIRATORY:  Clear to auscultation without rales, wheezing or rhonchi  ABDOMEN: Soft, non-tender, non-distended MUSCULOSKELETAL:  No edema; No deformity  SKIN: Warm and dry NEUROLOGIC:  Alert and oriented x 3 PSYCHIATRIC:  Normal affect   ASSESSMENT:    1. OSA (obstructive sleep apnea)   2. Benign essential HTN   3. Obesity (BMI 30-39.9)    PLAN:    In order of problems listed above:  1.  OSA - the patient is tolerating PAP therapy well without any problems. The PAP S/T download was reviewed today and showed an AHI of 23/19/hr on 23/19 cm H2O with 70% compliance in using more than 4 hours nightly.  The patient has been using and benefiting from PAP use and will continue to benefit from therapy.  His AHI is elevated but I think it is due to a mask leak.  He says the mask leaks into his eyes.  He got a new mask in January and is trying to get new cushions for the mask but his DME says that his insurance will pay for it until July.  I have put an order in to get a fissure pedicle mask in July which should fit better on his face.  I will talk to the DME again about getting new cushion since his mask is leaking.  He does not sleep on his back.  2.  HTN - BP is well controlled on exam today.  He will continue on entresto 49-72m BID, Carvedilol 287mBID and Bidil 20-37.10m43mID.  His blood pressure was initially low this morning but when repeated was 97/66 supine.  Orthostatics were done and blood pressure was 94/61 sitting, 102/65 standing, 100/70 after standing for 3 minutes.  He is asymptomatic at this time although he did have some mild dizziness when first starting the higher dose of Entresto.  I spoke with Dr. McCMarigene Ehlersday and we will check a be met to make sure his renal function is normal.  If his creatinine has bumped then we  will decrease his Entresto to 24-26 mg twice daily  3.  Obesity - I have encouraged him to get into a routine exercise program and cut back on carbs and portions.    Medication Adjustments/Labs and Tests Ordered: Current medicines are reviewed at length with the patient today.  Concerns regarding medicines are outlined above.  Orders Placed This Encounter  Procedures  . Basic metabolic panel   No orders of the defined types were placed in this encounter.   Signed, TraFransico HimD  03/21/2018 8:59 AM    [place]

## 2018-03-21 NOTE — Telephone Encounter (Addendum)
  Spencer Reichert, MD  Reesa Chew, CMA        Patient has a high leakage on his device download with increased AHI. His mask is leaking and blowing into his face and eyes. Would like to change to a Fisher Paykel mask if possible as them ask he has is riding up over his chin.  Armanda Magic, MD    Patient was in the office today for a visit and was in need of a new mask because he has a very large mask leak and his AHI is up to 21 at night.. I reached out to [facility] to see how we could help this patient and was informed the patient received new mask and headgear on January 05 2018 and is not eligible again for supplies until July 2019. Dr Mayford Knife asked for new cushions for his mask to help stop the leakage but was denied because that is considered supplies also. Finally [facility]) said to please write an order for a mask re-fit due to high leakage.  Also write the order for the Fischer Pachel mask to be delivered in July when he is eligible and we were able to get our patient what he needed. Order will be sent today.

## 2018-03-29 ENCOUNTER — Ambulatory Visit (HOSPITAL_COMMUNITY)
Admission: RE | Admit: 2018-03-29 | Discharge: 2018-03-29 | Disposition: A | Discharge status: Home / Self Care | Payer: Medicaid Other | Source: Ambulatory Visit | Attending: Cardiology | Admitting: Cardiology

## 2018-03-29 VITALS — BP 98/72 | HR 62 | Wt 275.0 lb

## 2018-03-29 DIAGNOSIS — I5022 Chronic systolic (congestive) heart failure: Secondary | ICD-10-CM | POA: Diagnosis present

## 2018-03-29 DIAGNOSIS — F17210 Nicotine dependence, cigarettes, uncomplicated: Secondary | ICD-10-CM | POA: Diagnosis not present

## 2018-03-29 DIAGNOSIS — I471 Supraventricular tachycardia: Secondary | ICD-10-CM | POA: Insufficient documentation

## 2018-03-29 DIAGNOSIS — Z720 Tobacco use: Secondary | ICD-10-CM

## 2018-03-29 DIAGNOSIS — G4731 Primary central sleep apnea: Secondary | ICD-10-CM | POA: Diagnosis not present

## 2018-03-29 MED ORDER — NICOTINE 14 MG/24HR TD PT24: 14.0000 mg | MEDICATED_PATCH | Freq: Every day | TRANSDERMAL | 0 refills | Status: DC

## 2018-03-29 NOTE — Progress Notes (Signed)
HF MD: [facility]  HPI: 16 yoAfrican American malewith history of nonischemic cardiomyopathy with a long history of cardiomyopathy.He got a Environmental manager CRT-D device in 2005. Most recent echo in 7/18 showed EF 20-25% with diffuse hypokinesis and dilated RV. He has a strong family history of CHF (father, sister, half-brother). Sleep study in 12/18 with severe central sleep apnea, now on Bipap.History of gynecomastia on spironolactone.  Presents today for pharmacist-led HF medication titration. States that he took his AM medications. At last pharmacy visit on 03/02/18, Bidil 20/37.5mg  TID was started. At cardiologist visit on 03/21/18, orthostatics were conducted due to low BP at clinic and pt was not orthostatic. At that visit, he also reported mild dizziness when first starting higher dose of Entresto in the past. Repeat BMET on 03/21/18 showed serum creatinine WNL, and the current dose of Entresto was continued. Still smokes 4 cigarettes/day, down from 6 cigarettes/day since last pharmacy visit. Trying to quit cold [place] - does not use nicotine lozenges due to taste, but willing to try nicotine patch again.     Shortness of breath/dyspnea on exertion? no   Orthopnea/PND? no  Edema? no  Lightheadedness/dizziness? Yes, but improved  Daily weights at home? No  Blood pressure/heart rate monitoring at home? no  Following low-sodium/fluid-restricted diet? No, adds salt when he cooks  HF Medications: Carvedilol 25mg  BID Eplerenone 25mg  daily Furosemide 40mg  BID Magnesium Oxide 400mg  daily Bidil 20/37.5mg  TID Entresto 49/51mg  BID Potassium chloride  daily  Has the patient been experiencing any side effects to the medications prescribed?  no  Does the patient have any problems obtaining medications due to transportation or finances?   No, [place] Medicaid   Understanding of regimen: fair Understanding of indications: fair Potential of compliance: good Patient understands to  avoid NSAIDs. Patient understands to avoid decongestants.  Pertinent Lab Values:  03/21/18 Serum creatinine 1.06, Potassium 4.5, Sodium 135  02/09/18: Serum creatinine 1.07, Potassium 4.7, Sodium 134  01/30/18: Magnesium 1.7  Vital Signs:  Weight: 275 lbs (last clinic visit weight 278.8 lbs)  Blood pressure: 98/72 mmHg  Heart rate: 62 bpm  03/02/18: BP 102/58, HR 66  Assessment: 1. Chronicsystolic CHF (EF20-25%), due toNICM. NYHA classIsymptoms. -Volume status stable -Continue carvedilol 25mg  BID, furosemide 40mg  daily, Entresto 49/51mg  BID, eplerenone 25mg  daily, and Bidil 20/37.5mg  TID. -Due to consistently low BP,he is likely optimized on his medication regimen at this time -Basic disease state pathophysiology, medication indication, mechanism and side effects reviewed at length with patient and he verbalized understanding 2. Smoking: -He still smokes 4 cigarettes per day, down from 6 cigarettes per day  -He is trying to quit cold [place] - does not use nicotine lozenges due to taste, but willing to try nicotine patch again -Plan for nicotine patch 14mg /day for 6 weeks, then 7mg /day for 2 weeks 3. Atrial Tachycardia: -He is on sotalol 4.Central sleep apnea: -Continue Bipap  Plan: 1) Medication changes: Based on clinical presentation, vital signs and recent labs will no medication changes at this time 2) Labs: PRN 3) Follow-up: Dr. Shirlee Latch on 05/02/18   Tyler Deis. Bonnye Fava, PharmD, BCPS, CPP Clinical Pharmacist Pager: [phone] Phone: [phone] 03/28/2018 11:38 AM

## 2018-03-29 NOTE — Patient Instructions (Addendum)
It was great seeing you today!  No medication changes today. Please continue all your medications.  Please keep your appointment with Dr. Shirlee Latch on 05/02/2018

## 2018-04-07 NOTE — Telephone Encounter (Signed)
Download report came today from patients DME for patient on his Bipap with an AHI of 32.3 per hour. Normal range is 5 per hour or less. The RT states he is not eligible for a new mask until July 1st.  Per Dr Mayford Knife ambulatory referral to Dr Craige Cotta at [facility] has been placed.

## 2018-04-07 NOTE — Addendum Note (Signed)
Addended by: Reesa Chew on: 04/07/2018 02:35 PM   Modules accepted: Orders

## 2018-04-21 ENCOUNTER — Telehealth: Payer: Self-pay | Admitting: *Deleted

## 2018-04-21 NOTE — Telephone Encounter (Signed)
-----   Message from Quintella Reichert, MD sent at 04/18/2018  8:54 AM EDT ----- Please find out if he got new cushion for his mask and if this download was on the new cushions

## 2018-04-21 NOTE — Telephone Encounter (Signed)
Patient states he did receive his new cushions for his new mask and he believes the download was with the new cushions. Per [facility] his download now shows 37.4

## 2018-05-02 ENCOUNTER — Ambulatory Visit (HOSPITAL_COMMUNITY)
Admission: RE | Admit: 2018-05-02 | Discharge: 2018-05-02 | Disposition: A | Discharge status: Home / Self Care | Payer: Medicaid Other | Source: Ambulatory Visit | Attending: Cardiology | Admitting: Cardiology

## 2018-05-02 ENCOUNTER — Encounter: Payer: Medicaid Other | Admitting: *Deleted

## 2018-05-02 ENCOUNTER — Other Ambulatory Visit: Payer: Self-pay

## 2018-05-02 ENCOUNTER — Encounter (HOSPITAL_COMMUNITY): Payer: Self-pay | Admitting: Cardiology

## 2018-05-02 VITALS — BP 122/58 | HR 61 | Wt 278.5 lb

## 2018-05-02 DIAGNOSIS — Z7982 Long term (current) use of aspirin: Secondary | ICD-10-CM | POA: Insufficient documentation

## 2018-05-02 DIAGNOSIS — I471 Supraventricular tachycardia: Secondary | ICD-10-CM | POA: Diagnosis not present

## 2018-05-02 DIAGNOSIS — G4731 Primary central sleep apnea: Secondary | ICD-10-CM | POA: Insufficient documentation

## 2018-05-02 DIAGNOSIS — Z8249 Family history of ischemic heart disease and other diseases of the circulatory system: Secondary | ICD-10-CM | POA: Insufficient documentation

## 2018-05-02 DIAGNOSIS — I5022 Chronic systolic (congestive) heart failure: Secondary | ICD-10-CM

## 2018-05-02 DIAGNOSIS — Z79899 Other long term (current) drug therapy: Secondary | ICD-10-CM | POA: Diagnosis not present

## 2018-05-02 DIAGNOSIS — G4733 Obstructive sleep apnea (adult) (pediatric): Secondary | ICD-10-CM | POA: Diagnosis not present

## 2018-05-02 DIAGNOSIS — I429 Cardiomyopathy, unspecified: Secondary | ICD-10-CM | POA: Insufficient documentation

## 2018-05-02 DIAGNOSIS — Z720 Tobacco use: Secondary | ICD-10-CM | POA: Diagnosis not present

## 2018-05-02 DIAGNOSIS — N62 Hypertrophy of breast: Secondary | ICD-10-CM | POA: Diagnosis not present

## 2018-05-02 DIAGNOSIS — F17210 Nicotine dependence, cigarettes, uncomplicated: Secondary | ICD-10-CM | POA: Diagnosis not present

## 2018-05-02 LAB — BASIC METABOLIC PANEL
ANION GAP: 8 (ref 5–15)
BUN: 13 mg/dL (ref 6–20)
CALCIUM: 9 mg/dL (ref 8.9–10.3)
CO2: 28 mmol/L (ref 22–32)
CREATININE: 1.04 mg/dL (ref 0.61–1.24)
Chloride: 103 mmol/L (ref 101–111)
Glucose, Bld: 126 mg/dL — ABNORMAL HIGH (ref 65–99)
Potassium: 3.5 mmol/L (ref 3.5–5.1)
SODIUM: 139 mmol/L (ref 135–145)

## 2018-05-02 MED ORDER — SACUBITRIL-VALSARTAN 97-103 MG PO TABS: 1.0000 | ORAL_TABLET | Freq: Two times a day (BID) | ORAL | 3 refills | Status: DC

## 2018-05-02 NOTE — Progress Notes (Signed)
Cardiology: Dr. Graciela Husbands HF Cardiology: Dr. Shirlee Latch  57 y.o. with history of nonischemic cardiomyopathy with a long history of cardiomyopathy.  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  Most recent echo in 7/18 showed EF 20-25% with diffuse hypokinesis and dilated RV.  He has a strong family history of CHF (father, sister, half-brother).  Sleep study in 12/18 with severe central sleep apnea, now on Bipap.   He retuns for HF followup. Weight is stable.  He is still smoking but trying to quit using a nicotine patch.  No dyspnea walking on flat ground.  He can walk up a flight of stairs without problems.  No palpitations.  No orthopnea/PND.  No chest pain.    ECG (personally reviewed): a-paced, BiV-paced with QTc 544 msec.   Labs (8/18): K 4.3, creatinine 0.88 Labs (08/17/2017) K 4.1 Creatinine 1.17, BNP 114 Labs (11/18): K 3.9, creatinine 1.14 Labs (4/19): K 4.5, creatinine 1.06  PMH: 1. Central sleep apnea: Uses Bipap.  2. LBBB: s/p [employer] CRT-D.   3. 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  4. History of atrial tachycardia 5. Active smoker 6. Chronic systolic CHF: nonischemic cardiomyopathy.  Known since prior to 2005.  [employer] CRT-D.  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  - Echo (7/18): EF 20-25%, severe dilated LV, diffuse hypokinesis, severe RV dilation with low normal systolic function, moderate TR, PASP 52 mmHg.  - Gynecomastia with spironolactone.   SH: Smokes 7 cigs/day, rare ETOH, occasional marijuana.   Lives with friend in [place].   FH: Father with cardiomyopathy, 1/2 brother died from CHF, sister with CHF.   ROS: All systems reviewed and negative except as per HPI.   Current Outpatient Medications  Medication Sig Dispense Refill  . aspirin 81 MG tablet Take 81 mg by mouth daily.      . carvedilol (COREG) 25 MG tablet Take 1 tablet (25 mg total) 2 (two) times daily with a meal by mouth. 180 tablet 2  . eplerenone (INSPRA) 25 MG tablet Take 1 tablet (25  mg total) daily by mouth. 30 tablet 5  . furosemide (LASIX) 40 MG tablet take 1 tablet by mouth twice a day 60 tablet 5  . isosorbide-hydrALAZINE (BIDIL) 20-37.5 MG tablet Take 1 tablet by mouth 3 (three) times daily. 90 tablet 5  . magnesium oxide (MAG-OX) 400 MG tablet Take 1 tablet (400 mg total) by mouth daily. 30 tablet 3  . mometasone (NASONEX) 50 MCG/ACT nasal spray 2 sprays each nostril each AM 17 g 3  . nicotine (NICODERM CQ - DOSED IN MG/24 HOURS) 14 mg/24hr patch Place 1 patch (14 mg total) onto the skin daily. 28 patch 0  . potassium chloride SA (K-DUR,KLOR-CON) 20 MEQ tablet take 1 tablet by mouth once daily 30 tablet 9  . sotalol (BETAPACE) 80 MG tablet Take 80 mg by mouth 2 (two) times daily.    . sacubitril-valsartan (ENTRESTO) 97-103 MG Take 1 tablet by mouth 2 (two) times daily. 60 tablet 3   No current facility-administered medications for this encounter.    BP (!) 122/58   Pulse 61   Wt 278 lb 8 oz (126.3 kg)   SpO2 96%   BMI 38.84 kg/m   General: NAD, obese Neck: No JVD, no thyromegaly or thyroid nodule.  Lungs: Clear to auscultation bilaterally with normal respiratory effort. CV: Nondisplaced PMI.  Heart regular S1/S2, no S3/S4, no murmur.  No peripheral edema.  No carotid bruit.  Normal pedal pulses.  Abdomen:  Soft, nontender, no hepatosplenomegaly, no distention.  Skin: Intact without lesions or rashes.  Neurologic: Alert and oriented x 3.  Psych: Normal affect. Extremities: No clubbing or cyanosis.  HEENT: Normal.   Assessment/Plan: 1. Chronic systolic CHF: Nonischemic cardiomyopathy x years.  [employer] CRT-D device.  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  ECHO EF 06/2017 EF 20-25%. He is not volume overloaded on exam.  NYHA class II symptoms. - Continue lasix 40 mg bid, BMET today.  - Increase Entresto to 97/103 bid and repeat BMET 10 days.  - Continue Coreg 25 mg bid.  - Intolerant spiro due to gynecomastia. Continue  eplerenone 25 mg daily.   - Continue Bidil 1 tab tid.   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 - Taking sotalol b/c of high DFTs.  Paced QTc acceptable on ECG today.  - Repeat echo in 7/19.  2. Smoking: Discussed smoking cessation.  He is trying to quit, has nicotine patches.  3. Atrial Tachycardia: He is on sotalol, paced QTc ok today.  4. Central sleep apnea: Continue Bipap.    Followup with me in 3 months.   Marca Ancona 05/02/2018

## 2018-05-02 NOTE — Patient Instructions (Signed)
Increase Entresto 97/103 mg (1 tab), twice a day  Your physician has requested that you have an echocardiogram. Echocardiography is a painless test that uses sound waves to create images of your heart. It provides your doctor with information about the size and shape of your heart and how well your heart's chambers and valves are working. This procedure takes approximately one hour. There are no restrictions for this procedure.  (they will call you)   Labs drawn today (if we do not call you, then your lab work was stable)   Your physician recommends that you return for lab work in: 10 days   Your physician recommends that you schedule a follow-up appointment in: 3 months with Dr. Shirlee Latch

## 2018-05-03 ENCOUNTER — Other Ambulatory Visit (HOSPITAL_COMMUNITY): Payer: Self-pay | Admitting: *Deleted

## 2018-05-03 MED ORDER — EPLERENONE 25 MG PO TABS: 25.0000 mg | ORAL_TABLET | Freq: Every day | ORAL | 5 refills | Status: DC

## 2018-05-05 ENCOUNTER — Ambulatory Visit (HOSPITAL_COMMUNITY): Payer: Medicaid Other | Attending: Cardiology

## 2018-05-05 ENCOUNTER — Encounter: Payer: Self-pay | Admitting: Cardiology

## 2018-05-05 ENCOUNTER — Other Ambulatory Visit: Payer: Self-pay

## 2018-05-05 DIAGNOSIS — I110 Hypertensive heart disease with heart failure: Secondary | ICD-10-CM | POA: Insufficient documentation

## 2018-05-05 DIAGNOSIS — I429 Cardiomyopathy, unspecified: Secondary | ICD-10-CM | POA: Insufficient documentation

## 2018-05-05 DIAGNOSIS — I340 Nonrheumatic mitral (valve) insufficiency: Secondary | ICD-10-CM | POA: Diagnosis not present

## 2018-05-05 DIAGNOSIS — Z6838 Body mass index (BMI) 38.0-38.9, adult: Secondary | ICD-10-CM | POA: Diagnosis not present

## 2018-05-05 DIAGNOSIS — G4733 Obstructive sleep apnea (adult) (pediatric): Secondary | ICD-10-CM | POA: Diagnosis not present

## 2018-05-05 DIAGNOSIS — I5022 Chronic systolic (congestive) heart failure: Secondary | ICD-10-CM | POA: Insufficient documentation

## 2018-05-05 DIAGNOSIS — Z720 Tobacco use: Secondary | ICD-10-CM | POA: Insufficient documentation

## 2018-05-05 DIAGNOSIS — E669 Obesity, unspecified: Secondary | ICD-10-CM | POA: Diagnosis not present

## 2018-05-05 DIAGNOSIS — E785 Hyperlipidemia, unspecified: Secondary | ICD-10-CM | POA: Insufficient documentation

## 2018-05-05 MED ORDER — PERFLUTREN LIPID MICROSPHERE
1.0000 mL | INTRAVENOUS | Status: AC | PRN
  Administered 2018-05-05: 2 mL via INTRAVENOUS

## 2018-05-10 ENCOUNTER — Other Ambulatory Visit (HOSPITAL_COMMUNITY): Payer: Self-pay | Admitting: *Deleted

## 2018-05-10 DIAGNOSIS — I5022 Chronic systolic (congestive) heart failure: Secondary | ICD-10-CM

## 2018-05-12 ENCOUNTER — Ambulatory Visit (HOSPITAL_COMMUNITY)
Admission: RE | Admit: 2018-05-12 | Discharge: 2018-05-12 | Disposition: A | Discharge status: Home / Self Care | Payer: Medicaid Other | Source: Ambulatory Visit | Attending: Cardiology | Admitting: Cardiology

## 2018-05-12 ENCOUNTER — Other Ambulatory Visit (HOSPITAL_COMMUNITY): Payer: Medicaid Other

## 2018-05-12 DIAGNOSIS — I5022 Chronic systolic (congestive) heart failure: Secondary | ICD-10-CM | POA: Diagnosis present

## 2018-05-12 LAB — BASIC METABOLIC PANEL
Anion gap: 6 (ref 5–15)
BUN: 12 mg/dL (ref 6–20)
CALCIUM: 9.2 mg/dL (ref 8.9–10.3)
CHLORIDE: 104 mmol/L (ref 101–111)
CO2: 27 mmol/L (ref 22–32)
CREATININE: 1.09 mg/dL (ref 0.61–1.24)
GFR calc non Af Amer: >60 mL/min (ref >60)
GLUCOSE: 79 mg/dL (ref 65–99)
Potassium: 4 mmol/L (ref 3.5–5.1)
Sodium: 137 mmol/L (ref 135–145)

## 2018-05-23 ENCOUNTER — Encounter (HOSPITAL_COMMUNITY): Payer: Self-pay

## 2018-05-23 ENCOUNTER — Telehealth (HOSPITAL_COMMUNITY): Payer: Self-pay

## 2018-05-23 NOTE — Telephone Encounter (Signed)
Notes recorded by Teresa Coombs, RN on 05/23/2018 at 11:07 AM EDT I have been unable to reach this patient by phone. A letter is being sent.  ------  Notes recorded by Teresa Coombs, RN on 05/12/2018 at 4:50 PM EDT Left message to call back   ------  Notes recorded by Teresa Coombs, RN on 05/08/2018 at 4:38 PM EDT Left message to call back ------  Notes recorded by Laurey Morale, MD on 05/07/2018 at 3:47 PM EDT EF 25-30%, still low.

## 2018-05-26 ENCOUNTER — Encounter: Payer: Self-pay | Admitting: Cardiology

## 2018-05-26 NOTE — Telephone Encounter (Signed)
Patient called back and he is aware of results.  No further questions.

## 2018-07-20 ENCOUNTER — Ambulatory Visit (HOSPITAL_COMMUNITY): Payer: Medicaid Other | Attending: Cardiology

## 2018-08-03 ENCOUNTER — Other Ambulatory Visit: Payer: Self-pay

## 2018-08-03 ENCOUNTER — Ambulatory Visit (HOSPITAL_COMMUNITY): Payer: Medicaid Other | Attending: Cardiology

## 2018-08-03 DIAGNOSIS — Z720 Tobacco use: Secondary | ICD-10-CM | POA: Diagnosis not present

## 2018-08-03 DIAGNOSIS — I429 Cardiomyopathy, unspecified: Secondary | ICD-10-CM | POA: Insufficient documentation

## 2018-08-03 DIAGNOSIS — E785 Hyperlipidemia, unspecified: Secondary | ICD-10-CM | POA: Diagnosis not present

## 2018-08-03 DIAGNOSIS — I5022 Chronic systolic (congestive) heart failure: Secondary | ICD-10-CM | POA: Insufficient documentation

## 2018-08-03 DIAGNOSIS — Z95810 Presence of automatic (implantable) cardiac defibrillator: Secondary | ICD-10-CM | POA: Insufficient documentation

## 2018-08-03 DIAGNOSIS — E669 Obesity, unspecified: Secondary | ICD-10-CM | POA: Diagnosis not present

## 2018-08-03 DIAGNOSIS — I340 Nonrheumatic mitral (valve) insufficiency: Secondary | ICD-10-CM | POA: Diagnosis not present

## 2018-08-03 DIAGNOSIS — I110 Hypertensive heart disease with heart failure: Secondary | ICD-10-CM | POA: Diagnosis not present

## 2018-08-03 DIAGNOSIS — Z6838 Body mass index (BMI) 38.0-38.9, adult: Secondary | ICD-10-CM | POA: Diagnosis not present

## 2018-08-03 MED ORDER — PERFLUTREN LIPID MICROSPHERE
1.0000 mL | INTRAVENOUS | Status: AC | PRN
  Administered 2018-08-03: 3 mL via INTRAVENOUS

## 2018-08-04 ENCOUNTER — Other Ambulatory Visit: Payer: Self-pay

## 2018-08-04 ENCOUNTER — Ambulatory Visit (HOSPITAL_COMMUNITY)
Admission: RE | Admit: 2018-08-04 | Discharge: 2018-08-04 | Disposition: A | Discharge status: Home / Self Care | Payer: Medicaid Other | Source: Ambulatory Visit | Attending: Cardiology | Admitting: Cardiology

## 2018-08-04 VITALS — BP 106/56 | HR 60 | Wt 276.4 lb

## 2018-08-04 DIAGNOSIS — G4731 Primary central sleep apnea: Secondary | ICD-10-CM | POA: Diagnosis not present

## 2018-08-04 DIAGNOSIS — Z8679 Personal history of other diseases of the circulatory system: Secondary | ICD-10-CM | POA: Insufficient documentation

## 2018-08-04 DIAGNOSIS — N62 Hypertrophy of breast: Secondary | ICD-10-CM | POA: Diagnosis not present

## 2018-08-04 DIAGNOSIS — I471 Supraventricular tachycardia: Secondary | ICD-10-CM | POA: Diagnosis not present

## 2018-08-04 DIAGNOSIS — Z720 Tobacco use: Secondary | ICD-10-CM | POA: Diagnosis not present

## 2018-08-04 DIAGNOSIS — F17210 Nicotine dependence, cigarettes, uncomplicated: Secondary | ICD-10-CM | POA: Insufficient documentation

## 2018-08-04 DIAGNOSIS — Z8249 Family history of ischemic heart disease and other diseases of the circulatory system: Secondary | ICD-10-CM | POA: Insufficient documentation

## 2018-08-04 DIAGNOSIS — Z7982 Long term (current) use of aspirin: Secondary | ICD-10-CM | POA: Diagnosis not present

## 2018-08-04 DIAGNOSIS — F1290 Cannabis use, unspecified, uncomplicated: Secondary | ICD-10-CM | POA: Insufficient documentation

## 2018-08-04 DIAGNOSIS — Z950 Presence of cardiac pacemaker: Secondary | ICD-10-CM | POA: Insufficient documentation

## 2018-08-04 DIAGNOSIS — I5022 Chronic systolic (congestive) heart failure: Secondary | ICD-10-CM | POA: Diagnosis not present

## 2018-08-04 DIAGNOSIS — Z79899 Other long term (current) drug therapy: Secondary | ICD-10-CM | POA: Diagnosis not present

## 2018-08-04 LAB — BASIC METABOLIC PANEL
ANION GAP: 7 (ref 5–15)
BUN: 13 mg/dL (ref 6–20)
CALCIUM: 9.2 mg/dL (ref 8.9–10.3)
CO2: 25 mmol/L (ref 22–32)
Chloride: 106 mmol/L (ref 98–111)
Creatinine, Ser: 1.07 mg/dL (ref 0.61–1.24)
GFR calc Af Amer: >60 mL/min (ref >60)
Glucose, Bld: 103 mg/dL — ABNORMAL HIGH (ref 70–99)
POTASSIUM: 4 mmol/L (ref 3.5–5.1)
SODIUM: 138 mmol/L (ref 135–145)

## 2018-08-04 MED ORDER — EPLERENONE 50 MG PO TABS: 50.0000 mg | ORAL_TABLET | Freq: Every day | ORAL | 11 refills | Status: DC

## 2018-08-04 NOTE — Patient Instructions (Signed)
Will schedule you for Cardiopulmonary Exercise Test. This test is done at our [facility]. Please wear comfortable clothes and shoes for this test. Avoid heavy meal before the test (light snack/meal recommended). Avoid caffeine, alcohol, tobacco products 12 hrs before test. Please give 24 hr notice for cancellations/rescheduling: [phone].  _____________________________________________________________ [place] Code: 1600  INCREASE Inspra (Epleronone) to 50 mg once daily. Can double up on current 25 mg tablets you have at home (Take 2 tabs once daily). New Rx has been sent to your pharmacy for 50 mg tablets (Take 1 tab once daily).  Routine lab work today. Will notify you of abnormal results, otherwise no news is good news!  Return for repeat labs in 2 weeks.  ______________________________________________________________ [place] Code: 1600  Follow up 3 months with Dr. Shirlee Latch.  _______________________________________________________________ [place] Code: 1800  Take all medication as prescribed the day of your appointment. Bring all medications with you to your appointment.  Do the following things EVERYDAY: 1) Weigh yourself in the morning before breakfast. Write it down and keep it in a log. 2) Take your medicines as prescribed 3) Eat low salt foods-Limit salt (sodium) to 2000 mg per day.  4) Stay as active as you can everyday 5) Limit all fluids for the day to less than 2 liters

## 2018-08-07 NOTE — Progress Notes (Signed)
Cardiology: Dr. Graciela Husbands HF Cardiology: Dr. Shirlee Latch  57 y.o. with history of nonischemic cardiomyopathy with a long history of cardiomyopathy.  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  Echo in 7/18 showed EF 20-25% with diffuse hypokinesis and dilated RV.  He has a strong family history of CHF (father, sister, half-brother).  Sleep study in 12/18 with severe central sleep apnea, now on Bipap.  Echo in 8/19 showed EF 20-25%, moderate MR, mildly decreased RV systolic function.   He returns for HF followup. Symptomatically doing well.  Weight down 2 lbs.  He is still smoking but trying to cut back.  No dyspnea, no problems climbing a flight of stairs.  No chest pain.  Rare lightheadedness if he stands too rapidly.  He is in NSR today.  No orthopnea/PND.     ECG (personally reviewed): NSR, BiV paced with QRS 484 msec.   Labs (8/18): K 4.3, creatinine 0.88 Labs (08/17/2017) K 4.1 Creatinine 1.17, BNP 114 Labs (11/18): K 3.9, creatinine 1.14 Labs (4/19): K 4.5, creatinine 1.06 Labs (6/19): K 4, creatinine 1.09  PMH: 1. Central sleep apnea: Uses Bipap.  2. LBBB: s/p [employer] CRT-D.   3. 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  4. History of atrial tachycardia 5. Active smoker 6. Chronic systolic CHF: nonischemic cardiomyopathy.  Known since prior to 2005.  [employer] CRT-D.  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  - Echo (7/18): EF 20-25%, severe dilated LV, diffuse hypokinesis, severe RV dilation with low normal systolic function, moderate TR, PASP 52 mmHg.  - Gynecomastia with spironolactone.  - Echo (8/19): EF 20-25%, moderate MR, mildly decreased RV systolic function.   SH: Smokes 7 cigs/day, rare ETOH, occasional marijuana.   Lives with friend in [place].   FH: Father with cardiomyopathy, 1/2 brother died from CHF, sister with CHF.   ROS: All systems reviewed and negative except as per HPI.   Current Outpatient Medications  Medication Sig Dispense Refill  . aspirin 81 MG  tablet Take 81 mg by mouth daily.      . carvedilol (COREG) 25 MG tablet Take 1 tablet (25 mg total) 2 (two) times daily with a meal by mouth. 180 tablet 2  . eplerenone (INSPRA) 50 MG tablet Take 1 tablet (50 mg total) by mouth daily. 30 tablet 11  . furosemide (LASIX) 40 MG tablet take 1 tablet by mouth twice a day 60 tablet 5  . isosorbide-hydrALAZINE (BIDIL) 20-37.5 MG tablet Take 1 tablet by mouth 3 (three) times daily. 90 tablet 5  . magnesium oxide (MAG-OX) 400 MG tablet Take 1 tablet (400 mg total) by mouth daily. 30 tablet 3  . mometasone (NASONEX) 50 MCG/ACT nasal spray 2 sprays each nostril each AM 17 g 3  . nicotine (NICODERM CQ - DOSED IN MG/24 HOURS) 14 mg/24hr patch Place 1 patch (14 mg total) onto the skin daily. 28 patch 0  . potassium chloride SA (K-DUR,KLOR-CON) 20 MEQ tablet take 1 tablet by mouth once daily 30 tablet 9  . sacubitril-valsartan (ENTRESTO) 97-103 MG Take 1 tablet by mouth 2 (two) times daily. 60 tablet 3  . sotalol (BETAPACE) 80 MG tablet Take 80 mg by mouth 2 (two) times daily.     No current facility-administered medications for this encounter.    BP (!) 106/56   Pulse 60   Wt 125.4 kg (276 lb 6 oz)   SpO2 97%   BMI 38.55 kg/m   General: NAD, overweight.  Neck: No JVD, no  thyromegaly or thyroid nodule.  Lungs: Clear to auscultation bilaterally with normal respiratory effort. CV: Nondisplaced PMI.  Heart regular S1/S2, no S3/S4, 1/6 HSM apex.  No peripheral edema.  No carotid bruit.  Normal pedal pulses.  Abdomen: Soft, nontender, no hepatosplenomegaly, no distention.  Skin: Intact without lesions or rashes.  Neurologic: Alert and oriented x 3.  Psych: Normal affect. Extremities: No clubbing or cyanosis.  HEENT: Normal.   Assessment/Plan: 1. Chronic systolic CHF: Nonischemic cardiomyopathy x years.  [employer] CRT-D device.  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  Echo in 8/19 with EF 20-25%. He is not volume  overloaded on exam.  NYHA class I-II symptoms. - Needs CPX, will arrange.  - Continue lasix 40 mg bid, BMET today.  - Continue Entresto 97/103 bid.  - Continue Coreg 25 mg bid.  - Intolerant spiro due to gynecomastia. Increase eplerenone to 50 mg daily.  BMET today and in 10 days.  - Continue Bidil 1 tab tid.   - Possible familial CMP, recommend that his twins who are in their 71s get echoes done. - Taking sotalol b/c of high DFTs.  Paced QTc acceptable on ECG today.  2. Smoking: Discussed smoking cessation again.  He is trying to quit, has nicotine patches.  3. Atrial Tachycardia: He is on sotalol, paced QTc ok today.  4. Central sleep apnea: Continue Bipap.    Followup with me in 3 months.   Marca Ancona 08/07/2018

## 2018-08-15 ENCOUNTER — Ambulatory Visit (HOSPITAL_COMMUNITY): Payer: Medicaid Other

## 2018-08-15 ENCOUNTER — Ambulatory Visit (HOSPITAL_COMMUNITY)
Admission: RE | Admit: 2018-08-15 | Discharge: 2018-08-15 | Disposition: A | Discharge status: Home / Self Care | Payer: Medicaid Other | Source: Ambulatory Visit | Attending: Cardiology | Admitting: Cardiology

## 2018-08-15 ENCOUNTER — Other Ambulatory Visit (HOSPITAL_COMMUNITY): Payer: Self-pay | Admitting: *Deleted

## 2018-08-15 DIAGNOSIS — I5022 Chronic systolic (congestive) heart failure: Secondary | ICD-10-CM | POA: Insufficient documentation

## 2018-08-15 LAB — BASIC METABOLIC PANEL
ANION GAP: 11 (ref 5–15)
BUN: 17 mg/dL (ref 6–20)
CALCIUM: 9.2 mg/dL (ref 8.9–10.3)
CO2: 25 mmol/L (ref 22–32)
Chloride: 103 mmol/L (ref 98–111)
Creatinine, Ser: 1.36 mg/dL — ABNORMAL HIGH (ref 0.61–1.24)
GFR calc Af Amer: >60 mL/min (ref >60)
GFR calc non Af Amer: 57 mL/min — ABNORMAL LOW (ref >60)
Glucose, Bld: 85 mg/dL (ref 70–99)
Potassium: 3.9 mmol/L (ref 3.5–5.1)
Sodium: 139 mmol/L (ref 135–145)

## 2018-08-18 ENCOUNTER — Telehealth (HOSPITAL_COMMUNITY): Payer: Self-pay | Admitting: *Deleted

## 2018-08-18 ENCOUNTER — Other Ambulatory Visit: Payer: Self-pay | Admitting: Internal Medicine

## 2018-08-18 NOTE — Telephone Encounter (Signed)
Result Notes for Cardiopulmonary exercise test   Notes recorded by Georgina Peer, RN on 08/18/2018 at 9:01 AM EDT Patient aware and educated on diet and exercise. Pt stated he will try and start walking some each day. ------  Notes recorded by Laurey Morale, MD on 08/17/2018 at 9:26 PM EDT Moderate HF limitation. Obesity/restrictive ventilation also play a role in decreased functional capacity.

## 2018-08-23 ENCOUNTER — Other Ambulatory Visit (HOSPITAL_COMMUNITY): Payer: Self-pay | Admitting: *Deleted

## 2018-08-23 ENCOUNTER — Telehealth: Payer: Self-pay | Admitting: Adult Health

## 2018-08-23 NOTE — Telephone Encounter (Signed)
Called patient unable to reach left message to give us a call back.

## 2018-08-24 ENCOUNTER — Telehealth (HOSPITAL_COMMUNITY): Payer: Self-pay | Admitting: Pharmacist

## 2018-08-24 NOTE — Telephone Encounter (Signed)
Called spoke with [facility] and spoke with Geoffery Spruce - Rx is under Dr Shirlee Latch and refill is ready for pick - it required PA  LMOM TCB x2 - this medication is not rx'd by this office and is ready for pick up

## 2018-08-24 NOTE — Telephone Encounter (Signed)
Entresto PA approved by [place] [employer] through 08/18/19.   Tyler Deis. Bonnye Fava, PharmD, BCPS, CPP Clinical Pharmacist Phone: [phone] 08/24/2018 10:50 AM

## 2018-08-25 NOTE — Telephone Encounter (Signed)
Left message informing patient, the medication was prescribed by Dr Shirlee Latch and refills needs to go through their office. Nothing further needed at this time.

## 2018-09-02 ENCOUNTER — Other Ambulatory Visit: Payer: Self-pay | Admitting: Physician Assistant

## 2018-09-26 ENCOUNTER — Encounter: Payer: Self-pay | Admitting: Cardiology

## 2018-09-28 ENCOUNTER — Other Ambulatory Visit (HOSPITAL_COMMUNITY): Payer: Self-pay

## 2018-09-28 MED ORDER — SACUBITRIL-VALSARTAN 97-103 MG PO TABS
1.0000 | ORAL_TABLET | Freq: Two times a day (BID) | ORAL | 5 refills | Status: DC
Start: 2018-09-28 — End: 2019-04-09

## 2018-10-03 ENCOUNTER — Other Ambulatory Visit (HOSPITAL_COMMUNITY): Payer: Self-pay

## 2018-10-03 MED ORDER — ISOSORB DINITRATE-HYDRALAZINE 20-37.5 MG PO TABS: 1.0000 | ORAL_TABLET | Freq: Three times a day (TID) | ORAL | 5 refills | Status: DC

## 2018-10-20 ENCOUNTER — Other Ambulatory Visit: Payer: Self-pay | Admitting: Internal Medicine

## 2018-10-20 ENCOUNTER — Ambulatory Visit (HOSPITAL_COMMUNITY)
Admission: RE | Admit: 2018-10-20 | Discharge: 2018-10-20 | Disposition: A | Discharge status: Home / Self Care | Payer: Medicaid Other | Source: Ambulatory Visit | Attending: Cardiology | Admitting: Cardiology

## 2018-10-20 VITALS — BP 100/62 | HR 61 | Wt 282.6 lb

## 2018-10-20 DIAGNOSIS — Z7982 Long term (current) use of aspirin: Secondary | ICD-10-CM | POA: Insufficient documentation

## 2018-10-20 DIAGNOSIS — Z95810 Presence of automatic (implantable) cardiac defibrillator: Secondary | ICD-10-CM | POA: Diagnosis not present

## 2018-10-20 DIAGNOSIS — I5022 Chronic systolic (congestive) heart failure: Secondary | ICD-10-CM

## 2018-10-20 DIAGNOSIS — F17210 Nicotine dependence, cigarettes, uncomplicated: Secondary | ICD-10-CM | POA: Insufficient documentation

## 2018-10-20 DIAGNOSIS — I471 Supraventricular tachycardia: Secondary | ICD-10-CM | POA: Diagnosis not present

## 2018-10-20 DIAGNOSIS — Z79899 Other long term (current) drug therapy: Secondary | ICD-10-CM | POA: Insufficient documentation

## 2018-10-20 DIAGNOSIS — Z8249 Family history of ischemic heart disease and other diseases of the circulatory system: Secondary | ICD-10-CM | POA: Diagnosis not present

## 2018-10-20 DIAGNOSIS — I428 Other cardiomyopathies: Secondary | ICD-10-CM | POA: Diagnosis not present

## 2018-10-20 DIAGNOSIS — I447 Left bundle-branch block, unspecified: Secondary | ICD-10-CM | POA: Insufficient documentation

## 2018-10-20 DIAGNOSIS — G4731 Primary central sleep apnea: Secondary | ICD-10-CM | POA: Diagnosis not present

## 2018-10-20 LAB — BASIC METABOLIC PANEL
ANION GAP: 7 (ref 5–15)
BUN: 19 mg/dL (ref 6–20)
CALCIUM: 9.3 mg/dL (ref 8.9–10.3)
CO2: 26 mmol/L (ref 22–32)
Chloride: 102 mmol/L (ref 98–111)
Creatinine, Ser: 1.27 mg/dL — ABNORMAL HIGH (ref 0.61–1.24)
GLUCOSE: 98 mg/dL (ref 70–99)
POTASSIUM: 4.4 mmol/L (ref 3.5–5.1)
SODIUM: 135 mmol/L (ref 135–145)

## 2018-10-20 NOTE — Telephone Encounter (Signed)
Pt pharmacy is requesting refill on medication Sotalol 80 mg, which is not prescribed by Dr. Mayford Knife, please address. Thank you.

## 2018-10-20 NOTE — Progress Notes (Signed)
Genetic testing done today via blood sample.

## 2018-10-20 NOTE — Patient Instructions (Signed)
Genetic testing was done today.  It may take up to 2 weeks for results.  You have been referred to cardiac rehab. They will contact you to discuss plan/schedule.  Labs today We will only contact you if something comes back abnormal or we need to make some changes. Otherwise no news is good news!  Your physician recommends that you schedule a follow-up appointment in: 4 months

## 2018-10-22 NOTE — Progress Notes (Signed)
Cardiology: Dr. Graciela Husbands HF Cardiology: Dr. Shirlee Latch  57 y.o. with history of nonischemic cardiomyopathy with a long history of cardiomyopathy.  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  Echo in 7/18 showed EF 20-25% with diffuse hypokinesis and dilated RV.  He has a strong family history of CHF (father, sister, half-brother).  Sleep study in 12/18 with severe central sleep apnea, now on Bipap.  Echo in 8/19 showed EF 20-25%, moderate MR, mildly decreased RV systolic function.   He returns for HF followup. He is still smoking 3-4 cigarettes/day.  He denies exertional dyspnea or chest pain.  No orthopnea/PND.  Weight is up about 6 lbs.  He does not feel like he is retaining fluid.      ECG (personally reviewed): a-paced, BiV paced with QTc 478 msec  Labs (8/18): K 4.3, creatinine 0.88 Labs (08/17/2017) K 4.1 Creatinine 1.17, BNP 114 Labs (11/18): K 3.9, creatinine 1.14 Labs (4/19): K 4.5, creatinine 1.06 Labs (6/19): K 4, creatinine 1.09 Labs (9/19): K 3.9, creatinine 1.36  PMH: 1. Central sleep apnea: Uses Bipap.  2. LBBB: s/p [employer] CRT-D.   3. 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  4. History of atrial tachycardia 5. Active smoker 6. Chronic systolic CHF: nonischemic cardiomyopathy.  Known since prior to 2005.  [employer] CRT-D.  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  - Echo (7/18): EF 20-25%, severe dilated LV, diffuse hypokinesis, severe RV dilation with low normal systolic function, moderate TR, PASP 52 mmHg.  - Gynecomastia with spironolactone.  - Echo (8/19): EF 20-25%, moderate MR, mildly decreased RV systolic function.  - CPX (9/19): peak VO2 14.4, VE/VCO2 slope 40, RER 1.08.  Moderate functional limitation due to HF and body habitus.   SH: Smokes 3-4 cigs/day, rare ETOH, occasional marijuana.   Lives with friend in [place].   FH: Father with cardiomyopathy, 1/2 brother died from CHF, sister with CHF.   ROS: All systems reviewed and negative except as per HPI.    Current Outpatient Medications  Medication Sig Dispense Refill  . aspirin 81 MG tablet Take 81 mg by mouth daily.      . carvedilol (COREG) 25 MG tablet TAKE 1 TABLET BY MOUTH TWICE A DAY WITH MEALS 180 tablet 1  . eplerenone (INSPRA) 50 MG tablet Take 1 tablet (50 mg total) by mouth daily. 30 tablet 11  . furosemide (LASIX) 40 MG tablet TAKE 1 TABLET BY MOUTH TWICE A DAY 180 tablet 1  . isosorbide-hydrALAZINE (BIDIL) 20-37.5 MG tablet Take 1 tablet by mouth 3 (three) times daily. 90 tablet 5  . magnesium oxide (MAG-OX) 400 MG tablet Take 1 tablet (400 mg total) by mouth daily. 30 tablet 3  . mometasone (NASONEX) 50 MCG/ACT nasal spray 2 sprays each nostril each AM 17 g 3  . potassium chloride SA (K-DUR,KLOR-CON) 20 MEQ tablet take 1 tablet by mouth once daily 30 tablet 9  . sacubitril-valsartan (ENTRESTO) 97-103 MG Take 1 tablet by mouth 2 (two) times daily. 60 tablet 5  . sotalol (BETAPACE) 80 MG tablet Take 80 mg by mouth 2 (two) times daily.     No current facility-administered medications for this encounter.    BP 100/62   Pulse 61   Wt 128.2 kg (282 lb 9.6 oz)   SpO2 94%   BMI 39.41 kg/m   General: NAD Neck: Thick, no JVD, no thyromegaly or thyroid nodule.  Lungs: Clear to auscultation bilaterally with normal respiratory effort. CV: Nondisplaced PMI.  Heart regular  S1/S2, no S3/S4, no murmur.  No peripheral edema.  No carotid bruit.  Normal pedal pulses.  Abdomen: Soft, nontender, no hepatosplenomegaly, no distention.  Skin: Intact without lesions or rashes.  Neurologic: Alert and oriented x 3.  Psych: Normal affect. Extremities: No clubbing or cyanosis.  HEENT: Normal.   Assessment/Plan: 1. Chronic systolic CHF: Nonischemic cardiomyopathy x years.  [employer] CRT-D device.  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  Echo in 8/19 with EF 20-25%. He is not volume overloaded on exam.  NYHA class I-II symptoms by report, but CPX in 9/19 did  appear to show moderate functional limitation from heart failure. - Continue lasix 40 mg bid, BMET today.  - Continue Entresto 97/103 bid.  - Continue Coreg 25 mg bid.  - Intolerant spiro due to gynecomastia.  Continue eplerenone 50 mg daily.   - Continue Bidil 1 tab tid.  He does not have BP room to titrate up.  - Possible familial CMP => I am going to send off genetic testing for familial cardiomyopathies.   - Taking sotalol b/c of high DFTs.  Paced QTc acceptable on ECG today.  - I am going to refer him to cardiac rehab.  2. Smoking: Discussed smoking cessation again.   3. Atrial Tachycardia: He is on sotalol, paced QTc ok today.  4. Central sleep apnea: Continue Bipap.    Followup with me in 4 months.   Marca Ancona 10/22/2018

## 2018-10-23 ENCOUNTER — Other Ambulatory Visit: Payer: Self-pay | Admitting: Internal Medicine

## 2018-10-23 NOTE — Telephone Encounter (Signed)
This is a CHF pt 

## 2018-10-23 NOTE — Telephone Encounter (Signed)
Call patient's home phone and advised her call the office back to discuss sotalol.

## 2018-10-23 NOTE — Telephone Encounter (Signed)
Dr. [name] office refilled.

## 2018-10-24 ENCOUNTER — Telehealth (HOSPITAL_COMMUNITY): Payer: Self-pay

## 2018-10-24 NOTE — Telephone Encounter (Signed)
Pt left message on service for medication refill. When called back, patient did not know name of medication. Patient asked for some time to look for medication bottle.  Attempted to call patient again as follow up, no answer. LM for patient to return call.

## 2018-10-30 ENCOUNTER — Other Ambulatory Visit (HOSPITAL_COMMUNITY): Payer: Self-pay | Admitting: Pharmacist

## 2018-10-30 DIAGNOSIS — E854 Organ-limited amyloidosis: Secondary | ICD-10-CM

## 2018-10-30 DIAGNOSIS — I43 Cardiomyopathy in diseases classified elsewhere: Principal | ICD-10-CM

## 2018-11-10 ENCOUNTER — Telehealth (HOSPITAL_COMMUNITY): Payer: Self-pay

## 2018-11-10 NOTE — Telephone Encounter (Signed)
Attempted to contact pt in regards to CR, pt was not available per a Bonita Quin who answer the phone. She stated she will have pt return our phone call.

## 2018-11-16 ENCOUNTER — Ambulatory Visit: Payer: Medicaid Other | Admitting: Genetic Counselor

## 2018-11-21 ENCOUNTER — Encounter (HOSPITAL_COMMUNITY): Payer: Self-pay

## 2018-11-21 NOTE — Telephone Encounter (Signed)
Attempted to contact pt in regards to CR, pt was not available.  Mailed letter

## 2018-11-27 NOTE — Progress Notes (Signed)
Referral Reason  Dr. Shirlee Latch has referred Spencer Robertson for a genetic consult of cardiomyopathy given his clinical presentation and family history of congestive heart failure.  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 He states that he first began experiencing shortness of breath in his 30s while walking. He tells me that he used to feel mild nausea and dizziness when he stood up. He denies having chest pains or fainting. He was found to have heart disease when he went to the ER in his 30s after a bout of excessive drinking. He began noticing swelling in his hands and legs around the age [age] and was treated with medication. He tells me that he had a pacemaker put in about 8m years ago.    Family history [name] (III.8) is the youngest of 8 children. He has a brother (III.3) and two sisters (III.5, III.7) who have congestive heart failure. His children (IV.5, IV.6) are currently in good health and notes that he does not know much about his siblings' children. He also notes a half brother (III.4) with his father who died at 15 from heart failure  There is no apparent history of heart disease in his maternal lineage and states that his mother (II.11) is now 44 and in good health.  His father (II.10) died in his 82s from heart failure. In addition, he reports 2 paternal uncles (II.2, II.3) and 3 paternal aunts (II.4-II.6) that died of heart failure. He is not aware of his paternal grandparent's medical history.  Impression  In summary, Spencer Robertson clinical presentation and family history of heart failure in his paternal lineage is indicative of a genetic condition. While Dr. [name] notes indicate that genetic testing is being performed, he seems to be confused about this. I will reach out to Dr. Shirlee Latch to confirm this and upon receipt of his genetic test  report will reinterpret the report.   Spencer Robertson, Ph.D, [facility] Clinical Molecular Geneticist

## 2018-12-07 ENCOUNTER — Telehealth (HOSPITAL_COMMUNITY): Payer: Self-pay

## 2018-12-07 NOTE — Telephone Encounter (Signed)
Called patient to see if he was interested in participating in the Cardiac Rehab Program. Patient stated yes. Patient will come in for orientation on 01/09/2019 @ 730AM and will attend the 645AM exercise class.  Mailed homework package.  Went over insurance, patient verbalized understanding.

## 2018-12-23 ENCOUNTER — Other Ambulatory Visit: Payer: Self-pay | Admitting: Physician Assistant

## 2019-01-01 ENCOUNTER — Telehealth (HOSPITAL_COMMUNITY): Payer: Self-pay | Admitting: Pharmacist

## 2019-01-04 ENCOUNTER — Telehealth (HOSPITAL_COMMUNITY): Payer: Self-pay

## 2019-01-04 NOTE — Telephone Encounter (Signed)
Genetic results faxed to Dr. Sidney Ace

## 2019-01-05 ENCOUNTER — Telehealth (HOSPITAL_COMMUNITY): Payer: Self-pay | Admitting: *Deleted

## 2019-01-08 NOTE — Telephone Encounter (Signed)
Cardiac Rehab - Pharmacy Resident Documentation   Patient unable to be reached after three call attempts. Please complete allergy verification and medication review during patient's cardiac rehab appointment.    Thank you for allowing pharmacy to be a part of this patient's care.  Jory Tanguma, PharmD PGY1 Pharmacy Resident Phone: [phone]  

## 2019-01-08 NOTE — Progress Notes (Signed)
Spencer Robertson 58 y.o. male DOB [birth year] MRN [id number]       Nutrition Screener Note  No diagnosis found. Past Medical History:  Diagnosis Date  . CHF (congestive heart failure) (HCC)   . Hyperlipidemia   . Hypertension   . Mitral regurgitation   . Nonischemic cardiomyopathy (HCC)   . Obesity   . Obesity (BMI 30-39.9) 09/01/2015  . OSA (obstructive sleep apnea)    severe  . S/P implantation of automatic cardioverter/defibrillator (AICD)    Guidant Contact H177  . Tobacco abuse    Meds reviewed.     Current Outpatient Medications (Cardiovascular):  .  carvedilol (COREG) 25 MG tablet, TAKE 1 TABLET BY MOUTH TWICE A DAY WITH MEALS .  eplerenone (INSPRA) 50 MG tablet, Take 1 tablet (50 mg total) by mouth daily. .  furosemide (LASIX) 40 MG tablet, TAKE 1 TABLET BY MOUTH TWICE A DAY .  isosorbide-hydrALAZINE (BIDIL) 20-37.5 MG tablet, Take 1 tablet by mouth 3 (three) times daily. .  sacubitril-valsartan (ENTRESTO) 97-103 MG, Take 1 tablet by mouth 2 (two) times daily. .  sotalol (BETAPACE) 80 MG tablet, TAKE 1 TABLET BY MOUTH TWICE A DAY  Current Outpatient Medications (Respiratory):  .  mometasone (NASONEX) 50 MCG/ACT nasal spray, 2 sprays each nostril each AM  Current Outpatient Medications (Analgesics):  .  aspirin 81 MG tablet, Take 81 mg by mouth daily.     Current Outpatient Medications (Other):  .  magnesium oxide (MAG-OX) 400 MG tablet, Take 1 tablet (400 mg total) by mouth daily. .  potassium chloride SA (K-DUR,KLOR-CON) 20 MEQ tablet, TAKE 1 TABLET BY MOUTH ONCE DAILY   HT: Ht Readings from Last 1 Encounters:  03/21/18 5\' 11"  (1.803 m)    WT: Wt Readings from Last 5 Encounters:  10/20/18 282 lb 9.6 oz (128.2 kg)  08/04/18 276 lb 6 oz (125.4 kg)  05/02/18 278 lb 8 oz (126.3 kg)  03/29/18 275 lb (124.7 kg)  03/21/18 281 lb (127.5 kg)     BMI = 39.21   03/21/18  Current tobacco use? Current day smoker per EMR 03/21/18       Labs:  Lipid Panel      Component Value Date/Time   CHOL  09/13/2007 0405    162        ATP III CLASSIFICATION:  <200     mg/dL   Desirable  742-595  mg/dL   Borderline High  >=638    mg/dL   High   TRIG 77 75/64/3329 0405   HDL 46 09/13/2007 0405   CHOLHDL 3.5 09/13/2007 0405   VLDL 15 09/13/2007 0405   LDLCALC (H) 09/13/2007 0405    101        Total Cholesterol/HDL:CHD Risk Coronary Heart Disease Risk Table                     Men   Women  1/2 Average Risk   3.4   3.3    No results found for: HGBA1C CBG (last 3)  No results for input(s): GLUCAP in the last 72 hours.  Nutrition Diagnosis ? Food-and nutrition-related knowledge deficit related to lack of exposure to information as related to diagnosis of: ? CVD ? CHF ? Obese  II = 35-39.9 related to excessive energy intake as evidenced by a BMI = 39.21   03/21/18  Nutrition Goal(s):  ? To be determined  Plan:  Pt to attend nutrition classes ? Nutrition I ? Nutrition II ?  Portion Distortion  Will provide client-centered nutrition education as part of interdisciplinary care.   Monitor and evaluate progress toward nutrition goal with team.  Ross Marcus, MS, RD, LDN 01/08/2019 7:25 AM

## 2019-01-09 ENCOUNTER — Inpatient Hospital Stay (HOSPITAL_COMMUNITY)
Admission: RE | Admit: 2019-01-09 | Discharge: 2019-01-09 | Disposition: A | Discharge status: Home / Self Care | Payer: Medicaid Other | Source: Ambulatory Visit

## 2019-01-15 ENCOUNTER — Ambulatory Visit (HOSPITAL_COMMUNITY): Payer: Medicaid Other

## 2019-01-17 ENCOUNTER — Ambulatory Visit (HOSPITAL_COMMUNITY): Payer: Medicaid Other

## 2019-01-19 ENCOUNTER — Ambulatory Visit (HOSPITAL_COMMUNITY): Payer: Medicaid Other

## 2019-01-22 ENCOUNTER — Ambulatory Visit (HOSPITAL_COMMUNITY): Payer: Medicaid Other

## 2019-01-24 ENCOUNTER — Other Ambulatory Visit: Payer: Self-pay | Admitting: Cardiology

## 2019-01-24 ENCOUNTER — Ambulatory Visit (HOSPITAL_COMMUNITY): Payer: Medicaid Other

## 2019-01-26 ENCOUNTER — Ambulatory Visit (HOSPITAL_COMMUNITY): Payer: Medicaid Other

## 2019-01-29 ENCOUNTER — Ambulatory Visit (HOSPITAL_COMMUNITY): Payer: Medicaid Other

## 2019-01-31 ENCOUNTER — Ambulatory Visit (HOSPITAL_COMMUNITY): Payer: Medicaid Other

## 2019-02-02 ENCOUNTER — Ambulatory Visit (HOSPITAL_COMMUNITY): Payer: Medicaid Other

## 2019-02-05 ENCOUNTER — Ambulatory Visit (HOSPITAL_COMMUNITY): Payer: Medicaid Other

## 2019-02-07 ENCOUNTER — Ambulatory Visit (HOSPITAL_COMMUNITY): Payer: Medicaid Other

## 2019-02-09 ENCOUNTER — Ambulatory Visit (HOSPITAL_COMMUNITY): Payer: Medicaid Other

## 2019-02-12 ENCOUNTER — Ambulatory Visit (HOSPITAL_COMMUNITY): Payer: Medicaid Other

## 2019-02-14 ENCOUNTER — Ambulatory Visit (HOSPITAL_COMMUNITY): Payer: Medicaid Other

## 2019-02-16 ENCOUNTER — Ambulatory Visit (HOSPITAL_COMMUNITY): Payer: Medicaid Other

## 2019-02-19 ENCOUNTER — Ambulatory Visit (HOSPITAL_COMMUNITY)
Admission: RE | Admit: 2019-02-19 | Discharge: 2019-02-19 | Disposition: A | Discharge status: Home / Self Care | Payer: Medicaid Other | Source: Ambulatory Visit | Attending: Cardiology | Admitting: Cardiology

## 2019-02-19 ENCOUNTER — Other Ambulatory Visit: Payer: Self-pay

## 2019-02-19 ENCOUNTER — Encounter (HOSPITAL_COMMUNITY): Payer: Self-pay | Admitting: Cardiology

## 2019-02-19 ENCOUNTER — Ambulatory Visit (HOSPITAL_COMMUNITY): Payer: Medicaid Other

## 2019-02-19 VITALS — BP 104/70 | HR 65 | Wt 280.8 lb

## 2019-02-19 DIAGNOSIS — Z8249 Family history of ischemic heart disease and other diseases of the circulatory system: Secondary | ICD-10-CM | POA: Diagnosis not present

## 2019-02-19 DIAGNOSIS — Z950 Presence of cardiac pacemaker: Secondary | ICD-10-CM | POA: Insufficient documentation

## 2019-02-19 DIAGNOSIS — Z7982 Long term (current) use of aspirin: Secondary | ICD-10-CM | POA: Insufficient documentation

## 2019-02-19 DIAGNOSIS — I471 Supraventricular tachycardia: Secondary | ICD-10-CM | POA: Insufficient documentation

## 2019-02-19 DIAGNOSIS — G4731 Primary central sleep apnea: Secondary | ICD-10-CM | POA: Insufficient documentation

## 2019-02-19 DIAGNOSIS — I5022 Chronic systolic (congestive) heart failure: Secondary | ICD-10-CM | POA: Diagnosis not present

## 2019-02-19 DIAGNOSIS — R9431 Abnormal electrocardiogram [ECG] [EKG]: Secondary | ICD-10-CM | POA: Diagnosis not present

## 2019-02-19 DIAGNOSIS — F17210 Nicotine dependence, cigarettes, uncomplicated: Secondary | ICD-10-CM | POA: Insufficient documentation

## 2019-02-19 DIAGNOSIS — I428 Other cardiomyopathies: Secondary | ICD-10-CM | POA: Insufficient documentation

## 2019-02-19 DIAGNOSIS — Z79899 Other long term (current) drug therapy: Secondary | ICD-10-CM | POA: Diagnosis not present

## 2019-02-19 LAB — BASIC METABOLIC PANEL
Anion gap: 6 (ref 5–15)
BUN: 23 mg/dL — ABNORMAL HIGH (ref 6–20)
CO2: 24 mmol/L (ref 22–32)
CREATININE: 1.31 mg/dL — AB (ref 0.61–1.24)
Calcium: 9.1 mg/dL (ref 8.9–10.3)
Chloride: 105 mmol/L (ref 98–111)
GFR calc Af Amer: >60 mL/min (ref >60)
GFR calc non Af Amer: >60 mL/min (ref >60)
Glucose, Bld: 98 mg/dL (ref 70–99)
Potassium: 4.5 mmol/L (ref 3.5–5.1)
Sodium: 135 mmol/L (ref 135–145)

## 2019-02-19 NOTE — Progress Notes (Signed)
Cardiology: Dr. Graciela Husbands HF Cardiology: Dr. Shirlee Latch  58 y.o. with history of nonischemic cardiomyopathy with a long history of cardiomyopathy.  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  Echo in 7/18 showed EF 20-25% with diffuse hypokinesis and dilated RV.  He has a strong family history of CHF (father, sister, half-brother).  Sleep study in 12/18 with severe central sleep apnea, now on Bipap.  Echo in 8/19 showed EF 20-25%, moderate MR, mildly decreased RV systolic function.   He returns for HF followup. He is still smoking 3-4 cigarettes/day.  Weight is down 2 lbs.  No exertional dyspnea, says he could walk a mile without problems.  No orthopnea/PND.  No lightheadedness/syncope.   ECG (personally reviewed): a-paced, BiV paced with paced QTc 550 msec  Labs (8/18): K 4.3, creatinine 0.88 Labs (08/17/2017) K 4.1 Creatinine 1.17, BNP 114 Labs (11/18): K 3.9, creatinine 1.14 Labs (4/19): K 4.5, creatinine 1.06 Labs (6/19): K 4, creatinine 1.09 Labs (9/19): K 3.9, creatinine 1.36 Labs (11/19): K 4.4, creatinine 1.27  PMH: 1. Central sleep apnea: Uses Bipap.  2. LBBB: s/p [employer] CRT-D.   3. 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  4. History of atrial tachycardia 5. Active smoker 6. Chronic systolic CHF: nonischemic cardiomyopathy.  Known since prior to 2005.  [employer] CRT-D.  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  - Echo (7/18): EF 20-25%, severe dilated LV, diffuse hypokinesis, severe RV dilation with low normal systolic function, moderate TR, PASP 52 mmHg.  - Gynecomastia with spironolactone.  - Echo (8/19): EF 20-25%, moderate MR, mildly decreased RV systolic function.  - CPX (9/19): peak VO2 14.4, VE/VCO2 slope 40, RER 1.08.  Moderate functional limitation due to HF and body habitus.  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   SH: Smokes 3-4 cigs/day, rare ETOH, occasional marijuana.   Lives with friend in [place].   FH: Father with  cardiomyopathy, 1/2 brother died from CHF, sister with CHF.   ROS: All systems reviewed and negative except as per HPI.   Current Outpatient Medications  Medication Sig Dispense Refill  . aspirin 81 MG tablet Take 81 mg by mouth daily.      . carvedilol (COREG) 25 MG tablet TAKE 1 TABLET BY MOUTH TWICE A DAY WITH MEALS 180 tablet 1  . eplerenone (INSPRA) 50 MG tablet Take 1 tablet (50 mg total) by mouth daily. 30 tablet 11  . furosemide (LASIX) 40 MG tablet TAKE 1 TABLET BY MOUTH TWICE A DAY 180 tablet 1  . isosorbide-hydrALAZINE (BIDIL) 20-37.5 MG tablet Take 1 tablet by mouth 3 (three) times daily. 90 tablet 5  . magnesium oxide (MAG-OX) 400 MG tablet Take 1 tablet (400 mg total) by mouth daily. 30 tablet 3  . mometasone (NASONEX) 50 MCG/ACT nasal spray 2 sprays each nostril each AM 17 g 3  . potassium chloride SA (K-DUR,KLOR-CON) 20 MEQ tablet TAKE 1 TABLET BY MOUTH ONCE DAILY 90 tablet 0  . sacubitril-valsartan (ENTRESTO) 97-103 MG Take 1 tablet by mouth 2 (two) times daily. 60 tablet 5  . sotalol (BETAPACE) 80 MG tablet TAKE 1 TABLET BY MOUTH TWICE DAILY 180 tablet 0   No current facility-administered medications for this encounter.    BP 104/70   Pulse 65   Wt 127.4 kg (280 lb 12.8 oz)   SpO2 94%   BMI 39.16 kg/m   General: NAD Neck: Thick, no JVD, no thyromegaly or thyroid nodule.  Lungs: Clear to auscultation bilaterally with  normal respiratory effort. CV: Nondisplaced PMI.  Heart regular S1/S2, no S3/S4, no murmur.  No peripheral edema.  No carotid bruit.  Normal pedal pulses.  Abdomen: Soft, nontender, no hepatosplenomegaly, no distention.  Skin: Intact without lesions or rashes.  Neurologic: Alert and oriented x 3.  Psych: Normal affect. Extremities: No clubbing or cyanosis.  HEENT: Normal.   Assessment/Plan: 1. Chronic systolic CHF: Nonischemic cardiomyopathy x years.  [employer] CRT-D device.  Possible familial cardiomyopathy given strong family history of  CHF.  Echo in 8/19 with EF 20-25%. He is not volume overloaded on exam.  NYHA class I-II symptoms by report, but CPX in 9/19 did appear to show moderate functional limitation from heart failure.  He saw Dr. 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  - Continue lasix 40 mg bid, BMET today.  - Continue Entresto 97/103 bid.  - Continue Coreg 25 mg bid.  - Intolerant spiro due to gynecomastia.  Continue eplerenone 50 mg daily.   - Continue Bidil 1 tab tid.  He does not have BP room to titrate up.  - I will forward the genetic testing to Dr. Jomarie Longs for help in interpreting the significance of this.   - Taking sotalol b/c of high DFTs.  Paced QTc at the upper end of acceptable on ECG today.  2. Smoking: Discussed smoking cessation again.   3. Atrial Tachycardia: He is on sotalol, paced QTc on the upper end of acceptable today.  4. Central sleep apnea: Continue Bipap.    Followup with me in 4 months.   Marca Ancona 02/19/2019

## 2019-02-19 NOTE — Patient Instructions (Signed)
Labs were done today. We will call you with any ABNORMAL results. No news is good news!  EKG was completed.  Your physician wants you to follow-up in: 4 MONTHS You will receive a reminder letter in the mail two months in advance. If you don't receive a letter, please call our office to schedule the follow-up appointment.

## 2019-02-20 ENCOUNTER — Other Ambulatory Visit: Payer: Self-pay | Admitting: Internal Medicine

## 2019-02-21 ENCOUNTER — Ambulatory Visit (HOSPITAL_COMMUNITY): Payer: Medicaid Other

## 2019-02-23 ENCOUNTER — Ambulatory Visit (HOSPITAL_COMMUNITY): Payer: Medicaid Other

## 2019-02-26 ENCOUNTER — Ambulatory Visit (HOSPITAL_COMMUNITY): Payer: Medicaid Other

## 2019-02-28 ENCOUNTER — Ambulatory Visit (HOSPITAL_COMMUNITY): Payer: Medicaid Other

## 2019-03-02 ENCOUNTER — Ambulatory Visit (HOSPITAL_COMMUNITY): Payer: Medicaid Other

## 2019-03-05 ENCOUNTER — Ambulatory Visit (HOSPITAL_COMMUNITY): Payer: Medicaid Other

## 2019-03-07 ENCOUNTER — Ambulatory Visit (HOSPITAL_COMMUNITY): Payer: Medicaid Other

## 2019-03-09 ENCOUNTER — Ambulatory Visit (HOSPITAL_COMMUNITY): Payer: Medicaid Other

## 2019-03-12 ENCOUNTER — Ambulatory Visit (HOSPITAL_COMMUNITY): Payer: Medicaid Other

## 2019-03-14 ENCOUNTER — Ambulatory Visit (HOSPITAL_COMMUNITY): Payer: Medicaid Other

## 2019-03-15 ENCOUNTER — Other Ambulatory Visit (HOSPITAL_COMMUNITY): Payer: Self-pay

## 2019-03-15 MED ORDER — FUROSEMIDE 40 MG PO TABS: 40.0000 mg | ORAL_TABLET | Freq: Two times a day (BID) | ORAL | 1 refills | Status: DC

## 2019-03-16 ENCOUNTER — Ambulatory Visit (HOSPITAL_COMMUNITY): Payer: Medicaid Other

## 2019-03-19 ENCOUNTER — Ambulatory Visit (HOSPITAL_COMMUNITY): Payer: Medicaid Other

## 2019-03-21 ENCOUNTER — Ambulatory Visit (HOSPITAL_COMMUNITY): Payer: Medicaid Other

## 2019-03-23 ENCOUNTER — Ambulatory Visit (HOSPITAL_COMMUNITY): Payer: Medicaid Other

## 2019-03-26 ENCOUNTER — Ambulatory Visit (HOSPITAL_COMMUNITY): Payer: Medicaid Other

## 2019-03-28 ENCOUNTER — Other Ambulatory Visit: Payer: Self-pay | Admitting: Internal Medicine

## 2019-03-28 ENCOUNTER — Ambulatory Visit (HOSPITAL_COMMUNITY): Payer: Medicaid Other

## 2019-03-28 MED ORDER — POTASSIUM CHLORIDE CRYS ER 20 MEQ PO TBCR: 20.0000 meq | EXTENDED_RELEASE_TABLET | Freq: Every day | ORAL | 0 refills | Status: DC

## 2019-03-30 ENCOUNTER — Ambulatory Visit (HOSPITAL_COMMUNITY): Payer: Medicaid Other

## 2019-04-02 ENCOUNTER — Ambulatory Visit (HOSPITAL_COMMUNITY): Payer: Medicaid Other

## 2019-04-04 ENCOUNTER — Ambulatory Visit (HOSPITAL_COMMUNITY): Payer: Medicaid Other

## 2019-04-06 ENCOUNTER — Ambulatory Visit (HOSPITAL_COMMUNITY): Payer: Medicaid Other

## 2019-04-06 ENCOUNTER — Other Ambulatory Visit (HOSPITAL_COMMUNITY): Payer: Self-pay | Admitting: Cardiology

## 2019-04-09 ENCOUNTER — Ambulatory Visit (HOSPITAL_COMMUNITY): Payer: Medicaid Other

## 2019-04-11 ENCOUNTER — Ambulatory Visit (HOSPITAL_COMMUNITY): Payer: Medicaid Other

## 2019-04-13 ENCOUNTER — Ambulatory Visit (HOSPITAL_COMMUNITY): Payer: Medicaid Other

## 2019-04-16 ENCOUNTER — Ambulatory Visit (HOSPITAL_COMMUNITY): Payer: Medicaid Other

## 2019-04-18 ENCOUNTER — Ambulatory Visit (HOSPITAL_COMMUNITY): Payer: Medicaid Other

## 2019-05-02 ENCOUNTER — Other Ambulatory Visit (HOSPITAL_COMMUNITY): Payer: Self-pay | Admitting: *Deleted

## 2019-05-02 ENCOUNTER — Other Ambulatory Visit: Payer: Self-pay | Admitting: Cardiology

## 2019-05-02 MED ORDER — SOTALOL HCL 80 MG PO TABS: 80.0000 mg | ORAL_TABLET | Freq: Two times a day (BID) | ORAL | 3 refills | Status: DC

## 2019-05-12 ENCOUNTER — Other Ambulatory Visit (HOSPITAL_COMMUNITY): Payer: Self-pay | Admitting: Cardiology

## 2019-05-14 ENCOUNTER — Other Ambulatory Visit (HOSPITAL_COMMUNITY): Payer: Self-pay | Admitting: *Deleted

## 2019-05-14 MED ORDER — ISOSORB DINITRATE-HYDRALAZINE 20-37.5 MG PO TABS: 1.0000 | ORAL_TABLET | Freq: Three times a day (TID) | ORAL | 6 refills | Status: DC

## 2019-05-21 ENCOUNTER — Other Ambulatory Visit: Payer: Self-pay | Admitting: Internal Medicine

## 2019-05-23 ENCOUNTER — Other Ambulatory Visit: Payer: Self-pay | Admitting: Internal Medicine

## 2019-06-23 ENCOUNTER — Other Ambulatory Visit: Payer: Self-pay | Admitting: Internal Medicine

## 2019-06-25 ENCOUNTER — Other Ambulatory Visit: Payer: Self-pay | Admitting: Internal Medicine

## 2019-06-27 ENCOUNTER — Other Ambulatory Visit (HOSPITAL_COMMUNITY): Payer: Self-pay

## 2019-06-27 MED ORDER — CARVEDILOL 25 MG PO TABS: 25.0000 mg | ORAL_TABLET | Freq: Two times a day (BID) | ORAL | 1 refills | Status: DC

## 2019-06-27 MED ORDER — POTASSIUM CHLORIDE CRYS ER 20 MEQ PO TBCR: 20.0000 meq | EXTENDED_RELEASE_TABLET | Freq: Every day | ORAL | 1 refills | Status: DC

## 2019-07-10 ENCOUNTER — Ambulatory Visit (INDEPENDENT_AMBULATORY_CARE_PROVIDER_SITE_OTHER): Payer: Medicaid Other | Admitting: *Deleted

## 2019-07-10 ENCOUNTER — Telehealth: Payer: Self-pay | Admitting: Cardiology

## 2019-07-10 DIAGNOSIS — I420 Dilated cardiomyopathy: Secondary | ICD-10-CM | POA: Diagnosis not present

## 2019-07-10 LAB — CUP PACEART REMOTE DEVICE CHECK
Battery Remaining Longevity: 18 mo
Battery Remaining Longevity: 18 mo
Battery Remaining Percentage: 34 %
Battery Remaining Percentage: 34 %
Brady Statistic RA Percent Paced: 34 %
Brady Statistic RA Percent Paced: 34 %
Brady Statistic RV Percent Paced: 98 %
Brady Statistic RV Percent Paced: 98 %
Date Time Interrogation Session: 20200801114100
Date Time Interrogation Session: 20200804125100
HighPow Impedance: 63 Ohm
HighPow Impedance: 58 Ohm
Implantable Lead Implant Date: 20051122
Implantable Lead Implant Date: 20051122
Implantable Lead Implant Date: 20051122
Implantable Lead Implant Date: 20051122
Implantable Lead Implant Date: 20051122
Implantable Lead Implant Date: 20051122
Implantable Lead Location: 753858
Implantable Lead Location: 753859
Implantable Lead Location: 753860
Implantable Lead Location: 753858
Implantable Lead Location: 753859
Implantable Lead Location: 753860
Implantable Lead Model: 158
Implantable Lead Model: 4194
Implantable Lead Model: 5076
Implantable Lead Model: 158
Implantable Lead Model: 4194
Implantable Lead Model: 5076
Implantable Lead Serial Number: 159458
Implantable Lead Serial Number: 159458
Implantable Pulse Generator Implant Date: 20110831
Implantable Pulse Generator Implant Date: 20110831
Lead Channel Impedance Value: 485 Ohm
Lead Channel Impedance Value: 533 Ohm
Lead Channel Impedance Value: 771 Ohm
Lead Channel Impedance Value: 511 Ohm
Lead Channel Impedance Value: 518 Ohm
Lead Channel Impedance Value: 760 Ohm
Lead Channel Pacing Threshold Amplitude: 0.8 V
Lead Channel Pacing Threshold Amplitude: 0.9 V
Lead Channel Pacing Threshold Amplitude: 1.1 V
Lead Channel Pacing Threshold Amplitude: 0.8 V
Lead Channel Pacing Threshold Amplitude: 0.9 V
Lead Channel Pacing Threshold Amplitude: 1.1 V
Lead Channel Pacing Threshold Pulse Width: 0.4 ms
Lead Channel Pacing Threshold Pulse Width: 0.4 ms
Lead Channel Pacing Threshold Pulse Width: 0.4 ms
Lead Channel Pacing Threshold Pulse Width: 0.4 ms
Lead Channel Pacing Threshold Pulse Width: 0.4 ms
Lead Channel Pacing Threshold Pulse Width: 0.4 ms
Lead Channel Setting Pacing Amplitude: 2 V
Lead Channel Setting Pacing Amplitude: 2 V
Lead Channel Setting Pacing Amplitude: 2.4 V
Lead Channel Setting Pacing Amplitude: 2 V
Lead Channel Setting Pacing Amplitude: 2 V
Lead Channel Setting Pacing Amplitude: 2.4 V
Lead Channel Setting Pacing Pulse Width: 0.4 ms
Lead Channel Setting Pacing Pulse Width: 0.4 ms
Lead Channel Setting Pacing Pulse Width: 0.4 ms
Lead Channel Setting Pacing Pulse Width: 0.4 ms
Lead Channel Setting Sensing Sensitivity: 0.5 mV
Lead Channel Setting Sensing Sensitivity: 1 mV
Lead Channel Setting Sensing Sensitivity: 0.5 mV
Lead Channel Setting Sensing Sensitivity: 1 mV
Pulse Gen Serial Number: 480425
Pulse Gen Serial Number: 480425

## 2019-07-10 NOTE — Telephone Encounter (Signed)
Patient came to lobby and stated that someone called him b/c his home monitor is not working. I informed nurse to tell pt to go home and call my direct number and we will try and trouble shoot the monitor. Pt called back and I asked him if he was sleeping w/in 4-6 ft of his home monitor every night he stated that he was, he stated that he believes the monitor is plugged in and that it uses a cell adapter. I instructed pt to go home to call back and we will try and help him trouble shoot his monitor. Pt verbalized understanding.

## 2019-07-10 NOTE — Telephone Encounter (Signed)
Transmission received and scheduled  

## 2019-07-10 NOTE — Telephone Encounter (Signed)
Spoke w/ pt and attempted to help him trouble shoot his monitor. 2 failed attempts. 1 yellow light on the right hand side. Instructed pt to call tech support for further help trouble shooting the monitor.

## 2019-07-13 ENCOUNTER — Telehealth: Payer: Self-pay

## 2019-07-13 NOTE — Telephone Encounter (Signed)
Pt called to see if we received his transmission for this month and we did.

## 2019-07-17 ENCOUNTER — Encounter: Payer: Self-pay | Admitting: Cardiology

## 2019-07-17 NOTE — Progress Notes (Signed)
Remote ICD transmission.   

## 2019-07-30 ENCOUNTER — Encounter (HOSPITAL_COMMUNITY): Payer: Self-pay | Admitting: Cardiology

## 2019-07-30 ENCOUNTER — Ambulatory Visit (HOSPITAL_COMMUNITY)
Admission: RE | Admit: 2019-07-30 | Discharge: 2019-07-30 | Disposition: A | Discharge status: Home / Self Care | Payer: Medicaid Other | Source: Ambulatory Visit | Attending: Cardiology | Admitting: Cardiology

## 2019-07-30 ENCOUNTER — Other Ambulatory Visit: Payer: Self-pay

## 2019-07-30 VITALS — BP 114/54 | HR 61 | Wt 272.4 lb

## 2019-07-30 DIAGNOSIS — F17210 Nicotine dependence, cigarettes, uncomplicated: Secondary | ICD-10-CM | POA: Diagnosis not present

## 2019-07-30 DIAGNOSIS — I428 Other cardiomyopathies: Secondary | ICD-10-CM | POA: Insufficient documentation

## 2019-07-30 DIAGNOSIS — G4731 Primary central sleep apnea: Secondary | ICD-10-CM | POA: Diagnosis not present

## 2019-07-30 DIAGNOSIS — Z79899 Other long term (current) drug therapy: Secondary | ICD-10-CM | POA: Insufficient documentation

## 2019-07-30 DIAGNOSIS — Z8249 Family history of ischemic heart disease and other diseases of the circulatory system: Secondary | ICD-10-CM | POA: Diagnosis not present

## 2019-07-30 DIAGNOSIS — Z7982 Long term (current) use of aspirin: Secondary | ICD-10-CM | POA: Diagnosis not present

## 2019-07-30 DIAGNOSIS — Z7901 Long term (current) use of anticoagulants: Secondary | ICD-10-CM | POA: Diagnosis not present

## 2019-07-30 DIAGNOSIS — I471 Supraventricular tachycardia: Secondary | ICD-10-CM | POA: Insufficient documentation

## 2019-07-30 DIAGNOSIS — N62 Hypertrophy of breast: Secondary | ICD-10-CM | POA: Diagnosis not present

## 2019-07-30 DIAGNOSIS — I5022 Chronic systolic (congestive) heart failure: Secondary | ICD-10-CM | POA: Diagnosis present

## 2019-07-30 LAB — BASIC METABOLIC PANEL
Anion gap: 9 (ref 5–15)
BUN: 27 mg/dL — ABNORMAL HIGH (ref 6–20)
CO2: 23 mmol/L (ref 22–32)
Calcium: 9.3 mg/dL (ref 8.9–10.3)
Chloride: 103 mmol/L (ref 98–111)
Creatinine, Ser: 1.34 mg/dL — ABNORMAL HIGH (ref 0.61–1.24)
GFR calc Af Amer: >60 mL/min (ref >60)
GFR calc non Af Amer: 58 mL/min — ABNORMAL LOW (ref >60)
Glucose, Bld: 105 mg/dL — ABNORMAL HIGH (ref 70–99)
Potassium: 4.4 mmol/L (ref 3.5–5.1)
Sodium: 135 mmol/L (ref 135–145)

## 2019-07-30 MED ORDER — BIDIL 20-37.5 MG PO TABS: 1.5000 | ORAL_TABLET | Freq: Three times a day (TID) | ORAL | 3 refills | Status: DC

## 2019-07-30 NOTE — Progress Notes (Signed)
Cardiology: Dr. Caryl Comes HF Cardiology: Dr. Aundra Dubin  58 y.o. with history of nonischemic cardiomyopathy with a long history of cardiomyopathy.  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  Echo in 7/18 showed EF 20-25% with diffuse hypokinesis and dilated RV.  He has a strong family history of CHF (father, sister, half-brother).  Sleep study in 12/18 with severe central sleep apnea, now on Bipap.  Echo in 8/19 showed EF 20-25%, moderate MR, mildly decreased RV systolic function.   He returns for HF followup. He is still smoking 3-4 cigarettes/day.  Weight is down 8 lbs.  Doing well generally.  No lightheadedness.  No orthopnea/PND.  No significant exertional dyspnea.  Able to walk up a flight of stairs without problems.    He left before his ECG was done today.   Labs (8/18): K 4.3, creatinine 0.88 Labs (08/17/2017) K 4.1 Creatinine 1.17, BNP 114 Labs (11/18): K 3.9, creatinine 1.14 Labs (4/19): K 4.5, creatinine 1.06 Labs (6/19): K 4, creatinine 1.09 Labs (9/19): K 3.9, creatinine 1.36 Labs (11/19): K 4.4, creatinine 1.27 Labs (3/20): K 4.5, creatinine 1.31  PMH: 1. Central sleep apnea: Uses Bipap.  2. LBBB: s/p [employer] CRT-D.   3. 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  4. History of atrial tachycardia 5. Active smoker 6. Chronic systolic CHF: nonischemic cardiomyopathy.  Known since prior to 2005.  [place] CRT-D.  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  - Echo (7/18): EF 20-25%, severe dilated LV, diffuse hypokinesis, severe RV dilation with low normal systolic function, moderate TR, PASP 52 mmHg.  - Gynecomastia with spironolactone.  - Echo (8/19): EF 20-25%, moderate MR, mildly decreased RV systolic function.  - CPX (9/19): peak VO2 14.4, VE/VCO2 slope 40, RER 1.08.  Moderate functional limitation due to HF and body habitus.  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   SH: Smokes 3-4 cigs/day, rare ETOH, occasional marijuana.   Lives with friend in  [place].   FH: Father with cardiomyopathy, 1/2 brother died from CHF, sister with CHF.   ROS: All systems reviewed and negative except as per HPI.   Current Outpatient Medications  Medication Sig Dispense Refill  . aspirin 81 MG tablet Take 81 mg by mouth daily.      . carvedilol (COREG) 25 MG tablet Take 1 tablet (25 mg total) by mouth 2 (two) times daily with a meal. 180 tablet 1  . ENTRESTO 97-103 MG TAKE 1 TABLET BY MOUTH TWICE DAILY 180 tablet 3  . eplerenone (INSPRA) 50 MG tablet Take 1 tablet (50 mg total) by mouth daily. 30 tablet 11  . furosemide (LASIX) 40 MG tablet Take 1 tablet (40 mg total) by mouth 2 (two) times daily. 180 tablet 1  . isosorbide-hydrALAZINE (BIDIL) 20-37.5 MG tablet Take 1.5 tablets by mouth 3 (three) times daily. 405 tablet 3  . magnesium oxide (MAG-OX) 400 MG tablet Take 1 tablet (400 mg total) by mouth daily. 30 tablet 3  . mometasone (NASONEX) 50 MCG/ACT nasal spray 2 sprays each nostril each AM 17 g 3  . potassium chloride SA (K-DUR) 20 MEQ tablet Take 1 tablet (20 mEq total) by mouth daily. 90 tablet 1  . sotalol (BETAPACE) 80 MG tablet Take 1 tablet (80 mg total) by mouth 2 (two) times daily. 180 tablet 3   No current facility-administered medications for this encounter.    BP (!) 114/54   Pulse 61   Wt 123.6 kg (272 lb 6.4 oz)  SpO2 98%   BMI 37.99 kg/m   General: NAD Neck: Thick, no JVD, no thyromegaly or thyroid nodule.  Lungs: Clear to auscultation bilaterally with normal respiratory effort. CV: Nondisplaced PMI.  Heart regular S1/S2, no S3/S4, no murmur.  No peripheral edema.  No carotid bruit.  Normal pedal pulses.  Abdomen: Soft, nontender, no hepatosplenomegaly, no distention.  Skin: Intact without lesions or rashes.  Neurologic: Alert and oriented x 3.  Psych: Normal affect. Extremities: No clubbing or cyanosis.  HEENT: Normal.   Assessment/Plan: 1. Chronic systolic CHF: Nonischemic cardiomyopathy x years.  [employer]  CRT-D device.  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  Echo in 8/19 with EF 20-25%. He is not volume overloaded on exam.  CPX in 9/19 showed moderate functional limitation from heart failure.  He saw Dr. 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  Today, NYHA class II symptoms.  He is not volume overloaded.  - Continue lasix 40 mg bid, BMET today.  - Continue Entresto 97/103 bid.  - Continue Coreg 25 mg bid.  - Intolerant spiro due to gynecomastia.  Continue eplerenone 50 mg daily.   - Increase Bidil to 1.5 tabs tid.  - Taking sotalol b/c of high DFTs.  Left before ECG was done, will need at next appt.   2. Smoking: Discussed smoking cessation again.   3. Atrial Tachycardia: He is on sotalol.  4. Central sleep apnea: Continue Bipap.    Followup with me in 4 months.   Marca Ancona 07/30/2019

## 2019-07-30 NOTE — Patient Instructions (Signed)
Labs were done today. We will call you with any ABNORMAL results. No news is good news!  EKG was completed.   INCREASE Bidil to 1.5 tablets THREE TIMES A DAY.  Your physician has requested that you have an echocardiogram. Echocardiography is a painless test that uses sound waves to create images of your heart. It provides your doctor with information about the size and shape of your heart and how well your heart's chambers and valves are working. This procedure takes approximately one hour. There are no restrictions for this procedure.  Your physician recommends that you schedule a follow-up appointment in: 3 months.  At the [facility], you and your health needs are our priority. As part of our continuing mission to provide you with exceptional heart care, we have created designated Provider Care Teams. These Care Teams include your primary Cardiologist (physician) and Advanced Practice Providers (APPs- Physician Assistants and Nurse Practitioners) who all work together to provide you with the care you need, when you need it.   You may see any of the following providers on your designated Care Team at your next follow up: Marland Kitchen Dr Glori Bickers . Dr Loralie Champagne . Darrick Grinder, NP   Please be sure to bring in all your medications bottles to every appointment.

## 2019-08-22 ENCOUNTER — Other Ambulatory Visit (HOSPITAL_COMMUNITY): Payer: Self-pay

## 2019-08-22 MED ORDER — EPLERENONE 50 MG PO TABS: 50.0000 mg | ORAL_TABLET | Freq: Every day | ORAL | 11 refills | Status: DC

## 2019-08-30 ENCOUNTER — Encounter: Payer: Self-pay | Admitting: Internal Medicine

## 2019-09-11 ENCOUNTER — Telehealth (HOSPITAL_COMMUNITY): Payer: Self-pay | Admitting: Pharmacy Technician

## 2019-09-11 NOTE — Telephone Encounter (Signed)
Patient Advocate Encounter   Received notification from Medicaid that prior authorization for Spencer Robertson is required.   PA submitted on [employer] [id number] W Status is pending   Will continue to follow.  Charlann Boxer, CPhT

## 2019-09-12 ENCOUNTER — Other Ambulatory Visit (HOSPITAL_COMMUNITY): Payer: Self-pay

## 2019-09-12 MED ORDER — ENTRESTO 97-103 MG PO TABS: 1.0000 | ORAL_TABLET | Freq: Two times a day (BID) | ORAL | 3 refills | Status: DC

## 2019-09-14 NOTE — Telephone Encounter (Signed)
Advanced Heart Failure Patient Advocate Encounter  Prior Authorization for Delene Loll 97-103mg  has been approved.    PA# [id number] Effective dates: 09/11/2019 through 09/05/2020  Patients co-pay is $3.00   No need for refill at this time.  Charlann Boxer, CPhT

## 2019-09-17 ENCOUNTER — Other Ambulatory Visit (HOSPITAL_COMMUNITY): Payer: Self-pay

## 2019-09-17 MED ORDER — FUROSEMIDE 40 MG PO TABS: 40.0000 mg | ORAL_TABLET | Freq: Two times a day (BID) | ORAL | 1 refills | Status: DC

## 2019-10-08 LAB — CUP PACEART REMOTE DEVICE CHECK
Battery Remaining Longevity: 18 mo
Battery Remaining Percentage: 28 %
Brady Statistic RA Percent Paced: 34 %
Brady Statistic RV Percent Paced: 98 %
Date Time Interrogation Session: 20201102074000
HighPow Impedance: 60 Ohm
Implantable Lead Implant Date: 20051122
Implantable Lead Implant Date: 20051122
Implantable Lead Implant Date: 20051122
Implantable Lead Location: 753858
Implantable Lead Location: 753859
Implantable Lead Location: 753860
Implantable Lead Model: 158
Implantable Lead Model: 4194
Implantable Lead Model: 5076
Implantable Lead Serial Number: 159458
Implantable Pulse Generator Implant Date: 20110831
Lead Channel Impedance Value: 467 Ohm
Lead Channel Impedance Value: 540 Ohm
Lead Channel Impedance Value: 749 Ohm
Lead Channel Pacing Threshold Amplitude: 0.8 V
Lead Channel Pacing Threshold Amplitude: 0.9 V
Lead Channel Pacing Threshold Amplitude: 1.1 V
Lead Channel Pacing Threshold Pulse Width: 0.4 ms
Lead Channel Pacing Threshold Pulse Width: 0.4 ms
Lead Channel Pacing Threshold Pulse Width: 0.4 ms
Lead Channel Setting Pacing Amplitude: 2 V
Lead Channel Setting Pacing Amplitude: 2 V
Lead Channel Setting Pacing Amplitude: 2.4 V
Lead Channel Setting Pacing Pulse Width: 0.4 ms
Lead Channel Setting Pacing Pulse Width: 0.4 ms
Lead Channel Setting Sensing Sensitivity: 0.5 mV
Lead Channel Setting Sensing Sensitivity: 1 mV
Pulse Gen Serial Number: 480425

## 2019-10-09 ENCOUNTER — Ambulatory Visit (INDEPENDENT_AMBULATORY_CARE_PROVIDER_SITE_OTHER): Payer: Medicaid Other | Admitting: *Deleted

## 2019-10-09 DIAGNOSIS — I5022 Chronic systolic (congestive) heart failure: Secondary | ICD-10-CM

## 2019-10-22 ENCOUNTER — Other Ambulatory Visit: Payer: Self-pay

## 2019-10-22 ENCOUNTER — Ambulatory Visit (HOSPITAL_COMMUNITY)
Admission: RE | Admit: 2019-10-22 | Discharge: 2019-10-22 | Disposition: A | Discharge status: Home / Self Care | Payer: Medicaid Other | Source: Ambulatory Visit | Attending: Cardiology | Admitting: Cardiology

## 2019-10-22 ENCOUNTER — Encounter: Payer: Medicaid Other | Admitting: Internal Medicine

## 2019-10-22 DIAGNOSIS — I5032 Chronic diastolic (congestive) heart failure: Secondary | ICD-10-CM | POA: Diagnosis present

## 2019-10-22 DIAGNOSIS — I429 Cardiomyopathy, unspecified: Secondary | ICD-10-CM | POA: Insufficient documentation

## 2019-10-22 DIAGNOSIS — G4730 Sleep apnea, unspecified: Secondary | ICD-10-CM | POA: Diagnosis not present

## 2019-10-22 DIAGNOSIS — I110 Hypertensive heart disease with heart failure: Secondary | ICD-10-CM | POA: Insufficient documentation

## 2019-10-22 DIAGNOSIS — F17200 Nicotine dependence, unspecified, uncomplicated: Secondary | ICD-10-CM | POA: Diagnosis not present

## 2019-10-22 DIAGNOSIS — I5022 Chronic systolic (congestive) heart failure: Secondary | ICD-10-CM | POA: Diagnosis not present

## 2019-10-22 DIAGNOSIS — E785 Hyperlipidemia, unspecified: Secondary | ICD-10-CM | POA: Diagnosis not present

## 2019-10-22 DIAGNOSIS — Z95810 Presence of automatic (implantable) cardiac defibrillator: Secondary | ICD-10-CM | POA: Diagnosis not present

## 2019-10-22 NOTE — Progress Notes (Signed)
  Echocardiogram 2D Echocardiogram has been performed.  Spencer Robertson 10/22/2019, 11:36 AM

## 2019-10-22 NOTE — Progress Notes (Deleted)
[name]      Patient Care Team: Patient, No Pcp Per as PCP - General (General Practice)   HPI  Spencer Robertson is a 58 y.o. male is seen today in followup for nonischemic cardiomyopathy with congestive heart failure and left bundle branch block, and status post CRT-D implantation originally in November 2005 with change out September 2011. The procedure was complicated by high DFT and he was started on sotalol with repeat testing demonstrating adequate defibrillation margins.      DATE TEST EF   7/18 Echo   20-25 % Severe RV dilitation  8/19 Echo   20-25 %            Past Medical History:  Diagnosis Date  . CHF (congestive heart failure) ([place])   . Hyperlipidemia   . Hypertension   . Mitral regurgitation   . Nonischemic cardiomyopathy ([place])   . Obesity   . Obesity (BMI 30-39.9) 09/01/2015  . OSA (obstructive sleep apnea)    severe  . S/P implantation of automatic cardioverter/defibrillator (AICD)    Guidant Contact H177  . Tobacco abuse     Past Surgical History:  Procedure Laterality Date  . CARDIAC DEFIBRILLATOR PLACEMENT     Guidant Contak H177 device     Current Outpatient Medications  Medication Sig Dispense Refill  . aspirin 81 MG tablet Take 81 mg by mouth daily.      . carvedilol (COREG) 25 MG tablet Take 1 tablet (25 mg total) by mouth 2 (two) times daily with a meal. 180 tablet 1  . eplerenone (INSPRA) 50 MG tablet Take 1 tablet (50 mg total) by mouth daily. 30 tablet 11  . furosemide (LASIX) 40 MG tablet Take 1 tablet (40 mg total) by mouth 2 (two) times daily. 180 tablet 1  . isosorbide-hydrALAZINE (BIDIL) 20-37.5 MG tablet Take 1.5 tablets by mouth 3 (three) times daily. 405 tablet 3  . magnesium oxide (MAG-OX) 400 MG tablet Take 1 tablet (400 mg total) by mouth daily. 30 tablet 3  . mometasone (NASONEX) 50 MCG/ACT nasal spray 2 sprays each nostril each AM 17 g 3  . potassium chloride SA (K-DUR) 20 MEQ tablet Take 1 tablet (20 mEq total) by mouth  daily. 90 tablet 1  . sacubitril-valsartan (ENTRESTO) 97-103 MG Take 1 tablet by mouth 2 (two) times daily. 180 tablet 3  . sotalol (BETAPACE) 80 MG tablet Take 1 tablet (80 mg total) by mouth 2 (two) times daily. 180 tablet 3   No current facility-administered medications for this visit.     Allergies  Allergen Reactions  . Spironolactone Other (See Comments)    gynecomastia    Review of Systems negative except from HPI and PMH  Physical Exam There were no vitals taken for this visit. Well developed and well nourished in no acute distress HENT normal E scleral and icterus clear Neck Supple JVP unable to discern  carotids brisk and full Device pocket well healed; without hematoma or erythema.  There is no tethering .   Clear to ausculation  Regular rate and rhythm, no murmurs gallops or rub Soft with active bowel sounds No clubbing cyanosis no  Edema Alert and oriented, grossly normal motor and sensory function Skin Warm and Dry  ECG demonstrates P. synchronous pacing with a  Negative QRS in lead 1 negative QRS in lead V1. Reprogramming with a -20 ms LV offset resulted in a positive deflection in lead V1 Assessment and  Plan  NICM  CHF chronic systolic  Hypertension  Morbid obesity  Sleep apnea  Atrial tachycardia  Cigarette abuse  disucssed  He continues to smoke  Have referred to [employer] for state program to assist   Nonsustained ventricular tachycardia  Implantable defibrillator-CRT-[employer]  The patient's device was interrogated.  The information was reviewed. N Device was reprogramed to increase PVARP and lenghten minimal AV delay

## 2019-10-23 ENCOUNTER — Ambulatory Visit (INDEPENDENT_AMBULATORY_CARE_PROVIDER_SITE_OTHER): Payer: Medicaid Other | Admitting: Internal Medicine

## 2019-10-23 ENCOUNTER — Encounter: Payer: Self-pay | Admitting: Internal Medicine

## 2019-10-23 VITALS — BP 100/62 | HR 60 | Ht 71.0 in | Wt 278.0 lb

## 2019-10-23 DIAGNOSIS — I420 Dilated cardiomyopathy: Secondary | ICD-10-CM | POA: Diagnosis not present

## 2019-10-23 DIAGNOSIS — I5022 Chronic systolic (congestive) heart failure: Secondary | ICD-10-CM | POA: Diagnosis not present

## 2019-10-23 DIAGNOSIS — Z95810 Presence of automatic (implantable) cardiac defibrillator: Secondary | ICD-10-CM | POA: Diagnosis not present

## 2019-10-23 LAB — CUP PACEART INCLINIC DEVICE CHECK
Brady Statistic RA Percent Paced: 34 %
Brady Statistic RV Percent Paced: 98 %
Date Time Interrogation Session: 20201117050000
HighPow Impedance: 44 Ohm
HighPow Impedance: 65 Ohm
Implantable Lead Implant Date: 20051122
Implantable Lead Implant Date: 20051122
Implantable Lead Implant Date: 20051122
Implantable Lead Location: 753858
Implantable Lead Location: 753859
Implantable Lead Location: 753860
Implantable Lead Model: 158
Implantable Lead Model: 4194
Implantable Lead Model: 5076
Implantable Lead Serial Number: 159458
Implantable Pulse Generator Implant Date: 20110831
Lead Channel Impedance Value: 493 Ohm
Lead Channel Impedance Value: 532 Ohm
Lead Channel Impedance Value: 765 Ohm
Lead Channel Pacing Threshold Amplitude: 0.9 V
Lead Channel Pacing Threshold Amplitude: 1 V
Lead Channel Pacing Threshold Amplitude: 1.2 V
Lead Channel Pacing Threshold Pulse Width: 0.4 ms
Lead Channel Pacing Threshold Pulse Width: 0.4 ms
Lead Channel Pacing Threshold Pulse Width: 0.4 ms
Lead Channel Sensing Intrinsic Amplitude: 25 mV
Lead Channel Sensing Intrinsic Amplitude: 4.8 mV
Lead Channel Sensing Intrinsic Amplitude: 9.9 mV
Lead Channel Setting Pacing Amplitude: 2 V
Lead Channel Setting Pacing Amplitude: 2 V
Lead Channel Setting Pacing Amplitude: 2.4 V
Lead Channel Setting Pacing Pulse Width: 0.4 ms
Lead Channel Setting Pacing Pulse Width: 0.4 ms
Lead Channel Setting Sensing Sensitivity: 0.5 mV
Lead Channel Setting Sensing Sensitivity: 1 mV
Pulse Gen Serial Number: 480425

## 2019-10-23 NOTE — Progress Notes (Signed)
[name]      Patient Care Team: Patient, No Pcp Per as PCP - General (General Practice)   HPI  Spencer Robertson is a 58 y.o. male is seen today in followup for nonischemic cardiomyopathy with congestive heart failure and left bundle branch block, and status post CRT-D implantation originally in November 2005 with change out September 2011. The procedure was complicated by high DFT and he was started on sotalol with repeat testing demonstrating adequate defibrillation margins.   The patient denies chest pain, shortness of breath, nocturnal dyspnea, orthopnea or peripheral edema.  There have been no palpitations, lightheadedness or syncope.    He is compliant with his CPAP.  There is an issue with the mask.  Notes were reviewed from his visit with Dr. Golden Hurter 4/19.   Date Cr K Hgb  6/18    16.8  8/20/  1.34 4.4       Past Medical History:  Diagnosis Date  . CHF (congestive heart failure) ([place])   . Hyperlipidemia   . Hypertension   . Mitral regurgitation   . Nonischemic cardiomyopathy ([place])   . Obesity   . Obesity (BMI 30-39.9) 09/01/2015  . OSA (obstructive sleep apnea)    severe  . S/P implantation of automatic cardioverter/defibrillator (AICD)    Guidant Contact H177  . Tobacco abuse     Past Surgical History:  Procedure Laterality Date  . CARDIAC DEFIBRILLATOR PLACEMENT     Guidant Contak H177 device     Current Outpatient Medications  Medication Sig Dispense Refill  . aspirin 81 MG tablet Take 81 mg by mouth daily.      . carvedilol (COREG) 25 MG tablet Take 1 tablet (25 mg total) by mouth 2 (two) times daily with a meal. 180 tablet 1  . eplerenone (INSPRA) 50 MG tablet Take 1 tablet (50 mg total) by mouth daily. 30 tablet 11  . furosemide (LASIX) 40 MG tablet Take 1 tablet (40 mg total) by mouth 2 (two) times daily. 180 tablet 1  . isosorbide-hydrALAZINE (BIDIL) 20-37.5 MG tablet Take 1.5 tablets by mouth 3 (three) times daily. 405 tablet 3  . magnesium  oxide (MAG-OX) 400 MG tablet Take 1 tablet (400 mg total) by mouth daily. 30 tablet 3  . mometasone (NASONEX) 50 MCG/ACT nasal spray 2 sprays each nostril each AM 17 g 3  . potassium chloride SA (K-DUR) 20 MEQ tablet Take 1 tablet (20 mEq total) by mouth daily. 90 tablet 1  . sacubitril-valsartan (ENTRESTO) 97-103 MG Take 1 tablet by mouth 2 (two) times daily. 180 tablet 3  . sotalol (BETAPACE) 80 MG tablet Take 1 tablet (80 mg total) by mouth 2 (two) times daily. 180 tablet 3   No current facility-administered medications for this visit.     Allergies  Allergen Reactions  . Spironolactone Other (See Comments)    gynecomastia    Review of Systems negative except from HPI and PMH  Physical Exam BP 100/62   Pulse 60   Ht 5\' 11"  (1.803 m)   Wt 278 lb (126.1 kg)   SpO2 97%   BMI 38.77 kg/m  Well developed and well nourished in no acute distress HENT normal Neck supple with JVP-flat Clear Device pocket well healed; without hematoma or erythema.  There is no tethering  Regular rate and rhythm, no   murmur Abd-soft with active BS No Clubbing cyanosis   edema Skin-warm and dry A & Oriented  Grossly normal sensory and motor function  ECG sinus rhythm at 60 with P synchronous pacing  Interval 16/18/52 QRS RS in lead V1 and QR in lead I  Assessment and  Plan  NICM  CHF chronic systolic  Hypertension  Morbid obesity  Sleep apnea  Atrial tachycardia   Nonsustained ventricular tachycardia  Implantable defibrillator-CRT-[employer]  The patient's device was interrogated.  The information was reviewed. No changes were made in the programming.      Euvolemic continue current meds  Cardiomyopathy meds followed by heart failure clinic  Blood pressure reasonably controlled  No interval ventricular tachycardia  Have reached out to heart failure clinic for magnesium check on his sotalol.  He also has some degree of polycythemia which I suspect may be related to his  sleep apnea.  This may be improved we will check a CBC.

## 2019-10-23 NOTE — Patient Instructions (Signed)
Medication Instructions:   *If you need a refill on your cardiac medications before your next appointment, please call your pharmacy*  Lab Work:  If you have labs (blood work) drawn today and your tests are completely normal, you will receive your results only by: . MyChart Message (if you have MyChart) OR . A paper copy in the mail If you have any lab test that is abnormal or we need to change your treatment, we will call you to review the results.  Testing/Procedures:   Follow-Up: At [facility], you and your health needs are our priority.  As part of our continuing mission to provide you with exceptional heart care, we have created designated Provider Care Teams.  These Care Teams include your primary Cardiologist (physician) and Advanced Practice Providers (APPs -  Physician Assistants and Nurse Practitioners) who all work together to provide you with the care you need, when you need it.  Your next appointment:   1 year(s)  The format for your next appointment:   Either In Person or Virtual  Provider:   Steven Klein, MD  Other Instructions   

## 2019-10-24 ENCOUNTER — Telehealth (HOSPITAL_COMMUNITY): Payer: Self-pay

## 2019-10-24 NOTE — Telephone Encounter (Signed)
LM for patient at home and mobile number with relative.. pt has an appt next week with Dr Aundra Dubin.

## 2019-10-24 NOTE — Telephone Encounter (Signed)
-----   Message from Larey Dresser, MD sent at 10/22/2019 10:40 PM EST ----- EF remains low, 25-30%.

## 2019-10-24 NOTE — Telephone Encounter (Signed)
Patient returned call and is aware of echo results voiced understanding

## 2019-10-25 ENCOUNTER — Telehealth: Payer: Self-pay | Admitting: *Deleted

## 2019-10-25 NOTE — Telephone Encounter (Signed)
Called patient lvm that his appointment is scheduled for 1/14/ 21 3:20.

## 2019-10-26 NOTE — Addendum Note (Signed)
Addended by: Rose Phi on: 10/26/2019 04:41 PM   Modules accepted: Orders

## 2019-10-29 ENCOUNTER — Telehealth (HOSPITAL_COMMUNITY): Payer: Self-pay | Admitting: Pharmacist

## 2019-10-29 ENCOUNTER — Other Ambulatory Visit: Payer: Self-pay

## 2019-10-29 ENCOUNTER — Ambulatory Visit (HOSPITAL_COMMUNITY)
Admission: RE | Admit: 2019-10-29 | Discharge: 2019-10-29 | Disposition: A | Discharge status: Home / Self Care | Payer: Medicaid Other | Source: Ambulatory Visit | Attending: Cardiology | Admitting: Cardiology

## 2019-10-29 VITALS — BP 101/55 | HR 64 | Wt 277.0 lb

## 2019-10-29 DIAGNOSIS — I428 Other cardiomyopathies: Secondary | ICD-10-CM | POA: Insufficient documentation

## 2019-10-29 DIAGNOSIS — Z7982 Long term (current) use of aspirin: Secondary | ICD-10-CM | POA: Diagnosis not present

## 2019-10-29 DIAGNOSIS — G4731 Primary central sleep apnea: Secondary | ICD-10-CM | POA: Diagnosis not present

## 2019-10-29 DIAGNOSIS — Z95810 Presence of automatic (implantable) cardiac defibrillator: Secondary | ICD-10-CM | POA: Insufficient documentation

## 2019-10-29 DIAGNOSIS — Z7984 Long term (current) use of oral hypoglycemic drugs: Secondary | ICD-10-CM | POA: Diagnosis not present

## 2019-10-29 DIAGNOSIS — I5022 Chronic systolic (congestive) heart failure: Secondary | ICD-10-CM | POA: Diagnosis present

## 2019-10-29 DIAGNOSIS — Z7951 Long term (current) use of inhaled steroids: Secondary | ICD-10-CM | POA: Insufficient documentation

## 2019-10-29 DIAGNOSIS — F17210 Nicotine dependence, cigarettes, uncomplicated: Secondary | ICD-10-CM | POA: Insufficient documentation

## 2019-10-29 DIAGNOSIS — I471 Supraventricular tachycardia: Secondary | ICD-10-CM | POA: Insufficient documentation

## 2019-10-29 DIAGNOSIS — I447 Left bundle-branch block, unspecified: Secondary | ICD-10-CM | POA: Diagnosis not present

## 2019-10-29 DIAGNOSIS — Z79899 Other long term (current) drug therapy: Secondary | ICD-10-CM | POA: Insufficient documentation

## 2019-10-29 DIAGNOSIS — Z8249 Family history of ischemic heart disease and other diseases of the circulatory system: Secondary | ICD-10-CM | POA: Diagnosis not present

## 2019-10-29 LAB — BASIC METABOLIC PANEL
Anion gap: 6 (ref 5–15)
BUN: 35 mg/dL — ABNORMAL HIGH (ref 6–20)
CO2: 23 mmol/L (ref 22–32)
Calcium: 8.9 mg/dL (ref 8.9–10.3)
Chloride: 107 mmol/L (ref 98–111)
Creatinine, Ser: 1.98 mg/dL — ABNORMAL HIGH (ref 0.61–1.24)
GFR calc Af Amer: 42 mL/min — ABNORMAL LOW (ref >60)
GFR calc non Af Amer: 36 mL/min — ABNORMAL LOW (ref >60)
Glucose, Bld: 96 mg/dL (ref 70–99)
Potassium: 5.2 mmol/L — ABNORMAL HIGH (ref 3.5–5.1)
Sodium: 136 mmol/L (ref 135–145)

## 2019-10-29 LAB — MAGNESIUM: Magnesium: 1.9 mg/dL (ref 1.7–2.4)

## 2019-10-29 MED ORDER — DAPAGLIFLOZIN PROPANEDIOL 10 MG PO TABS: 10.0000 mg | ORAL_TABLET | Freq: Every day | ORAL | 5 refills | Status: DC

## 2019-10-29 MED ORDER — FUROSEMIDE 40 MG PO TABS: ORAL_TABLET | ORAL | 3 refills | Status: DC

## 2019-10-29 NOTE — Telephone Encounter (Signed)
Patient Advocate Encounter   Received notification from [facility] Medicaid that prior authorization for Spencer Robertson is required.   PA submitted on Spencer Robertson Key: [id number] W Recipient ID: [id number] L Status is pending   Will continue to follow.  Audry Riles, PharmD, BCPS, BCCP, CPP Heart Failure Clinic Pharmacist [phone]

## 2019-10-29 NOTE — Progress Notes (Signed)
Cardiology: Dr. Graciela Husbands HF Cardiology: Dr. Shirlee Latch  58 y.o. with history of nonischemic cardiomyopathy with a long history of cardiomyopathy.  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  Echo in 7/18 showed EF 20-25% with diffuse hypokinesis and dilated RV.  He has a strong family history of CHF (father, sister, half-brother).  Sleep study in 12/18 with severe central sleep apnea, now on Bipap.  Echo in 8/19 showed EF 20-25%, moderate MR, mildly decreased RV systolic function.    Echo in 11/20 showed EF 25-30%, mildly decreased RV systolic function.   He returns for HF followup. He is still smoking about 4 cigarettes/day.  He is doing well symptomatically, no significant exertional dyspnea.  No lightheadedness.  No chest pain.  No orthopnea/PND.  He reports drinking a lot of fluid, says he has done this since he was a child.   ECG (personally reviewed): A-BiV sequential pacing, QTc 530 msec  [employer] device interrogation: No AT/AF, no VT, 99% BiV pacing  Labs (8/18): K 4.3, creatinine 0.88 Labs (08/17/2017) K 4.1 Creatinine 1.17, BNP 114 Labs (11/18): K 3.9, creatinine 1.14 Labs (4/19): K 4.5, creatinine 1.06 Labs (6/19): K 4, creatinine 1.09 Labs (9/19): K 3.9, creatinine 1.36 Labs (11/19): K 4.4, creatinine 1.27 Labs (3/20): K 4.5, creatinine 1.31 Labs (8/20): K 4.4, creatinine 1.34  PMH: 1. Central sleep apnea: Uses Bipap.  2. LBBB: s/p [employer] CRT-D.   3. 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  4. History of atrial tachycardia 5. Active smoker 6. Chronic systolic CHF: nonischemic cardiomyopathy.  Known since prior to 2005.  [employer] CRT-D.  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  - Echo (7/18): EF 20-25%, severe dilated LV, diffuse hypokinesis, severe RV dilation with low normal systolic function, moderate TR, PASP 52 mmHg.  - Gynecomastia with spironolactone.  - Echo (8/19): EF 20-25%, moderate MR, mildly decreased RV systolic function.  - CPX (9/19): peak  VO2 14.4, VE/VCO2 slope 40, RER 1.08.  Moderate functional limitation due to HF and body habitus.  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  - Echo (11/20): EF 25-30%, mildly decreased RV systolic function.   SH: Smokes 4 cigs/day, rare ETOH, occasional marijuana.   Lives with friend in [place].   FH: Father with cardiomyopathy, 1/2 brother died from CHF, sister with CHF.   ROS: All systems reviewed and negative except as per HPI.   Current Outpatient Medications  Medication Sig Dispense Refill  . aspirin 81 MG tablet Take 81 mg by mouth daily.      . carvedilol (COREG) 25 MG tablet Take 1 tablet (25 mg total) by mouth 2 (two) times daily with a meal. 180 tablet 1  . eplerenone (INSPRA) 50 MG tablet Take 1 tablet (50 mg total) by mouth daily. 30 tablet 11  . furosemide (LASIX) 40 MG tablet Take 1 tablet (40 mg total) by mouth every morning AND 0.5 tablets (20 mg total) every evening. 90 tablet 3  . isosorbide-hydrALAZINE (BIDIL) 20-37.5 MG tablet Take 1.5 tablets by mouth 3 (three) times daily. 405 tablet 3  . magnesium oxide (MAG-OX) 400 MG tablet Take 1 tablet (400 mg total) by mouth daily. 30 tablet 3  . mometasone (NASONEX) 50 MCG/ACT nasal spray 2 sprays each nostril each AM 17 g 3  . potassium chloride SA (K-DUR) 20 MEQ tablet Take 1 tablet (20 mEq total) by mouth daily. 90 tablet 1  . sacubitril-valsartan (ENTRESTO) 97-103 MG Take 1 tablet by mouth 2 (  two) times daily. 180 tablet 3  . sotalol (BETAPACE) 80 MG tablet Take 1 tablet (80 mg total) by mouth 2 (two) times daily. 180 tablet 3  . dapagliflozin propanediol (FARXIGA) 10 MG TABS tablet Take 10 mg by mouth daily before breakfast. 30 tablet 5   No current facility-administered medications for this encounter.    BP (!) 101/55   Pulse 64   Wt 125.6 kg (277 lb)   SpO2 100%   BMI 38.63 kg/m   General: NAD Neck: Thick, no JVD, no thyromegaly or thyroid nodule.  Lungs: Clear to auscultation bilaterally  with normal respiratory effort. CV: Nondisplaced PMI.  Heart regular S1/S2, no S3/S4, no murmur.  No peripheral edema.  No carotid bruit.  Normal pedal pulses.  Abdomen: Soft, nontender, no hepatosplenomegaly, no distention.  Skin: Intact without lesions or rashes.  Neurologic: Alert and oriented x 3.  Psych: Normal affect. Extremities: No clubbing or cyanosis.  HEENT: Normal.   Assessment/Plan: 1. Chronic systolic CHF: Nonischemic cardiomyopathy x years.  [employer] CRT-D device.  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  Echo in 8/19 with EF 20-25%, echo in 11/20 showed EF 25-30%.   He is not volume overloaded on exam.  CPX in 9/19 showed moderate functional limitation from heart failure.  He saw Dr. 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  Today, NYHA class II symptoms.  He is not volume overloaded on exam and is BiV pacing appropriately by device interrogation.  - Decrease Lasix to 40 qam/20 qpm.  - Start dapagliflozin 10 mg daily. BMET today and again in 2 wks.  - Continue Entresto 97/103 bid.  - Continue Coreg 25 mg bid.  - Intolerant spiro due to gynecomastia.  Continue eplerenone 50 mg daily.   - Continue Bidil 1.5 tabs tid.  - Taking sotalol b/c of high DFTs.  ECG showed paced QTc 530 msec (keep paced QTc < 550 msec).  - Cut back excessive fluid intake to < 2L/day.   2. Smoking: Discussed smoking cessation again.   3. Atrial Tachycardia: He is on sotalol, none noted on device interrogation today.  4. Central sleep apnea: Continue Bipap.    Followup with in 3 months.   Loralie Champagne 10/29/2019

## 2019-10-29 NOTE — Progress Notes (Signed)
Remote ICD transmission.   

## 2019-10-29 NOTE — Patient Instructions (Addendum)
START Farxiga 10mg  (1 tab) daily.  This medication requires prior authroization from [employer].  It can take up to 2 days to get a response. You have received a 30 day free card in office today.    DECREASE Lasix (furosemide) to 40mg  (1 tab) in the morning and 20mg  (1/2 tab) in the evening.   Labs today and repeat in 2 weeks We will only contact you if something comes back abnormal or we need to make some changes. Otherwise no news is good news!  DRINK LESS THAN 2 LITERS (68 OZ) OF FLUID DAILY.  Your physician recommends that you schedule a follow-up appointment in: 3 Months with Dr Aundra Dubin.   Please call office at [phone] option [phone] if you have any questions or concerns.   At the [facility], you and your health needs are our priority. As part of our continuing mission to provide you with exceptional heart care, we have created designated Provider Care Teams. These Care Teams include your primary Cardiologist (physician) and Advanced Practice Providers (APPs- Physician Assistants and Nurse Practitioners) who all work together to provide you with the care you need, when you need it.   You may see any of the following providers on your designated Care Team at your next follow up: Marland Kitchen Dr Glori Bickers . Dr Loralie Champagne . Darrick Grinder, NP . Lyda Jester, PA   Please be sure to bring in all your medications bottles to every appointment.

## 2019-10-30 NOTE — Telephone Encounter (Signed)
Advanced Heart Failure Patient Advocate Encounter  Prior Authorization for [facility] has been approved.    PA# [id number] Effective dates: 10/29/19 through 10/23/2020  Patients co-pay is $3.00  Audry Riles, PharmD, BCPS, BCCP, CPP Heart Failure Clinic Pharmacist [phone]

## 2019-10-31 ENCOUNTER — Telehealth (HOSPITAL_COMMUNITY): Payer: Self-pay

## 2019-10-31 MED ORDER — FUROSEMIDE 40 MG PO TABS: 40.0000 mg | ORAL_TABLET | Freq: Every day | ORAL | 3 refills | Status: DC

## 2019-10-31 MED ORDER — EPLERENONE 50 MG PO TABS: 25.0000 mg | ORAL_TABLET | Freq: Every day | ORAL | 11 refills | Status: DC

## 2019-10-31 NOTE — Telephone Encounter (Signed)
Med list updated to reflect changes that Dr. Aundra Dubin made to patients medications. See labs from 11/23

## 2019-11-12 ENCOUNTER — Other Ambulatory Visit: Payer: Self-pay

## 2019-11-12 ENCOUNTER — Ambulatory Visit (HOSPITAL_COMMUNITY)
Admission: RE | Admit: 2019-11-12 | Discharge: 2019-11-12 | Disposition: A | Discharge status: Home / Self Care | Payer: Medicaid Other | Source: Ambulatory Visit | Attending: Internal Medicine | Admitting: Internal Medicine

## 2019-11-12 DIAGNOSIS — I5022 Chronic systolic (congestive) heart failure: Secondary | ICD-10-CM | POA: Diagnosis not present

## 2019-11-12 LAB — BASIC METABOLIC PANEL
Anion gap: 9 (ref 5–15)
BUN: 24 mg/dL — ABNORMAL HIGH (ref 6–20)
CO2: 22 mmol/L (ref 22–32)
Calcium: 9.4 mg/dL (ref 8.9–10.3)
Chloride: 105 mmol/L (ref 98–111)
Creatinine, Ser: 1.53 mg/dL — ABNORMAL HIGH (ref 0.61–1.24)
GFR calc Af Amer: 58 mL/min — ABNORMAL LOW (ref >60)
GFR calc non Af Amer: 50 mL/min — ABNORMAL LOW (ref >60)
Glucose, Bld: 102 mg/dL — ABNORMAL HIGH (ref 70–99)
Potassium: 4.8 mmol/L (ref 3.5–5.1)
Sodium: 136 mmol/L (ref 135–145)

## 2019-11-28 ENCOUNTER — Other Ambulatory Visit: Payer: Self-pay

## 2019-12-03 ENCOUNTER — Telehealth: Payer: Self-pay | Admitting: *Deleted

## 2019-12-03 NOTE — Telephone Encounter (Signed)
Virtual Visit Pre-Appointment Phone Call  "(Name), I am calling you today to discuss your upcoming appointment. We are currently trying to limit exposure to the virus that causes COVID-19 by seeing patients at home rather than in the office."  1. "What is the BEST phone number to call the day of the visit?" - include this in appointment notes  2. "Do you have or have access to (through a family member/friend) a smartphone with video capability that we can use for your visit?" a. If yes - list this number in appt notes as "cell" (if different from BEST phone #) and list the appointment type as a VIDEO visit in appointment notes b. If no - list the appointment type as a PHONE visit in appointment notes  3. Confirm consent - "In the setting of the current Covid19 crisis, you are scheduled for a (phone or video) visit with your provider on (date) at (time).  Just as we do with many in-office visits, in order for you to participate in this visit, we must obtain consent.  If you'd like, I can send this to your mychart (if signed up) or email for you to review.  Otherwise, I can obtain your verbal consent now.  All virtual visits are billed to your insurance company just like a normal visit would be.  By agreeing to a virtual visit, we'd like you to understand that the technology does not allow for your provider to perform an examination, and thus may limit your provider's ability to fully assess your condition. If your provider identifies any concerns that need to be evaluated in person, we will make arrangements to do so.  Finally, though the technology is pretty good, we cannot assure that it will always work on either your or our end, and in the setting of a video visit, we may have to convert it to a phone-only visit.  In either situation, we cannot ensure that we have a secure connection.  Are you willing to proceed?" STAFF: Did the patient verbally acknowledge consent to telehealth visit? Document  YES/NO here: YES  4. Advise patient to be prepared - "Two hours prior to your appointment, go ahead and check your blood pressure, pulse, oxygen saturation, and your weight (if you have the equipment to check those) and write them all down. When your visit starts, your provider will ask you for this information. If you have an Apple Watch or Kardia device, please plan to have heart rate information ready on the day of your appointment. Please have a pen and paper handy nearby the day of the visit as well."  5. Give patient instructions for MyChart download to smartphone OR Doximity/Doxy.me as below if video visit (depending on what platform provider is using)  6. Inform patient they will receive a phone call 15 minutes prior to their appointment time (may be from unknown caller ID) so they should be prepared to answer    TELEPHONE CALL NOTE  ASIAH BEFORT has been deemed a candidate for a follow-up tele-health visit to limit community exposure during the Covid-19 pandemic. I spoke with the patient via phone to ensure availability of phone/video source, confirm preferred email & phone number, and discuss instructions and expectations.  I reminded Bailey Mech to be prepared with any vital sign and/or heart rhythm information that could potentially be obtained via home monitoring, at the time of his visit. I reminded Bailey Mech to expect a phone call prior to  his visit.  Latrelle Dodrill, CMA 12/03/2019 6:10 PM   INSTRUCTIONS FOR DOWNLOADING THE MYCHART APP TO SMARTPHONE  - The patient must first make sure to have activated MyChart and know their login information - If Apple, go to [employer] and type in MyChart in the search bar and download the app. If Android, ask patient to go to [employer] and type in [place] in the search bar and download the app. The app is free but as with any other app downloads, their phone may require them to verify saved payment information or  Apple/Android password.  - The patient will need to then log into the app with their MyChart username and password, and select [facility] as their healthcare provider to link the account. When it is time for your visit, go to the MyChart app, find appointments, and click Begin Video Visit. Be sure to Select Allow for your device to access the Microphone and Camera for your visit. You will then be connected, and your provider will be with you shortly.  **If they have any issues connecting, or need assistance please contact MyChart service desk [phone])**  **If using a computer, in order to ensure the best quality for their visit they will need to use either of the following Internet Browsers: [employer] or Google Chrome**  IF USING DOXIMITY or DOXY.ME - The patient will receive a link just prior to their visit by text.     FULL LENGTH CONSENT FOR TELE-HEALTH VISIT   I hereby voluntarily request, consent and authorize [facility] and its employed or contracted physicians, physician assistants, nurse practitioners or other licensed health care professionals (the Practitioner), to provide me with telemedicine health care services (the "Services") as deemed necessary by the treating Practitioner. I acknowledge and consent to receive the Services by the Practitioner via telemedicine. I understand that the telemedicine visit will involve communicating with the Practitioner through live audiovisual communication technology and the disclosure of certain medical information by electronic transmission. I acknowledge that I have been given the opportunity to request an in-person assessment or other available alternative prior to the telemedicine visit and am voluntarily participating in the telemedicine visit.  I understand that I have the right to withhold or withdraw my consent to the use of telemedicine in the course of my care at any time, without affecting my right to future care  or treatment, and that the Practitioner or I may terminate the telemedicine visit at any time. I understand that I have the right to inspect all information obtained and/or recorded in the course of the telemedicine visit and may receive copies of available information for a reasonable fee.  I understand that some of the potential risks of receiving the Services via telemedicine include:  Marland Kitchen Delay or interruption in medical evaluation due to technological equipment failure or disruption; . Information transmitted may not be sufficient (e.g. poor resolution of images) to allow for appropriate medical decision making by the Practitioner; and/or  . In rare instances, security protocols could fail, causing a breach of personal health information.  Furthermore, I acknowledge that it is my responsibility to provide information about my medical history, conditions and care that is complete and accurate to the best of my ability. I acknowledge that Practitioner's advice, recommendations, and/or decision may be based on factors not within their control, such as incomplete or inaccurate data provided by me or distortions of diagnostic images or specimens that may result from electronic transmissions.  I understand that the practice of medicine is not an exact science and that Practitioner makes no warranties or guarantees regarding treatment outcomes. I acknowledge that I will receive a copy of this consent concurrently upon execution via email to the email address I last provided but may also request a printed copy by calling the office of [facility].    I understand that my insurance will be billed for this visit.   I have read or had this consent read to me. . I understand the contents of this consent, which adequately explains the benefits and risks of the Services being provided via telemedicine.  . I have been provided ample opportunity to ask questions regarding this consent and the Services and have had  my questions answered to my satisfaction. . I give my informed consent for the services to be provided through the use of telemedicine in my medical care  By participating in this telemedicine visit I agree to the above.

## 2019-12-20 ENCOUNTER — Other Ambulatory Visit: Payer: Self-pay

## 2019-12-20 ENCOUNTER — Telehealth (INDEPENDENT_AMBULATORY_CARE_PROVIDER_SITE_OTHER): Payer: Medicaid Other | Admitting: Cardiology

## 2019-12-20 ENCOUNTER — Telehealth: Payer: Self-pay | Admitting: *Deleted

## 2019-12-20 ENCOUNTER — Encounter: Payer: Self-pay | Admitting: Cardiology

## 2019-12-20 VITALS — Ht 71.0 in | Wt 260.0 lb

## 2019-12-20 DIAGNOSIS — G4733 Obstructive sleep apnea (adult) (pediatric): Secondary | ICD-10-CM

## 2019-12-20 DIAGNOSIS — I10 Essential (primary) hypertension: Secondary | ICD-10-CM

## 2019-12-20 NOTE — Telephone Encounter (Signed)
-----   Message from Quintella Reichert, MD sent at 12/20/2019  1:12 PM EST ----- Please change patient back to BiPAP S/T at 23/19cm H2O and get a downoad in 2 weeks.  Followup with me in 1 year  Traci

## 2019-12-20 NOTE — Progress Notes (Signed)
Virtual Visit via Telephone Note   This visit type was conducted due to national recommendations for restrictions regarding the COVID-19 Pandemic (e.g. social distancing) in an effort to limit this patient's exposure and mitigate transmission in our community.  Due to his co-morbid illnesses, this patient is at least at moderate risk for complications without adequate follow up.  This format is felt to be most appropriate for this patient at this time.  The patient did not have access to video technology/had technical difficulties with video requiring transitioning to audio format only (telephone).  All issues noted in this document were discussed and addressed.  No physical exam could be performed with this format.  Please refer to the patient's chart for his  consent to telehealth for [facility].  Evaluation Performed:  Follow-up visit  This visit type was conducted due to national recommendations for restrictions regarding the COVID-19 Pandemic (e.g. social distancing).  This format is felt to be most appropriate for this patient at this time.  All issues noted in this document were discussed and addressed.  No physical exam was performed (except for noted visual exam findings with Video Visits).  Please refer to the patient's chart (MyChart message for video visits and phone note for telephone visits) for the patient's consent to telehealth for [facility].  Date:  12/20/2019   ID:  Spencer Robertson, DOB [birth year], MRN [id number]  Patient Location:  Home  Provider location:   [facility]  PCP:  Patient, No Pcp Per  Sleep Medicine:  Armanda Magic, MD Electrophysiologist:  None   Chief Complaint:  OSA  History of Present Illness:    Spencer Robertson is a 59 y.o. male who presents via audio/video conferencing for a telehealth visit today.    Spencer Robertson is a 59 y.o. male with a hx of severe OSA who had been noncompliant with PAP in the pastbut have not seen him in 2 years. He was  he was referred by Clementeen Hoof, PA-C for repeat sleep study as he was not using his PAP.He was found to have severe obstructive sleep apnea with an AHI of 100.1. He also had severe central sleep apnea with a central apnea index of 40.4/h. He had nocturnal hypoxemia with oxygen saturations as low as 61%. He underwent CPAP titration but cannot be adequately titrated due to ongoing respiratory events and was switched over to BiPAP. He could not be adequately controlled on BiPAP therapy as well due to ongoing respiratory events and was placed on auto BiPAP titration at home but still had an elevated AHI.  He went back to sleep lab and was titrated on BiPAP to BiPAP S/T at 23/18cm H2O.   The patient does not have symptoms concerning for COVID-19 infection (fever, chills, cough, or new shortness of breath).    Prior CV studies:   The following studies were reviewed today:  PAP compliance download  Past Medical History:  Diagnosis Date  . CHF (congestive heart failure) (HCC)   . Hyperlipidemia   . Hypertension   . Mitral regurgitation   . Nonischemic cardiomyopathy (HCC)   . Obesity   . Obesity (BMI 30-39.9) 09/01/2015  . OSA (obstructive sleep apnea)    severe  . S/P implantation of automatic cardioverter/defibrillator (AICD)    Guidant Contact H177  . Tobacco abuse    Past Surgical History:  Procedure Laterality Date  . CARDIAC DEFIBRILLATOR PLACEMENT     Guidant Contak H177 device  Current Meds  Medication Sig  . aspirin 81 MG tablet Take 81 mg by mouth daily.    . carvedilol (COREG) 25 MG tablet Take 1 tablet (25 mg total) by mouth 2 (two) times daily with a meal.  . dapagliflozin propanediol (FARXIGA) 10 MG TABS tablet Take 10 mg by mouth daily before breakfast.  . eplerenone (INSPRA) 50 MG tablet Take 0.5 tablets (25 mg total) by mouth daily.  . furosemide (LASIX) 40 MG tablet Take 1 tablet (40 mg total) by mouth daily.  . isosorbide-hydrALAZINE (BIDIL) 20-37.5 MG tablet  Take 1.5 tablets by mouth 3 (three) times daily.  . magnesium oxide (MAG-OX) 400 MG tablet Take 1 tablet (400 mg total) by mouth daily.  . mometasone (NASONEX) 50 MCG/ACT nasal spray 2 sprays each nostril each AM  . potassium chloride SA (K-DUR) 20 MEQ tablet Take 1 tablet (20 mEq total) by mouth daily.  . sacubitril-valsartan (ENTRESTO) 97-103 MG Take 1 tablet by mouth 2 (two) times daily.  . sotalol (BETAPACE) 80 MG tablet Take 1 tablet (80 mg total) by mouth 2 (two) times daily.     Allergies:   Spironolactone   Social History   Tobacco Use  . Smoking status: Current Some Day Smoker    Packs/day: 0.25    Years: 20.00    Pack years: 5.00    Types: Cigarettes  . Smokeless tobacco: Never Used  Substance Use Topics  . Alcohol use: Yes    Comment: socially  . Drug use: Yes    Types: Marijuana    Comment: occasional     Family Hx: The patient's family history includes Cardiomyopathy in his father; Heart disease in his brother and sister; Hypertension in his unknown relative.  ROS:   Please see the history of present illness.     All other systems reviewed and are negative.   Labs/Other Tests and Data Reviewed:    Recent Labs: 10/29/2019: Magnesium 1.9 11/12/2019: BUN 24; Creatinine, Ser 1.53; Potassium 4.8; Sodium 136   Recent Lipid Panel Lab Results  Component Value Date/Time   CHOL  09/13/2007 04:05 AM    162        ATP III CLASSIFICATION:  <200     mg/dL   Desirable  200-239  mg/dL   Borderline High  >=240    mg/dL   High   TRIG 77 09/13/2007 04:05 AM   HDL 46 09/13/2007 04:05 AM   CHOLHDL 3.5 09/13/2007 04:05 AM   LDLCALC (H) 09/13/2007 04:05 AM    101        Total Cholesterol/HDL:CHD Risk Coronary Heart Disease Risk Table                     Men   Women  1/2 Average Risk   3.4   3.3    Wt Readings from Last 3 Encounters:  12/20/19 260 lb (117.9 kg)  10/29/19 277 lb (125.6 kg)  10/23/19 278 lb (126.1 kg)     Objective:    Vital Signs:  Ht 5\' 11"   (1.803 m)   Wt 260 lb (117.9 kg)   BMI 36.26 kg/m     ASSESSMENT & PLAN:    1.  OSA - The patient is tolerating PAP therapy well without any problems. The PAP download was reviewed today and showed an AHI of 45.7/hr on 18/12 cm H2O with 70% compliance in using more than 4 hours nightly.  The patient has been using and benefiting from PAP  use and will continue to benefit from therapy.  -his AHI is too high but does not show much of a mask leak on the download.  -he was supposed to be set at 23/19cm H2O but go lowered for unclear reasons -I am going to increase him back to  BIPAP S/T 23/19cm H2O   -I will get a download in 2 weeks  2.  HTN  -continue Carvedilol 25mg  BID, Bidil 20-37.5mg  with 1.5tab BID, Entresto 97-103mg  BID  3.  Morbid Obesity -I have encouraged him to get into a routine exercise program and cut back on carbs and portions.    COVID-19 Education: The signs and symptoms of COVID-19 were discussed with the patient and how to seek care for testing (follow up with PCP or arrange E-visit).  The importance of social distancing was discussed today.  Patient Risk:   After full review of this patient's clinical status, I feel that they are at least moderate risk at this time.  Time:   Today, I have spent 20 minutes on telemedicine discussing medical problems including OSA< HTN, obesity.  We also reviewed the symptoms of COVID 19 and the ways to protect against contracting the virus with telehealth technology.  I spent an additional 5 minutes reviewing patient's chart including PAP compliance download.  Medication Adjustments/Labs and Tests Ordered: Current medicines are reviewed at length with the patient today.  Concerns regarding medicines are outlined above.  Tests Ordered: No orders of the defined types were placed in this encounter.  Medication Changes: No orders of the defined types were placed in this encounter.   Disposition:  Follow up in 1  year(s)  Signed, , MD  12/20/2019 1:00 PM    [facility]

## 2019-12-20 NOTE — Telephone Encounter (Addendum)
Order placed to [employer] via community message.  1 year follow up made.

## 2020-01-02 ENCOUNTER — Other Ambulatory Visit (HOSPITAL_COMMUNITY): Payer: Self-pay

## 2020-01-02 MED ORDER — POTASSIUM CHLORIDE CRYS ER 20 MEQ PO TBCR: 20.0000 meq | EXTENDED_RELEASE_TABLET | Freq: Every day | ORAL | 1 refills | Status: DC

## 2020-01-17 ENCOUNTER — Other Ambulatory Visit: Payer: Self-pay | Admitting: Internal Medicine

## 2020-02-01 ENCOUNTER — Encounter (HOSPITAL_COMMUNITY): Payer: Medicaid Other | Admitting: Cardiology

## 2020-02-04 ENCOUNTER — Ambulatory Visit (INDEPENDENT_AMBULATORY_CARE_PROVIDER_SITE_OTHER): Payer: Medicaid Other | Admitting: *Deleted

## 2020-02-04 ENCOUNTER — Other Ambulatory Visit: Payer: Self-pay

## 2020-02-04 ENCOUNTER — Ambulatory Visit (HOSPITAL_COMMUNITY)
Admission: RE | Admit: 2020-02-04 | Discharge: 2020-02-04 | Disposition: A | Discharge status: Home / Self Care | Payer: Medicaid Other | Source: Ambulatory Visit | Attending: Cardiology | Admitting: Cardiology

## 2020-02-04 ENCOUNTER — Encounter (HOSPITAL_COMMUNITY): Payer: Self-pay | Admitting: Cardiology

## 2020-02-04 VITALS — BP 110/88 | HR 67 | Wt 278.6 lb

## 2020-02-04 DIAGNOSIS — I5022 Chronic systolic (congestive) heart failure: Secondary | ICD-10-CM

## 2020-02-04 DIAGNOSIS — Z95810 Presence of automatic (implantable) cardiac defibrillator: Secondary | ICD-10-CM | POA: Insufficient documentation

## 2020-02-04 DIAGNOSIS — F17210 Nicotine dependence, cigarettes, uncomplicated: Secondary | ICD-10-CM | POA: Diagnosis not present

## 2020-02-04 DIAGNOSIS — Z7984 Long term (current) use of oral hypoglycemic drugs: Secondary | ICD-10-CM | POA: Insufficient documentation

## 2020-02-04 DIAGNOSIS — Z8249 Family history of ischemic heart disease and other diseases of the circulatory system: Secondary | ICD-10-CM | POA: Diagnosis not present

## 2020-02-04 DIAGNOSIS — I447 Left bundle-branch block, unspecified: Secondary | ICD-10-CM | POA: Diagnosis not present

## 2020-02-04 DIAGNOSIS — Z79899 Other long term (current) drug therapy: Secondary | ICD-10-CM | POA: Insufficient documentation

## 2020-02-04 DIAGNOSIS — Z7982 Long term (current) use of aspirin: Secondary | ICD-10-CM | POA: Insufficient documentation

## 2020-02-04 DIAGNOSIS — G4731 Primary central sleep apnea: Secondary | ICD-10-CM | POA: Insufficient documentation

## 2020-02-04 DIAGNOSIS — I428 Other cardiomyopathies: Secondary | ICD-10-CM | POA: Diagnosis not present

## 2020-02-04 LAB — CUP PACEART REMOTE DEVICE CHECK
Battery Remaining Longevity: 12 mo
Battery Remaining Percentage: 17 %
Brady Statistic RA Percent Paced: 36 %
Brady Statistic RV Percent Paced: 100 %
Date Time Interrogation Session: 20210301112100
HighPow Impedance: 61 Ohm
Implantable Lead Implant Date: 20051122
Implantable Lead Implant Date: 20051122
Implantable Lead Implant Date: 20051122
Implantable Lead Location: 753858
Implantable Lead Location: 753859
Implantable Lead Location: 753860
Implantable Lead Model: 158
Implantable Lead Model: 4194
Implantable Lead Model: 5076
Implantable Lead Serial Number: 159458
Implantable Pulse Generator Implant Date: 20110831
Lead Channel Impedance Value: 465 Ohm
Lead Channel Impedance Value: 495 Ohm
Lead Channel Impedance Value: 751 Ohm
Lead Channel Pacing Threshold Amplitude: 0.9 V
Lead Channel Pacing Threshold Amplitude: 1 V
Lead Channel Pacing Threshold Amplitude: 1.2 V
Lead Channel Pacing Threshold Pulse Width: 0.4 ms
Lead Channel Pacing Threshold Pulse Width: 0.4 ms
Lead Channel Pacing Threshold Pulse Width: 0.4 ms
Lead Channel Setting Pacing Amplitude: 2 V
Lead Channel Setting Pacing Amplitude: 2 V
Lead Channel Setting Pacing Amplitude: 2.4 V
Lead Channel Setting Pacing Pulse Width: 0.4 ms
Lead Channel Setting Pacing Pulse Width: 0.4 ms
Lead Channel Setting Sensing Sensitivity: 0.5 mV
Lead Channel Setting Sensing Sensitivity: 1 mV
Pulse Gen Serial Number: 480425

## 2020-02-04 LAB — BASIC METABOLIC PANEL
Anion gap: 11 (ref 5–15)
BUN: 26 mg/dL — ABNORMAL HIGH (ref 6–20)
CO2: 22 mmol/L (ref 22–32)
Calcium: 8.8 mg/dL — ABNORMAL LOW (ref 8.9–10.3)
Chloride: 102 mmol/L (ref 98–111)
Creatinine, Ser: 1.71 mg/dL — ABNORMAL HIGH (ref 0.61–1.24)
GFR calc Af Amer: 50 mL/min — ABNORMAL LOW (ref >60)
GFR calc non Af Amer: 43 mL/min — ABNORMAL LOW (ref >60)
Glucose, Bld: 113 mg/dL — ABNORMAL HIGH (ref 70–99)
Potassium: 4.3 mmol/L (ref 3.5–5.1)
Sodium: 135 mmol/L (ref 135–145)

## 2020-02-04 NOTE — Progress Notes (Signed)
Cardiology: Dr. Graciela Husbands HF Cardiology: Dr. Shirlee Latch  59 y.o. with history of nonischemic cardiomyopathy with a long history of cardiomyopathy.  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  Echo in 7/18 showed EF 20-25% with diffuse hypokinesis and dilated RV.  He has a strong family history of CHF (father, sister, half-brother).  Sleep study in 12/18 with severe central sleep apnea, now on Bipap.  Echo in 8/19 showed EF 20-25%, moderate MR, mildly decreased RV systolic function.    Echo in 11/20 showed EF 25-30%, mildly decreased RV systolic function.   He returns for HF followup. He is still smoking about 4 cigarettes/day.  Eplerenone was decreased to 25 mg daily due to hyperkalemia.  Occasional mild lightheadedness with standing, no falls or syncope.  No significant exertional dyspnea, no problems walking up stairs. No orthopnea/PND.  No chest pain. Using CPAP.  Weight is stable.   ECG (personally reviewed): A-BiV sequential pacing, QTc 529 msec  [employer] device interrogation: No AT/AF, no VT, 98% BiV pacing  Labs (8/18): K 4.3, creatinine 0.88 Labs (08/17/2017) K 4.1 Creatinine 1.17, BNP 114 Labs (11/18): K 3.9, creatinine 1.14 Labs (4/19): K 4.5, creatinine 1.06 Labs (6/19): K 4, creatinine 1.09 Labs (9/19): K 3.9, creatinine 1.36 Labs (11/19): K 4.4, creatinine 1.27 Labs (3/20): K 4.5, creatinine 1.31 Labs (8/20): K 4.4, creatinine 1.34 Labs (12/20): K 4.8, creatinine 1.53  PMH: 1. Central sleep apnea: Uses Bipap.  2. LBBB: s/p [employer] CRT-D.   3. 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  4. History of atrial tachycardia 5. Active smoker 6. Chronic systolic CHF: nonischemic cardiomyopathy.  Known since prior to 2005.  [employer] CRT-D.  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  - Echo (7/18): EF 20-25%, severe dilated LV, diffuse hypokinesis, severe RV dilation with low normal systolic function, moderate TR, PASP 52 mmHg.  - Gynecomastia with spironolactone.  - Echo  (8/19): EF 20-25%, moderate MR, mildly decreased RV systolic function.  - CPX (9/19): peak VO2 14.4, VE/VCO2 slope 40, RER 1.08.  Moderate functional limitation due to HF and body habitus.  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  - Echo (11/20): EF 25-30%, mildly decreased RV systolic function.   SH: Smokes 4 cigs/day, rare ETOH, occasional marijuana.   Lives with friend in [place].   FH: Father with cardiomyopathy, 1/2 brother died from CHF, sister with CHF.   ROS: All systems reviewed and negative except as per HPI.   Current Outpatient Medications  Medication Sig Dispense Refill  . aspirin 81 MG tablet Take 81 mg by mouth daily.      . carvedilol (COREG) 25 MG tablet TAKE 1 TABLET BY MOUTH TWICE DAILY WITH A MEAL 180 tablet 3  . dapagliflozin propanediol (FARXIGA) 10 MG TABS tablet Take 10 mg by mouth daily before breakfast. 30 tablet 5  . eplerenone (INSPRA) 50 MG tablet Take 0.5 tablets (25 mg total) by mouth daily. 15 tablet 11  . furosemide (LASIX) 40 MG tablet Take 1 tablet (40 mg total) by mouth daily. 90 tablet 3  . isosorbide-hydrALAZINE (BIDIL) 20-37.5 MG tablet Take 1.5 tablets by mouth 3 (three) times daily. 405 tablet 3  . magnesium oxide (MAG-OX) 400 MG tablet Take 1 tablet (400 mg total) by mouth daily. 30 tablet 3  . mometasone (NASONEX) 50 MCG/ACT nasal spray 2 sprays each nostril each AM (Patient taking differently: Place 2 sprays into the nose as needed. ) 17 g 3  . potassium chloride  SA (KLOR-CON) 20 MEQ tablet Take 1 tablet (20 mEq total) by mouth daily. 90 tablet 1  . sacubitril-valsartan (ENTRESTO) 97-103 MG Take 1 tablet by mouth 2 (two) times daily. 180 tablet 3  . sotalol (BETAPACE) 80 MG tablet Take 1 tablet (80 mg total) by mouth 2 (two) times daily. 180 tablet 3   No current facility-administered medications for this encounter.   BP 110/88   Pulse 67   Wt 126.4 kg (278 lb 9.6 oz)   SpO2 95%   BMI 38.86 kg/m   General:  NAD Neck: Thick, no JVD, no thyromegaly or thyroid nodule.  Lungs: Clear to auscultation bilaterally with normal respiratory effort. CV: Nondisplaced PMI.  Heart regular S1/S2, no S3/S4, no murmur.  No peripheral edema.  No carotid bruit.  Normal pedal pulses.  Abdomen: Soft, nontender, no hepatosplenomegaly, no distention.  Skin: Intact without lesions or rashes.  Neurologic: Alert and oriented x 3.  Psych: Normal affect. Extremities: No clubbing or cyanosis.  HEENT: Normal.   Assessment/Plan: 1. Chronic systolic CHF: Nonischemic cardiomyopathy x years.  [employer] CRT-D device.  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  Echo in 8/19 with EF 20-25%, echo in 11/20 showed EF 25-30%.   He is not volume overloaded on exam.  CPX in 9/19 showed moderate functional limitation from heart failure.  He saw Dr. 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  Today, NYHA class II symptoms.  He is not volume overloaded on exam and is BiV pacing appropriately by device interrogation.  - Continue Lasix 40 mg daily. BMET today.  - Continue dapagliflozin 10 mg daily.   - Continue Entresto 97/103 bid.  - Continue Coreg 25 mg bid.  - Intolerant spiro due to gynecomastia.  Continue eplerenone 25 mg daily (decreased due to hyperkalemia).    - Continue Bidil 1.5 tabs tid.  I will not increase today with relatively soft BP.  - Taking sotalol b/c of high DFTs.  ECG showed paced QTc 529 msec (keep paced QTc < 550 msec).  - Cut back excessive fluid intake to < 2L/day.   2. Smoking: Discussed smoking cessation again.   3. Atrial Tachycardia: He is on sotalol, none noted on device interrogation today.  4. Central sleep apnea: Continue Bipap.    Followup with in 4 months.   Loralie Champagne 02/04/2020

## 2020-02-04 NOTE — Patient Instructions (Signed)
NO medication changes today!   Labs today We will only contact you if something comes back abnormal or we need to make some changes. Otherwise no news is good news!   Your physician recommends that you schedule a follow-up appointment in: 4 months with Dr Shirlee Latch. We will call you to schedule this appointment.     Please call office at [phone] option 2 if you have any questions or concerns.    At the [facility], you and your health needs are our priority. As part of our continuing mission to provide you with exceptional heart care, we have created designated Provider Care Teams. These Care Teams include your primary Cardiologist (physician) and Advanced Practice Providers (APPs- Physician Assistants and Nurse Practitioners) who all work together to provide you with the care you need, when you need it.   You may see any of the following providers on your designated Care Team at your next follow up: Marland Kitchen Dr Arvilla Meres . Dr Marca Ancona . Tonye Becket, NP . Robbie Lis, PA . Karle Plumber, PharmD   Please be sure to bring in all your medications bottles to every appointment.

## 2020-02-05 NOTE — Progress Notes (Signed)
ICD Remote  

## 2020-05-08 ENCOUNTER — Other Ambulatory Visit (HOSPITAL_COMMUNITY): Payer: Self-pay

## 2020-05-08 MED ORDER — SOTALOL HCL 80 MG PO TABS: 80.0000 mg | ORAL_TABLET | Freq: Two times a day (BID) | ORAL | 3 refills | Status: DC

## 2020-05-08 NOTE — Telephone Encounter (Signed)
Meds ordered this encounter  Medications  . sotalol (BETAPACE) 80 MG tablet    Sig: Take 1 tablet (80 mg total) by mouth 2 (two) times daily.    Dispense:  180 tablet    Refill:  3

## 2020-05-14 ENCOUNTER — Other Ambulatory Visit (HOSPITAL_COMMUNITY): Payer: Self-pay

## 2020-05-14 MED ORDER — DAPAGLIFLOZIN PROPANEDIOL 10 MG PO TABS: 10.0000 mg | ORAL_TABLET | Freq: Every day | ORAL | 3 refills | Status: DC

## 2020-05-14 NOTE — Telephone Encounter (Signed)
Meds ordered this encounter  Medications  . dapagliflozin propanediol (FARXIGA) 10 MG TABS tablet    Sig: Take 1 tablet (10 mg total) by mouth daily before breakfast.    Dispense:  90 tablet    Refill:  3

## 2020-06-23 ENCOUNTER — Emergency Department (HOSPITAL_COMMUNITY)
Admission: EM | Admit: 2020-06-23 | Discharge: 2020-06-24 | Disposition: A | Discharge status: Home / Self Care | Payer: Medicaid Other | Attending: Emergency Medicine | Admitting: Emergency Medicine

## 2020-06-23 ENCOUNTER — Encounter (HOSPITAL_COMMUNITY): Payer: Self-pay | Admitting: *Deleted

## 2020-06-23 ENCOUNTER — Other Ambulatory Visit: Payer: Self-pay

## 2020-06-23 DIAGNOSIS — I110 Hypertensive heart disease with heart failure: Secondary | ICD-10-CM | POA: Insufficient documentation

## 2020-06-23 DIAGNOSIS — F17210 Nicotine dependence, cigarettes, uncomplicated: Secondary | ICD-10-CM | POA: Diagnosis not present

## 2020-06-23 DIAGNOSIS — Z7982 Long term (current) use of aspirin: Secondary | ICD-10-CM | POA: Diagnosis not present

## 2020-06-23 DIAGNOSIS — R109 Unspecified abdominal pain: Secondary | ICD-10-CM

## 2020-06-23 DIAGNOSIS — R1030 Lower abdominal pain, unspecified: Secondary | ICD-10-CM | POA: Insufficient documentation

## 2020-06-23 DIAGNOSIS — Z79899 Other long term (current) drug therapy: Secondary | ICD-10-CM | POA: Diagnosis not present

## 2020-06-23 DIAGNOSIS — I509 Heart failure, unspecified: Secondary | ICD-10-CM | POA: Insufficient documentation

## 2020-06-23 LAB — COMPREHENSIVE METABOLIC PANEL
ALT: 13 U/L (ref 0–44)
AST: 17 U/L (ref 15–41)
Albumin: 3.3 g/dL — ABNORMAL LOW (ref 3.5–5.0)
Alkaline Phosphatase: 47 U/L (ref 38–126)
Anion gap: 7 (ref 5–15)
BUN: 20 mg/dL (ref 6–20)
CO2: 26 mmol/L (ref 22–32)
Calcium: 9 mg/dL (ref 8.9–10.3)
Chloride: 105 mmol/L (ref 98–111)
Creatinine, Ser: 1.46 mg/dL — ABNORMAL HIGH (ref 0.61–1.24)
GFR calc Af Amer: >60 mL/min (ref >60)
GFR calc non Af Amer: 52 mL/min — ABNORMAL LOW (ref >60)
Glucose, Bld: 112 mg/dL — ABNORMAL HIGH (ref 70–99)
Potassium: 4 mmol/L (ref 3.5–5.1)
Sodium: 138 mmol/L (ref 135–145)
Total Bilirubin: 0.6 mg/dL (ref 0.3–1.2)
Total Protein: 7 g/dL (ref 6.5–8.1)

## 2020-06-23 LAB — URINALYSIS, ROUTINE W REFLEX MICROSCOPIC
Bacteria, UA: NONE SEEN
Bilirubin Urine: NEGATIVE
Glucose, UA: >=500 mg/dL — AB
Hgb urine dipstick: NEGATIVE
Ketones, ur: NEGATIVE mg/dL
Leukocytes,Ua: NEGATIVE
Nitrite: NEGATIVE
Protein, ur: NEGATIVE mg/dL
Specific Gravity, Urine: 1.007 (ref 1.005–1.030)
pH: 5 (ref 5.0–8.0)

## 2020-06-23 LAB — CBC
HCT: 45.7 % (ref 39.0–52.0)
Hemoglobin: 15 g/dL (ref 13.0–17.0)
MCH: 30.7 pg (ref 26.0–34.0)
MCHC: 32.8 g/dL (ref 30.0–36.0)
MCV: 93.5 fL (ref 80.0–100.0)
Platelets: 143 10*3/uL — ABNORMAL LOW (ref 150–400)
RBC: 4.89 MIL/uL (ref 4.22–5.81)
RDW: 15.1 % (ref 11.5–15.5)
WBC: 5 10*3/uL (ref 4.0–10.5)
nRBC: 0 % (ref 0.0–0.2)

## 2020-06-23 LAB — LIPASE, BLOOD: Lipase: 41 U/L (ref 11–51)

## 2020-06-23 MED ORDER — SODIUM CHLORIDE 0.9% FLUSH: 3.0000 mL | Freq: Once | INTRAVENOUS | Status: DC

## 2020-06-23 NOTE — ED Triage Notes (Signed)
Pt reports coughing 1-2 weeks ago and onset of left side pain. Now has burning pain into his abd. Denies any other symptom. No distress noted at triage.

## 2020-06-24 ENCOUNTER — Emergency Department (HOSPITAL_COMMUNITY): Payer: Medicaid Other

## 2020-06-24 MED ORDER — METHOCARBAMOL 500 MG PO TABS
500.0000 mg | ORAL_TABLET | Freq: Two times a day (BID) | ORAL | 0 refills | Status: DC
Start: 2020-06-24 — End: 2020-08-25

## 2020-06-24 MED ORDER — ACETAMINOPHEN 325 MG PO TABS
650.0000 mg | ORAL_TABLET | Freq: Once | ORAL | Status: AC
  Administered 2020-06-24: 650 mg via ORAL
  Filled 2020-06-24: qty 2

## 2020-06-24 NOTE — ED Provider Notes (Addendum)
[facility] EMERGENCY DEPARTMENT Provider Note   CSN: [id number] Arrival date & time: 06/23/20  1919     History Chief Complaint  Patient presents with  . Flank Pain  . Abdominal Pain    Spencer Robertson is a 59 y.o. male.  HPI 59 year old male with history of CHF with an EF of 25 to 30% as of November 2020, hypertension, hyperlipidemia, and mitral regurg, nonischemic cardiomyopathy, obesity, tobacco abuse presents to the ER for 2 weeks of back pain.  Patient stated that approximately 2 weeks ago he sneezed, and has been having left-sided low back pain.  Aggravating factor include especially when bending down or twisting.  No alleviating factors.  He denies any groin numbness, loss of bowel bladder control, numbness or tingling down his legs.  He denies any dysuria or hematuria.  Denies any chest pain or shortness of breath.  No history of abdominal aortic aneurysm.  He denies any swelling to his lower extremities.  Triage nurse noted radiation into his abdomen, however he denies this upon my questioning.  No fevers or night sweats.    Past Medical History:  Diagnosis Date  . CHF (congestive heart failure) (HCC)   . Hyperlipidemia   . Hypertension   . Mitral regurgitation   . Nonischemic cardiomyopathy (HCC)   . Obesity   . Obesity (BMI 30-39.9) 09/01/2015  . OSA (obstructive sleep apnea)    severe  . S/P implantation of automatic cardioverter/defibrillator (AICD)    Guidant Contact H177  . Tobacco abuse     Patient Active Problem List   Diagnosis Date Noted  . Benign essential HTN 09/01/2015  . Obesity (BMI 30-39.9) 09/01/2015  . Orthostatic lightheadedness 01/18/2014  . Biventricular defibrillator - CRT-[employer] 12/10/2011  . Tobacco abuse   . CARDIOMYOPATHY, PRIMARY, DILATED 08/12/2009  . SYSTOLIC HEART FAILURE, CHRONIC 08/12/2009  . OSA (obstructive sleep apnea) 08/12/2009    Past Surgical History:  Procedure Laterality Date  . CARDIAC  DEFIBRILLATOR PLACEMENT     Guidant Contak H177 device        Family History  Problem Relation Age of Onset  . Cardiomyopathy Father   . Hypertension Other   . Heart disease Sister   . Heart disease Brother     Social History   Tobacco Use  . Smoking status: Current Some Day Smoker    Packs/day: 0.25    Years: 20.00    Pack years: 5.00    Types: Cigarettes  . Smokeless tobacco: Never Used  Vaping Use  . Vaping Use: Never used  Substance Use Topics  . Alcohol use: Yes    Comment: socially  . Drug use: Yes    Types: Marijuana    Comment: occasional    Home Medications Prior to Admission medications   Medication Sig Start Date End Date Taking? Authorizing Provider  aspirin 81 MG tablet Take 81 mg by mouth daily.     Yes [provider]  carvedilol (COREG) 25 MG tablet TAKE 1 TABLET BY MOUTH TWICE DAILY WITH A MEAL Patient taking differently: Take 25 mg by mouth 2 (two) times daily with a meal.  01/17/20  Yes Turner, Cornelious Bryant, MD  cetirizine (ZYRTEC) 10 MG chewable tablet Chew 10 mg by mouth daily.   Yes [provider]  dapagliflozin propanediol (FARXIGA) 10 MG TABS tablet Take 1 tablet (10 mg total) by mouth daily before breakfast. 05/14/20  Yes Laurey Morale, MD  eplerenone (INSPRA) 50 MG tablet Take  0.5 tablets (25 mg total) by mouth daily. 10/31/19  Yes Laurey Morale, MD  furosemide (LASIX) 40 MG tablet Take 1 tablet (40 mg total) by mouth daily. 10/31/19  Yes Laurey Morale, MD  isosorbide-hydrALAZINE (BIDIL) 20-37.5 MG tablet Take 1.5 tablets by mouth 3 (three) times daily. Patient taking differently: Take 1 tablet by mouth 3 (three) times daily.  07/30/19  Yes Laurey Morale, MD  magnesium oxide (MAG-OX) 400 MG tablet Take 1 tablet (400 mg total) by mouth daily. 11/02/17  Yes Laurey Morale, MD  mometasone (NASONEX) 50 MCG/ACT nasal spray 2 sprays each nostril each AM Patient taking differently: Place 2 sprays into the nose as needed  (Allergies).  01/15/13  Yes Clance, Maree Krabbe, MD  potassium chloride SA (KLOR-CON) 20 MEQ tablet Take 1 tablet (20 mEq total) by mouth daily. 01/02/20  Yes Laurey Morale, MD  sacubitril-valsartan (ENTRESTO) 97-103 MG Take 1 tablet by mouth 2 (two) times daily. 09/12/19  Yes Laurey Morale, MD  sotalol (BETAPACE) 80 MG tablet Take 1 tablet (80 mg total) by mouth 2 (two) times daily. 05/08/20  Yes Laurey Morale, MD  methocarbamol (ROBAXIN) 500 MG tablet Take 1 tablet (500 mg total) by mouth 2 (two) times daily. 06/24/20   Mare Ferrari, PA-C    Allergies    Spironolactone  Review of Systems   Review of Systems  Constitutional: Negative for chills and fever.  HENT: Negative for ear pain and sore throat.   Eyes: Negative for pain and visual disturbance.  Respiratory: Negative for cough, chest tightness and shortness of breath.   Cardiovascular: Negative for chest pain, palpitations and leg swelling.  Gastrointestinal: Negative for abdominal pain and vomiting.  Genitourinary: Negative for dysuria and hematuria.  Musculoskeletal: Positive for back pain. Negative for arthralgias.  Skin: Negative for color change and rash.  Neurological: Negative for dizziness, seizures, syncope and weakness.  All other systems reviewed and are negative.   Physical Exam Updated Vital Signs BP 94/78 (BP Location: Right Arm)   Pulse (!) 59   Temp 98.6 F (37 C)   Resp 16   Ht 5\' 11"  (1.803 m)   Wt 120.2 kg   SpO2 98%   BMI 36.96 kg/m   Physical Exam Vitals and nursing note reviewed.  Constitutional:      General: He is not in acute distress.    Appearance: He is well-developed. He is obese. He is not ill-appearing, toxic-appearing or diaphoretic.  HENT:     Head: Normocephalic and atraumatic.  Eyes:     Conjunctiva/sclera: Conjunctivae normal.  Cardiovascular:     Rate and Rhythm: Normal rate and regular rhythm.     Heart sounds: No murmur heard.   Pulmonary:     Effort: Pulmonary effort  is normal. No respiratory distress.     Breath sounds: Normal breath sounds.  Abdominal:     Palpations: Abdomen is soft.     Tenderness: There is no abdominal tenderness.  Musculoskeletal:        General: Tenderness present. No swelling. Normal range of motion.       Arms:     Cervical back: Neck supple.     Right lower leg: No edema.     Left lower leg: No edema.     Comments: Left L-spine paraspinal muscle tenderness.  No C, T, L-spine midline tenderness.  No step-offs, crepitus.  Weaving all 4 extremities without difficulty.  Gross sensations intact.  Skin:  General: Skin is warm and dry.     Capillary Refill: Capillary refill takes less than 2 seconds.     Findings: No erythema or rash.  Neurological:     General: No focal deficit present.     Mental Status: He is alert.     Sensory: No sensory deficit.     Motor: No weakness.     ED Results / Procedures / Treatments   Labs (all labs ordered are listed, but only abnormal results are displayed) Labs Reviewed  COMPREHENSIVE METABOLIC PANEL - Abnormal; Notable for the following components:      Result Value   Glucose, Bld 112 (*)    Creatinine, Ser 1.46 (*)    Albumin 3.3 (*)    GFR calc non Af Amer 52 (*)    All other components within normal limits  CBC - Abnormal; Notable for the following components:   Platelets 143 (*)    All other components within normal limits  URINALYSIS, ROUTINE W REFLEX MICROSCOPIC - Abnormal; Notable for the following components:   Color, Urine STRAW (*)    Glucose, UA >=500 (*)    All other components within normal limits  LIPASE, BLOOD    EKG None  Radiology CT Renal Stone Study  Result Date: 06/24/2020 CLINICAL DATA:  Left flank pain EXAM: CT ABDOMEN AND PELVIS WITHOUT CONTRAST TECHNIQUE: Multidetector CT imaging of the abdomen and pelvis was performed following the standard protocol without IV contrast. COMPARISON:  None. FINDINGS: Lower chest: Patchy atelectasis at the lung  bases. Hepatobiliary: No focal liver abnormality is seen. No gallstones, gallbladder wall thickening, or biliary dilatation. Pancreas: Unremarkable. Spleen: Unremarkable. Adrenals/Urinary Tract: Adrenals, kidneys, bladder are unremarkable. Stomach/Bowel: Stomach is within normal limits. Bowel is normal caliber. Normal appendix. Vascular/Lymphatic: Aortic atherosclerosis. There are no enlarged lymph nodes identified Reproductive: Prostate is unremarkable. Other: Ascites.  Small fat containing left inguinal hernia. Musculoskeletal: No acute abnormality. IMPRESSION: No acute abnormality or findings to account for reported symptoms. Electronically Signed   By: Guadlupe Spanish M.D.   On: 06/24/2020 08:34    Procedures Procedures (including critical care time)  Medications Ordered in ED Medications  sodium chloride flush (NS) 0.9 % injection 3 mL (has no administration in time range)  acetaminophen (TYLENOL) tablet 650 mg (650 mg Oral Given 06/24/20 0848)    ED Course  I have reviewed the triage vital signs and the nursing notes.  Pertinent labs & imaging results that were available during my care of the patient were reviewed by me and considered in my medical decision making (see chart for details).    MDM Rules/Calculators/A&P                         59 year old male with 2 weeks of left-sided low back pain x2 weeks On presentation, the patient is alert and oriented, nontoxic-appearing, nondiaphoretic, speaking full sentences without increased work of breathing.  Physical exam with reproducible lumbar left-sided paraspinal tenderness.  He is moving all 4 extremities, no numbness or tingling, gross sensations intact.  No signs of cauda equina. No recent falls/ injuries, doubt fracture.  No fevers, chills, night sweats.  He denies any chest pain or shortness of breath.  I personally reviewed his lab work, which did not show any significant electrolyte abnormalities on CMP.  His creatinine is 1.46 which  per chart review appears to be improved from previous values.  Normal liver function test, normal bili.  Normal lipase.  CBC without leukocytosis.  UA with 0-5 RBCs, no evidence of UTI.  DDx includes low back strain, kidney stones, pyelo, bowel obstruction,  dissection.  Given 0-5 RBCs, discussed with Dr. Lynelle Doctor and we feel that a CT renal would be helpful in evaluating for kidney stones and evaluating for AAA.  Personally reviewed his CT scan which showed no evidence of this.  Suspect likely musculoskeletal strain.  Patient was given Tylenol here in the ED.  Will provide Robaxin, patient educated on sedating side effects and to take only at night and do not drink or drive on this medication.  Patient states that he does not have a PCP but is looking for 1.  I directed him to the phone number on his discharge paperwork.  Encouraged him to follow-up with the PCP if his symptoms do not improve.  Return precautions discussed.  Overall, the patient's questions have been answered to his satisfaction, he voices understanding and is agreeable to this plan.  At this stage in the ED course, he has been adequately screened and is stable for discharge.  Final Clinical Impression(s) / ED Diagnoses Final diagnoses:  Left flank pain    Rx / DC Orders ED Discharge Orders         Ordered    methocarbamol (ROBAXIN) 500 MG tablet  2 times daily     Discontinue  Reprint     06/24/20 0941               Mare Ferrari, PA-C 06/24/20 6160    Linwood Dibbles, MD 06/24/20 1537

## 2020-06-24 NOTE — ED Notes (Signed)
Pt discharge instructions and prescription reviewed with the patient. The patient verbalized understanding of instructions. Pt discharged. 

## 2020-06-24 NOTE — Discharge Instructions (Addendum)
   Your back pain should be treated with medicines such as Tylenol, and this back pain should get better over the next 2 weeks.  You may also take the prescribed muscle relaxer, please take this medication at night and do not drink or drive on this medication as it can make you sleepy.  However if you develop severe or worsening pain, low back pain with fever, numbness, weakness or inability to walk or urinate, you should return to the ER immediately.  Please call the phone number on your discharge paperwork in order to establish with a primary care doctor.  Please follow-up with your primary care doctor if your symptoms do not improve. Low back pain is discomfort in the lower back that may be due to injuries to muscles and ligaments around the spine.  Occasionally, it may be caused by a a problem to a part of the spine called a disc.  The pain may last several days or a week;  However, most patients get completely well in 4 weeks.

## 2020-06-26 ENCOUNTER — Other Ambulatory Visit (HOSPITAL_COMMUNITY): Payer: Self-pay | Admitting: Cardiology

## 2020-07-06 ENCOUNTER — Other Ambulatory Visit (HOSPITAL_COMMUNITY): Payer: Self-pay | Admitting: Cardiology

## 2020-07-15 ENCOUNTER — Encounter (HOSPITAL_COMMUNITY): Payer: Self-pay | Admitting: Cardiology

## 2020-07-15 ENCOUNTER — Telehealth (HOSPITAL_COMMUNITY): Payer: Self-pay | Admitting: Cardiology

## 2020-07-15 ENCOUNTER — Encounter (HOSPITAL_COMMUNITY): Payer: Medicaid Other | Admitting: Cardiology

## 2020-07-15 NOTE — Telephone Encounter (Signed)
No Show letter #1 sent, [facility]

## 2020-07-25 ENCOUNTER — Ambulatory Visit (INDEPENDENT_AMBULATORY_CARE_PROVIDER_SITE_OTHER): Payer: Medicaid Other | Admitting: Primary Care

## 2020-08-25 ENCOUNTER — Other Ambulatory Visit: Payer: Self-pay

## 2020-08-25 ENCOUNTER — Encounter (INDEPENDENT_AMBULATORY_CARE_PROVIDER_SITE_OTHER): Payer: Self-pay

## 2020-08-25 ENCOUNTER — Encounter (INDEPENDENT_AMBULATORY_CARE_PROVIDER_SITE_OTHER): Payer: Self-pay | Admitting: Primary Care

## 2020-08-25 ENCOUNTER — Ambulatory Visit (INDEPENDENT_AMBULATORY_CARE_PROVIDER_SITE_OTHER): Payer: Medicaid Other | Admitting: Primary Care

## 2020-08-25 VITALS — BP 109/75 | HR 74 | Temp 94.6°F | Ht 71.0 in | Wt 279.2 lb

## 2020-08-25 DIAGNOSIS — Z7689 Persons encountering health services in other specified circumstances: Secondary | ICD-10-CM | POA: Diagnosis not present

## 2020-08-25 DIAGNOSIS — Z1211 Encounter for screening for malignant neoplasm of colon: Secondary | ICD-10-CM

## 2020-08-25 DIAGNOSIS — I5022 Chronic systolic (congestive) heart failure: Secondary | ICD-10-CM | POA: Diagnosis not present

## 2020-08-25 DIAGNOSIS — Z13220 Encounter for screening for lipoid disorders: Secondary | ICD-10-CM

## 2020-08-25 DIAGNOSIS — Z125 Encounter for screening for malignant neoplasm of prostate: Secondary | ICD-10-CM

## 2020-08-25 DIAGNOSIS — Z23 Encounter for immunization: Secondary | ICD-10-CM | POA: Diagnosis not present

## 2020-08-25 DIAGNOSIS — Z114 Encounter for screening for human immunodeficiency virus [HIV]: Secondary | ICD-10-CM

## 2020-08-25 DIAGNOSIS — Z1159 Encounter for screening for other viral diseases: Secondary | ICD-10-CM

## 2020-08-25 DIAGNOSIS — Z131 Encounter for screening for diabetes mellitus: Secondary | ICD-10-CM | POA: Diagnosis not present

## 2020-08-25 LAB — POCT GLYCOSYLATED HEMOGLOBIN (HGB A1C): Hemoglobin A1C: 5.9 % — AB (ref 4.0–5.6)

## 2020-08-25 NOTE — Progress Notes (Signed)
New Patient Office Visit  Subjective:  Patient ID: Spencer Robertson, male    DOB: Mar 20, 1961  Age: 59 y.o. MRN: [id number]  CC:  Chief Complaint  Patient presents with   New Patient (Initial Visit)    pain on bottom of right foot     HPI Spencer Robertson presents for is a 59 year old obese male (Body mass index is 38.94 kg/m.) who presents to establish care and voices concerns with pain on the bottom of his right foot. Past Medical History:  Diagnosis Date   CHF (congestive heart failure) (HCC)    Hyperlipidemia    Hypertension    Mitral regurgitation    Nonischemic cardiomyopathy (HCC)    Obesity    Obesity (BMI 30-39.9) 09/01/2015   OSA (obstructive sleep apnea)    severe   S/P implantation of automatic cardioverter/defibrillator (AICD)    Guidant Contact H177   Tobacco abuse     Past Surgical History:  Procedure Laterality Date   CARDIAC DEFIBRILLATOR PLACEMENT     Guidant Contak H177 device     Family History  Problem Relation Age of Onset   Cardiomyopathy Father    Hypertension Other    Heart disease Sister    Heart disease Brother     Social History   Socioeconomic History   Marital status: Single    Spouse name: Not on file   Number of children: Not on file   Years of education: Not on file   Highest education level: Not on file  Occupational History   Occupation: disabled  Tobacco Use   Smoking status: Current Some Day Smoker    Packs/day: 0.25    Years: 20.00    Pack years: 5.00    Types: Cigarettes   Smokeless tobacco: Never Used  Building services engineer Use: Never used  Substance and Sexual Activity   Alcohol use: Yes    Comment: socially   Drug use: Yes    Types: Marijuana    Comment: occasional   Sexual activity: Not on file  Other Topics Concern   Not on file  Social History Narrative   Not on file   Social Determinants of Health   Financial Resource Strain:    Difficulty of Paying Living Expenses:  Not on file  Food Insecurity:    Worried About Programme researcher, broadcasting/film/video in the Last Year: Not on file   [employer] of Food in the Last Year: Not on file  Transportation Needs:    Lack of Transportation (Medical): Not on file   Lack of Transportation (Non-Medical): Not on file  Physical Activity:    Days of Exercise per Week: Not on file   Minutes of Exercise per Session: Not on file  Stress:    Feeling of Stress : Not on file  Social Connections:    Frequency of Communication with Friends and Family: Not on file   Frequency of Social Gatherings with Friends and Family: Not on file   Attends Religious Services: Not on file   Active Member of Clubs or Organizations: Not on file   Attends Banker Meetings: Not on file   Marital Status: Not on file  Intimate Partner Violence:    Fear of Current or Ex-Partner: Not on file   Emotionally Abused: Not on file   Physically Abused: Not on file   Sexually Abused: Not on file    ROS Review of Systems  Neurological: Positive for numbness.  Pain located on the bottom of his feet more towards the heel  All other systems reviewed and are negative.   Objective:   Today's Vitals: BP 109/75 (BP Location: Right Arm, Patient Position: Sitting, Cuff Size: Large)    Pulse 74    Temp (!) 94.6 F (34.8 C) (Temporal)    Ht 5\' 11"  (1.803 m)    Wt 279 lb 3.2 oz (126.6 kg)    SpO2 95%    BMI 38.94 kg/m   Physical Exam General: Vital signs reviewed.  Patient is well-developed and well-nourished, obese male in no acute distress and cooperative with exam.  Head: Normocephalic and atraumatic. Eyes: EOMI, conjunctivae normal, no scleral icterus.  Neck: Supple, trachea midline, normal ROM, no JVD, masses, thyromegaly, or carotid bruit present.  Cardiovascular: RRR, S1 normal, S2 normal, no murmurs, gallops, or rubs. Pulmonary/Chest: Clear to auscultation bilaterally, no wheezes, rales, or rhonchi. Abdominal: Soft, non-tender,  non-distended, BS +, no masses, organomegaly, or guarding present.  Musculoskeletal: No joint deformities, erythema, or stiffness, ROM full and nontender. Extremities: No lower extremity edema bilaterally,  pulses symmetric and intact bilaterally. No cyanosis or clubbing. Neurological: A&O x3, Strength is normal and symmetric bilaterally,  Skin: Warm, dry and intact. No rashes or erythema. Psychiatric: Normal mood and affect. speech and behavior is normal. Cognition and memory are normal.  Assessment & Plan:   Adem was seen today for new patient (initial visit).  Diagnoses and all orders for this visit:  Encounter to establish care Chrissie Noa, NP-C will be your  (PCP) she is mastered prepared . Able to diagnosed and treatment also  answer health concern as well as continuing care of varied medical conditions, not limited by cause, organ system, or diagnosis.   Screening for diabetes mellitus -     HgB A1c  Need for Tdap vaccination Tdap is recommended every 10 years for adults  -     Tdap vaccine greater than or equal to 7yo IM  SYSTOLIC HEART FAILURE, CHRONIC Followed by cardiology Dr. Gwinda Passe  Screening for cholesterol level -     Lipid panel; Future  Colon cancer screening -     Ambulatory referral to Gastroenterology  Encounter for screening for HIV Health maintenance and care gap.  -     HIV antibody (with reflex); Future  Encounter for hepatitis C screening test for low risk patient Health maintenance and care gap. -     Hepatitis C Antibody; Future  Prostate cancer screening Health maintenance and care gap. -     PSA; Future    Outpatient Encounter Medications as of 08/25/2020  Medication Sig   aspirin 81 MG tablet Take 81 mg by mouth daily.     carvedilol (COREG) 25 MG tablet TAKE 1 TABLET BY MOUTH TWICE DAILY WITH A MEAL (Patient taking differently: Take 25 mg by mouth 2 (two) times daily with a meal. )   cetirizine (ZYRTEC) 10 MG chewable  tablet Chew 10 mg by mouth daily.   dapagliflozin propanediol (FARXIGA) 10 MG TABS tablet Take 1 tablet (10 mg total) by mouth daily before breakfast.   eplerenone (INSPRA) 50 MG tablet Take 0.5 tablets (25 mg total) by mouth daily.   furosemide (LASIX) 40 MG tablet TAKE 1 TABLET BY MOUTH EVERY MORNING AND ONE-HALF TABLET EVERY EVENING   isosorbide-hydrALAZINE (BIDIL) 20-37.5 MG tablet Take 1.5 tablets by mouth 3 (three) times daily. (Patient taking differently: Take 1 tablet by mouth 3 (three) times daily. )  mometasone (NASONEX) 50 MCG/ACT nasal spray 2 sprays each nostril each AM (Patient taking differently: Place 2 sprays into the nose as needed (Allergies). )   potassium chloride SA (KLOR-CON) 20 MEQ tablet TAKE 1 TABLET(20 MEQ) BY MOUTH DAILY   sacubitril-valsartan (ENTRESTO) 97-103 MG Take 1 tablet by mouth 2 (two) times daily.   sotalol (BETAPACE) 80 MG tablet Take 1 tablet (80 mg total) by mouth 2 (two) times daily.   [DISCONTINUED] magnesium oxide (MAG-OX) 400 MG tablet Take 1 tablet (400 mg total) by mouth daily.   [DISCONTINUED] methocarbamol (ROBAXIN) 500 MG tablet Take 1 tablet (500 mg total) by mouth 2 (two) times daily.   No facility-administered encounter medications on file as of 08/25/2020.    Follow-up: Return in about 6 months (around 02/22/2021) for Prediabetes, in person.   Grayce Sessions, NP

## 2020-08-25 NOTE — Patient Instructions (Addendum)
https://www.cdc.gov/vaccines/hcp/vis/vis-statements/tdap.pdf">  Tdap (Tetanus, Diphtheria, Pertussis) Vaccine: What You Need to Know 1. Why get vaccinated? Tdap vaccine can prevent tetanus, diphtheria, and pertussis. Diphtheria and pertussis spread from person to person. Tetanus enters the body through cuts or wounds.  TETANUS (T) causes painful stiffening of the muscles. Tetanus can lead to serious health problems, including being unable to open the mouth, having trouble swallowing and breathing, or death.  DIPHTHERIA (D) can lead to difficulty breathing, heart failure, paralysis, or death.  PERTUSSIS (aP), also known as "whooping cough," can cause uncontrollable, violent coughing which makes it hard to breathe, eat, or drink. Pertussis can be extremely serious in babies and young children, causing pneumonia, convulsions, brain damage, or death. In teens and adults, it can cause weight loss, loss of bladder control, passing out, and rib fractures from severe coughing. 2. Tdap vaccine Tdap is only for children 7 years and older, adolescents, and adults.  Adolescents should receive a single dose of Tdap, preferably at age 53 or 35 years. Pregnant women should get a dose of Tdap during every pregnancy, to protect the newborn from pertussis. Infants are most at risk for severe, life-threatening complications from pertussis. Adults who have never received Tdap should get a dose of Tdap. Also, adults should receive a booster dose every 10 years, or earlier in the case of a severe and dirty wound or burn. Booster doses can be either Tdap or Td (a different vaccine that protects against tetanus and diphtheria but not pertussis). Tdap may be given at the same time as other vaccines. 3. Talk with your health care provider Tell your vaccine provider if the person getting the vaccine:  Has had an allergic reaction after a previous dose of any vaccine that protects against tetanus, diphtheria, or pertussis,  or has any severe, life-threatening allergies.  Has had a coma, decreased level of consciousness, or prolonged seizures within 7 days after a previous dose of any pertussis vaccine (DTP, DTaP, or Tdap).  Has seizures or another nervous system problem.  Has ever had Guillain-Barr Syndrome (also called GBS).  Has had severe pain or swelling after a previous dose of any vaccine that protects against tetanus or diphtheria. In some cases, your health care provider may decide to postpone Tdap vaccination to a future visit.  People with minor illnesses, such as a cold, may be vaccinated. People who are moderately or severely ill should usually wait until they recover before getting Tdap vaccine.  Your health care provider can give you more information. 4. Risks of a vaccine reaction  Pain, redness, or swelling where the shot was given, mild fever, headache, feeling tired, and nausea, vomiting, diarrhea, or stomachache sometimes happen after Tdap vaccine. People sometimes faint after medical procedures, including vaccination. Tell your provider if you feel dizzy or have vision changes or ringing in the ears.  As with any medicine, there is a very remote chance of a vaccine causing a severe allergic reaction, other serious injury, or death. 5. What if there is a serious problem? An allergic reaction could occur after the vaccinated person leaves the clinic. If you see signs of a severe allergic reaction (hives, swelling of the face and throat, difficulty breathing, a fast heartbeat, dizziness, or weakness), call 9-1-1 and get the person to the nearest hospital. For other signs that concern you, call your health care provider.  Adverse reactions should be reported to the Vaccine Adverse Event Reporting System (VAERS). Your health care provider will usually file this report,  or you can do it yourself. Visit the VAERS website at www.vaers.SamedayNews.es or call (816)266-5606. VAERS is only for reporting  reactions, and VAERS staff do not give medical advice. 6. The National Vaccine Injury Compensation Program The [employer] Vaccine Injury Compensation Program (VICP) is a federal program that was created to compensate people who may have been injured by certain vaccines. Visit the VICP website at GoldCloset.com.ee or call 847-088-0378 to learn about the program and about filing a claim. There is a time limit to file a claim for compensation. 7. How can I learn more?  Ask your health care provider.  Call your local or state health department.  Contact the Centers for Disease Control and Prevention (CDC): ? Call 224-436-2351 (1-800-CDC-INFO) or ? Visit CDC's website at http://hunter.com/ Vaccine Information Statement Tdap (Tetanus, Diphtheria, Pertussis) Vaccine (03/07/2019) This information is not intended to replace advice given to you by your health care provider. Make sure you discuss any questions you have with your health care provider. Document Revised: 03/16/2019 Document Reviewed: 03/19/2019 Elsevier Patient Education  2020 [employer]. Influenza, Adult Influenza is also called "the flu." It is an infection in the lungs, nose, and throat (respiratory tract). It is caused by a virus. The flu causes symptoms that are similar to symptoms of a cold. It also causes a high fever and body aches. The flu spreads easily from person to person (is contagious). Getting a flu shot (influenza vaccination) every year is the best way to prevent the flu. What are the causes? This condition is caused by the influenza virus. You can get the virus by:  Breathing in droplets that are in the air from the cough or sneeze of a person who has the virus.  Touching something that has the virus on it (is contaminated) and then touching your mouth, nose, or eyes. What increases the risk? Certain things may make you more likely to get the flu. These include:  Not washing your hands  often.  Having close contact with many people during cold and flu season.  Touching your mouth, eyes, or nose without first washing your hands.  Not getting a flu shot every year. You may have a higher risk for the flu, along with serious problems such as a lung infection (pneumonia), if you:  Are older than 65.  Are pregnant.  Have a weakened disease-fighting system (immune system) because of a disease or taking certain medicines.  Have a long-term (chronic) illness, such as: ? Heart, kidney, or lung disease. ? Diabetes. ? Asthma.  Have a liver disorder.  Are very overweight (morbidly obese).  Have anemia. This is a condition that affects your red blood cells. What are the signs or symptoms? Symptoms usually begin suddenly and last 4-14 days. They may include:  Fever and chills.  Headaches, body aches, or muscle aches.  Sore throat.  Cough.  Runny or stuffy (congested) nose.  Chest discomfort.  Not wanting to eat as much as normal (poor appetite).  Weakness or feeling tired (fatigue).  Dizziness.  Feeling sick to your stomach (nauseous) or throwing up (vomiting). How is this treated? If the flu is found early, you can be treated with medicine that can help reduce how bad the illness is and how long it lasts (antiviral medicine). This may be given by mouth (orally) or through an IV tube. Taking care of yourself at home can help your symptoms get better. Your doctor may suggest:  Taking over-the-counter medicines.  Drinking plenty of fluids. The  flu often goes away on its own. If you have very bad symptoms or other problems, you may be treated in a hospital. Follow these instructions at home:     Activity  Rest as needed. Get plenty of sleep.  Stay home from work or school as told by your doctor. ? Do not leave home until you do not have a fever for 24 hours without taking medicine. ? Leave home only to visit your doctor. Eating and drinking  Take an  ORS (oral rehydration solution). This is a drink that is sold at pharmacies and stores.  Drink enough fluid to keep your pee (urine) pale yellow.  Drink clear fluids in small amounts as you are able. Clear fluids include: ? Water. ? Ice chips. ? Fruit juice that has water added (diluted fruit juice). ? Low-calorie sports drinks.  Eat bland, easy-to-digest foods in small amounts as you are able. These foods include: ? Bananas. ? Applesauce. ? Rice. ? Lean meats. ? Toast. ? Crackers.  Do not eat or drink: ? Fluids that have a lot of sugar or caffeine. ? Alcohol. ? Spicy or fatty foods. General instructions  Take over-the-counter and prescription medicines only as told by your doctor.  Use a cool mist humidifier to add moisture to the air in your home. This can make it easier for you to breathe.  Cover your mouth and nose when you cough or sneeze.  Wash your hands with soap and water often, especially after you cough or sneeze. If you cannot use soap and water, use alcohol-based hand sanitizer.  Keep all follow-up visits as told by your doctor. This is important. How is this prevented?   Get a flu shot every year. You may get the flu shot in late summer, fall, or winter. Ask your doctor when you should get your flu shot.  Avoid contact with people who are sick during fall and winter (cold and flu season). Contact a doctor if:  You get new symptoms.  You have: ? Chest pain. ? Watery poop (diarrhea). ? A fever.  Your cough gets worse.  You start to have more mucus.  You feel sick to your stomach.  You throw up. Get help right away if you:  Have shortness of breath.  Have trouble breathing.  Have skin or nails that turn a bluish color.  Have very bad pain or stiffness in your neck.  Get a sudden headache.  Get sudden pain in your face or ear.  Cannot eat or drink without throwing up. Summary  Influenza ("the flu") is an infection in the lungs, nose,  and throat. It is caused by a virus.  Take over-the-counter and prescription medicines only as told by your doctor.  Getting a flu shot every year is the best way to avoid getting the flu. This information is not intended to replace advice given to you by your health care provider. Make sure you discuss any questions you have with your health care provider. Document Revised: 05/10/2018 Document Reviewed: 05/10/2018 Elsevier Patient Education  2020 [employer]  Diabetes Basics  Diabetes (diabetes mellitus) is a long-term (chronic) disease. It occurs when the body does not properly use sugar (glucose) that is released from food after you eat. Diabetes may be caused by one or both of these problems:  Your pancreas does not make enough of a hormone called insulin.  Your body does not react in a normal way to insulin that it makes. Insulin lets sugars (glucose)  go into cells in your body. This gives you energy. If you have diabetes, sugars cannot get into cells. This causes high blood sugar (hyperglycemia). Follow these instructions at home: How is diabetes treated? You may need to take insulin or other diabetes medicines daily to keep your blood sugar in balance. Take your diabetes medicines every day as told by your doctor. List your diabetes medicines here: Diabetes medicines  Name of medicine: ______________________________ ? Amount (dose): _______________ Time (a.m./p.m.): _______________ Notes: ___________________________________  Name of medicine: ______________________________ ? Amount (dose): _______________ Time (a.m./p.m.): _______________ Notes: ___________________________________  Name of medicine: ______________________________ ? Amount (dose): _______________ Time (a.m./p.m.): _______________ Notes: ___________________________________ If you use insulin, you will learn how to give yourself insulin by injection. You may need to adjust the amount based on the food that you  eat. List the types of insulin you use here: Insulin  Insulin type: ______________________________ ? Amount (dose): _______________ Time (a.m./p.m.): _______________ Notes: ___________________________________  Insulin type: ______________________________ ? Amount (dose): _______________ Time (a.m./p.m.): _______________ Notes: ___________________________________  Insulin type: ______________________________ ? Amount (dose): _______________ Time (a.m./p.m.): _______________ Notes: ___________________________________  Insulin type: ______________________________ ? Amount (dose): _______________ Time (a.m./p.m.): _______________ Notes: ___________________________________  Insulin type: ______________________________ ? Amount (dose): _______________ Time (a.m./p.m.): _______________ Notes: ___________________________________ How do I manage my blood sugar?  Check your blood sugar levels using a blood glucose monitor as directed by your doctor. Your doctor will set treatment goals for you. Generally, you should have these blood sugar levels:  Before meals (preprandial): 80-130 mg/dL (7.8-2.9 mmol/L).  After meals (postprandial): below 180 mg/dL (10 mmol/L).  A1c level: less than 7%. Write down the times that you will check your blood sugar levels: Blood sugar checks  Time: _______________ Notes: ___________________________________  Time: _______________ Notes: ___________________________________  Time: _______________ Notes: ___________________________________  Time: _______________ Notes: ___________________________________  Time: _______________ Notes: ___________________________________  Time: _______________ Notes: ___________________________________  What do I need to know about low blood sugar? Low blood sugar is called hypoglycemia. This is when blood sugar is at or below 70 mg/dL (3.9 mmol/L). Symptoms may include:  Feeling: ? Hungry. ? Worried or nervous  (anxious). ? Sweaty and clammy. ? Confused. ? Dizzy. ? Sleepy. ? Sick to your stomach (nauseous).  Having: ? A fast heartbeat. ? A headache. ? A change in your vision. ? Tingling or no feeling (numbness) around the mouth, lips, or tongue. ? Jerky movements that you cannot control (seizure).  Having trouble with: ? Moving (coordination). ? Sleeping. ? Passing out (fainting). ? Getting upset easily (irritability). Treating low blood sugar To treat low blood sugar, eat or drink something sugary right away. If you can think clearly and swallow safely, follow the 15:15 rule:  Take 15 grams of a fast-acting carb (carbohydrate). Talk with your doctor about how much you should take.  Some fast-acting carbs are: ? Sugar tablets (glucose pills). Take 3-4 glucose pills. ? 6-8 pieces of hard candy. ? 4-6 oz (120-150 mL) of fruit juice. ? 4-6 oz (120-150 mL) of regular (not diet) soda. ? 1 Tbsp (15 mL) honey or sugar.  Check your blood sugar 15 minutes after you take the carb.  If your blood sugar is still at or below 70 mg/dL (3.9 mmol/L), take 15 grams of a carb again.  If your blood sugar does not go above 70 mg/dL (3.9 mmol/L) after 3 tries, get help right away.  After your blood sugar goes back to normal, eat a meal or a snack within 1 hour. Treating very  low blood sugar If your blood sugar is at or below 54 mg/dL (3 mmol/L), you have very low blood sugar (severe hypoglycemia). This is an emergency. Do not wait to see if the symptoms will go away. Get medical help right away. Call your local emergency services (911 in the [place] Do not drive yourself to the hospital. Questions to ask your health care provider  Do I need to meet with a diabetes educator?  What equipment will I need to care for myself at home?  What diabetes medicines do I need? When should I take them?  How often do I need to check my blood sugar?  What number can I call if I have questions?  When is my  next doctor's visit?  Where can I find a support group for people with diabetes? Where to find more information  American Diabetes Association: www.diabetes.org  American Association of Diabetes Educators: www.diabeteseducator.org/patient-resources Contact a doctor if:  Your blood sugar is at or above 240 mg/dL (58.5 mmol/L) for 2 days in a row.  You have been sick or have had a fever for 2 days or more, and you are not getting better.  You have any of these problems for more than 6 hours: ? You cannot eat or drink. ? You feel sick to your stomach (nauseous). ? You throw up (vomit). ? You have watery poop (diarrhea). Get help right away if:  Your blood sugar is lower than 54 mg/dL (3 mmol/L).  You get confused.  You have trouble: ? Thinking clearly. ? Breathing. Summary  Diabetes (diabetes mellitus) is a long-term (chronic) disease. It occurs when the body does not properly use sugar (glucose) that is released from food after digestion.  Take insulin and diabetes medicines as told.  Check your blood sugar every day, as often as told.  Keep all follow-up visits as told by your doctor. This is important. This information is not intended to replace advice given to you by your health care provider. Make sure you discuss any questions you have with your health care provider. Document Revised: 08/15/2019 Document Reviewed: 02/24/2018 Elsevier Patient Education  2020 [employer].

## 2020-08-29 ENCOUNTER — Other Ambulatory Visit (INDEPENDENT_AMBULATORY_CARE_PROVIDER_SITE_OTHER): Payer: Medicaid Other

## 2020-08-29 ENCOUNTER — Ambulatory Visit (INDEPENDENT_AMBULATORY_CARE_PROVIDER_SITE_OTHER): Payer: Medicaid Other

## 2020-08-29 ENCOUNTER — Other Ambulatory Visit: Payer: Self-pay

## 2020-08-29 DIAGNOSIS — Z23 Encounter for immunization: Secondary | ICD-10-CM

## 2020-08-29 DIAGNOSIS — Z125 Encounter for screening for malignant neoplasm of prostate: Secondary | ICD-10-CM

## 2020-08-29 DIAGNOSIS — Z114 Encounter for screening for human immunodeficiency virus [HIV]: Secondary | ICD-10-CM

## 2020-08-29 DIAGNOSIS — Z1159 Encounter for screening for other viral diseases: Secondary | ICD-10-CM

## 2020-08-29 DIAGNOSIS — Z13220 Encounter for screening for lipoid disorders: Secondary | ICD-10-CM

## 2020-08-30 LAB — PSA: Prostate Specific Ag, Serum: 1.2 ng/mL (ref 0.0–4.0)

## 2020-08-30 LAB — LIPID PANEL
Chol/HDL Ratio: 5.2 ratio — ABNORMAL HIGH (ref 0.0–5.0)
Cholesterol, Total: 134 mg/dL (ref 100–199)
HDL: 26 mg/dL — ABNORMAL LOW (ref >39)
LDL Chol Calc (NIH): 56 mg/dL (ref 0–99)
Triglycerides: 337 mg/dL — ABNORMAL HIGH (ref 0–149)
VLDL Cholesterol Cal: 52 mg/dL — ABNORMAL HIGH (ref 5–40)

## 2020-08-30 LAB — HIV ANTIBODY (ROUTINE TESTING W REFLEX): HIV Screen 4th Generation wRfx: NONREACTIVE

## 2020-08-30 LAB — HEPATITIS C ANTIBODY: Hep C Virus Ab: <0.1 s/co ratio (ref 0.0–0.9)

## 2020-09-01 ENCOUNTER — Other Ambulatory Visit (INDEPENDENT_AMBULATORY_CARE_PROVIDER_SITE_OTHER): Payer: Self-pay | Admitting: Primary Care

## 2020-09-01 DIAGNOSIS — E785 Hyperlipidemia, unspecified: Secondary | ICD-10-CM

## 2020-09-01 MED ORDER — ATORVASTATIN CALCIUM 80 MG PO TABS: 80.0000 mg | ORAL_TABLET | Freq: Every day | ORAL | 3 refills | Status: DC

## 2020-09-05 ENCOUNTER — Telehealth (INDEPENDENT_AMBULATORY_CARE_PROVIDER_SITE_OTHER): Payer: Self-pay

## 2020-09-05 NOTE — Telephone Encounter (Signed)
Patient verified date of birth. He is aware of normal PSA HIV and hepatitis. He was informed that his cholesterol is elevated and can lead to stroke or heart attack. Informed him that atorvastatin 80 mg to help lower cholesterol has been sent to the pharmacy. Advised patient to take at night and decrease fatty foods in his diet. He verbalized understanding. Spencer Robertson, CMA

## 2020-09-05 NOTE — Telephone Encounter (Signed)
-----   Message from Grayce Sessions, NP sent at 09/01/2020  3:37 PM EDT ----- PSA is normal, HIV r and hepatitis results are negative. Cholesterol is elevated medication sent in atorvastatin 80 mg take at bedtime decrease fatty foods in your diet.  This can lead to increase in having a stroke or heart attack

## 2020-09-09 ENCOUNTER — Other Ambulatory Visit (HOSPITAL_COMMUNITY): Payer: Self-pay | Admitting: Cardiology

## 2020-09-12 ENCOUNTER — Encounter (HOSPITAL_COMMUNITY): Payer: Medicaid Other | Admitting: Cardiology

## 2020-09-20 ENCOUNTER — Other Ambulatory Visit (HOSPITAL_COMMUNITY): Payer: Self-pay | Admitting: Cardiology

## 2020-09-23 ENCOUNTER — Telehealth (HOSPITAL_COMMUNITY): Payer: Self-pay | Admitting: Cardiology

## 2020-09-23 NOTE — Telephone Encounter (Signed)
Called pt back to see which medication he needed a refill on. No answer/left vm requesting return call.

## 2020-09-23 NOTE — Telephone Encounter (Signed)
Pt called to f/u with refill request, pt stated prior auth for refill was faxed in from pharmacist, please advise

## 2020-09-28 ENCOUNTER — Other Ambulatory Visit (HOSPITAL_COMMUNITY): Payer: Self-pay | Admitting: Cardiology

## 2020-09-30 ENCOUNTER — Other Ambulatory Visit: Payer: Self-pay

## 2020-09-30 ENCOUNTER — Ambulatory Visit (HOSPITAL_COMMUNITY)
Admission: RE | Admit: 2020-09-30 | Discharge: 2020-09-30 | Disposition: A | Discharge status: Home / Self Care | Payer: Medicaid Other | Source: Ambulatory Visit | Attending: Cardiology | Admitting: Cardiology

## 2020-09-30 ENCOUNTER — Encounter (HOSPITAL_COMMUNITY): Payer: Self-pay | Admitting: Cardiology

## 2020-09-30 ENCOUNTER — Encounter: Payer: Self-pay | Admitting: Gastroenterology

## 2020-09-30 VITALS — BP 124/82 | HR 60 | Wt 278.2 lb

## 2020-09-30 DIAGNOSIS — I471 Supraventricular tachycardia: Secondary | ICD-10-CM | POA: Diagnosis not present

## 2020-09-30 DIAGNOSIS — I5022 Chronic systolic (congestive) heart failure: Secondary | ICD-10-CM

## 2020-09-30 DIAGNOSIS — F17210 Nicotine dependence, cigarettes, uncomplicated: Secondary | ICD-10-CM | POA: Insufficient documentation

## 2020-09-30 DIAGNOSIS — G4737 Central sleep apnea in conditions classified elsewhere: Secondary | ICD-10-CM | POA: Diagnosis not present

## 2020-09-30 DIAGNOSIS — Z79899 Other long term (current) drug therapy: Secondary | ICD-10-CM | POA: Diagnosis not present

## 2020-09-30 LAB — BASIC METABOLIC PANEL WITH GFR
Anion gap: 10 (ref 5–15)
BUN: 18 mg/dL (ref 6–20)
CO2: 23 mmol/L (ref 22–32)
Calcium: 9.7 mg/dL (ref 8.9–10.3)
Chloride: 101 mmol/L (ref 98–111)
Creatinine, Ser: 1.35 mg/dL — ABNORMAL HIGH (ref 0.61–1.24)
GFR, Estimated: >60 mL/min (ref >60)
Glucose, Bld: 97 mg/dL (ref 70–99)
Potassium: 3.9 mmol/L (ref 3.5–5.1)
Sodium: 134 mmol/L — ABNORMAL LOW (ref 135–145)

## 2020-09-30 MED ORDER — BUPROPION HCL ER (SR) 150 MG PO TB12
ORAL_TABLET | ORAL | 3 refills | Status: DC
Start: 2020-09-30 — End: 2021-10-26

## 2020-09-30 MED ORDER — BIDIL 20-37.5 MG PO TABS: 2.0000 | ORAL_TABLET | Freq: Three times a day (TID) | ORAL | 6 refills | Status: DC

## 2020-09-30 NOTE — Patient Instructions (Addendum)
Increase Bidil to 2 tabs Three times a day   Start Wellbutrin 150 mg Daily FOR 3 DAYS ONLY, then increase to 150 mg Twice daily   Labs done today we will call you for abnormal results  You have been referred to follow up with Dr Graciela Husbands AS SOON AS POSSIBLE, his office will call you for an appointment  Your physician recommends that you schedule a follow-up appointment in: 4 months with echocardiogram  If you have any questions or concerns before your next appointment please send [place] a message through [facility] or call our office at [phone].    TO LEAVE A MESSAGE FOR THE NURSE SELECT OPTION 2, PLEASE LEAVE A MESSAGE INCLUDING: . YOUR NAME . DATE OF BIRTH . CALL BACK NUMBER . REASON FOR CALL**this is important as we prioritize the call backs  YOU WILL RECEIVE A CALL BACK THE SAME DAY AS LONG AS YOU CALL BEFORE 4:00 PM  At [facility], you and your health needs are our priority. As part of our continuing mission to provide you with exceptional heart care, we have created designated Provider Care Teams. These Care Teams include your primary Cardiologist (physician) and Advanced Practice Providers (APPs- Physician Assistants and Nurse Practitioners) who all work together to provide you with the care you need, when you need it.   You may see any of the following providers on your designated Care Team at your next follow up: Marland Kitchen Dr Arvilla Meres . Dr Marca Ancona . Tonye Becket, NP . Robbie Lis, PA . Karle Plumber, PharmD   Please be sure to bring in all your medications bottles to every appointment.

## 2020-10-01 ENCOUNTER — Other Ambulatory Visit (HOSPITAL_COMMUNITY): Payer: Self-pay | Admitting: Cardiology

## 2020-10-01 ENCOUNTER — Telehealth: Payer: Self-pay

## 2020-10-01 NOTE — H&P (View-Only) (Signed)
Electrophysiology Office Note Date: 10/02/2020  ID:  Spencer Robertson, Spencer Robertson October 07, 1961, MRN [id number]  PCP: Grayce Sessions, NP Primary Cardiologist: No primary care provider on file. Electrophysiologist: Sherryl Manges, MD   CC: Routine ICD follow-up  Spencer Robertson is a 59 y.o. male seen today for Sherryl Manges, MD for routine electrophysiology followup and having reached RRT with his device. Since last being seen in our clinic the patient reports doing well overall. He reports he has been trying to send his remotes as scheduled, but we have not received them. He is not sure if he has received our letters. he denies chest pain, palpitations, dyspnea, PND, orthopnea, nausea, vomiting, dizziness, syncope, edema, weight gain, or early satiety. He has not had ICD shocks.   Device History: BSCi CRT-D, gen change 08/05/10, originally implanted 2005, Dr. Graciela Husbands AAD: sotalol added to improve DFT's  Past Medical History:  Diagnosis Date  . CHF (congestive heart failure) (HCC)   . Hyperlipidemia   . Hypertension   . Mitral regurgitation   . Nonischemic cardiomyopathy (HCC)   . Obesity   . Obesity (BMI 30-39.9) 09/01/2015  . OSA (obstructive sleep apnea)    severe  . S/P implantation of automatic cardioverter/defibrillator (AICD)    Guidant Contact H177  . Tobacco abuse    Past Surgical History:  Procedure Laterality Date  . CARDIAC DEFIBRILLATOR PLACEMENT     Guidant Contak H177 device     Current Outpatient Medications  Medication Sig Dispense Refill  . aspirin 81 MG tablet Take 81 mg by mouth daily.      Marland Kitchen atorvastatin (LIPITOR) 80 MG tablet Take 1 tablet (80 mg total) by mouth daily. 90 tablet 3  . buPROPion (WELLBUTRIN SR) 150 MG 12 hr tablet Take 1 tablet (150 mg total) by mouth daily for 3 days, THEN 1 tablet (150 mg total) 2 (two) times daily. 60 tablet 3  . carvedilol (COREG) 25 MG tablet TAKE 1 TABLET BY MOUTH TWICE DAILY WITH A MEAL 180 tablet 3  . cetirizine (ZYRTEC) 10  MG chewable tablet Chew 10 mg by mouth daily.    . dapagliflozin propanediol (FARXIGA) 10 MG TABS tablet Take 1 tablet (10 mg total) by mouth daily before breakfast. 90 tablet 3  . ENTRESTO 97-103 MG TAKE 1 TABLET BY MOUTH TWICE DAILY 180 tablet 3  . eplerenone (INSPRA) 50 MG tablet TAKE 1 TABLET(50 MG) BY MOUTH DAILY 30 tablet 11  . furosemide (LASIX) 40 MG tablet TAKE 1 TABLET BY MOUTH EVERY MORNING AND ONE-HALF TABLET EVERY EVENING 90 tablet 3  . isosorbide-hydrALAZINE (BIDIL) 20-37.5 MG tablet Take 2 tablets by mouth 3 (three) times daily. 180 tablet 6  . mometasone (NASONEX) 50 MCG/ACT nasal spray Place 2 sprays into the nose as needed.    . potassium chloride SA (KLOR-CON) 20 MEQ tablet TAKE 1 TABLET(20 MEQ) BY MOUTH DAILY 90 tablet 1  . sotalol (BETAPACE) 80 MG tablet Take 1 tablet (80 mg total) by mouth 2 (two) times daily. 180 tablet 3   No current facility-administered medications for this visit.    Allergies:   Spironolactone   Social History: Social History   Socioeconomic History  . Marital status: Single    Spouse name: Not on file  . Number of children: Not on file  . Years of education: Not on file  . Highest education level: Not on file  Occupational History  . Occupation: disabled  Tobacco Use  . Smoking status: Current Some  Day Smoker    Packs/day: 0.25    Years: 20.00    Pack years: 5.00    Types: Cigarettes  . Smokeless tobacco: Never Used  Vaping Use  . Vaping Use: Never used  Substance and Sexual Activity  . Alcohol use: Yes    Comment: socially  . Drug use: Yes    Types: Marijuana    Comment: occasional  . Sexual activity: Not on file  Other Topics Concern  . Not on file  Social History Narrative  . Not on file   Social Determinants of Health   Financial Resource Strain:   . Difficulty of Paying Living Expenses: Not on file  Food Insecurity:   . Worried About Programme researcher, broadcasting/film/video in the Last Year: Not on file  . Ran Out of Food in the Last  Year: Not on file  Transportation Needs:   . Lack of Transportation (Medical): Not on file  . Lack of Transportation (Non-Medical): Not on file  Physical Activity:   . Days of Exercise per Week: Not on file  . Minutes of Exercise per Session: Not on file  Stress:   . Feeling of Stress : Not on file  Social Connections:   . Frequency of Communication with Friends and Family: Not on file  . Frequency of Social Gatherings with Friends and Family: Not on file  . Attends Religious Services: Not on file  . Active Member of Clubs or Organizations: Not on file  . Attends Banker Meetings: Not on file  . Marital Status: Not on file  Intimate Partner Violence:   . Fear of Current or Ex-Partner: Not on file  . Emotionally Abused: Not on file  . Physically Abused: Not on file  . Sexually Abused: Not on file    Family History: Family History  Problem Relation Age of Onset  . Cardiomyopathy Father   . Hypertension Other   . Heart disease Sister   . Heart disease Brother     Review of Systems: All other systems reviewed and are otherwise negative except as noted above.   Physical Exam: Vitals:   10/02/20 1150  BP: 128/68  Pulse: 72  SpO2: 95%  Weight: 277 lb (125.6 kg)  Height: 5\' 11"  (1.803 m)     GEN- The patient is well appearing, alert and oriented x 3 today.   HEENT: normocephalic, atraumatic; sclera clear, conjunctiva pink; hearing intact; oropharynx clear; neck supple, no JVP Lymph- no cervical lymphadenopathy Lungs- Clear to ausculation bilaterally, normal work of breathing.  No wheezes, rales, rhonchi Heart- Regular rate and rhythm, no murmurs, rubs or gallops, PMI not laterally displaced GI- soft, non-tender, non-distended, bowel sounds present, no hepatosplenomegaly Extremities- no clubbing or cyanosis. No edema; DP/PT/radial pulses 2+ bilaterally MS- no significant deformity or atrophy Skin- warm and dry, no rash or lesion; ICD pocket well  healed Psych- euthymic mood, full affect Neuro- strength and sensation are intact  ICD interrogation- reviewed in detail today,  See PACEART report  EKG:  EKG is not ordered today.  Recent Labs: 10/29/2019: Magnesium 1.9 06/23/2020: ALT 13; Hemoglobin 15.0; Platelets 143 09/30/2020: BUN 18; Creatinine, Ser 1.35; Potassium 3.9; Sodium 134   Wt Readings from Last 3 Encounters:  10/02/20 277 lb (125.6 kg)  09/30/20 278 lb 3.2 oz (126.2 kg)  08/25/20 279 lb 3.2 oz (126.6 kg)     Other studies Reviewed: Additional studies/ records that were reviewed today include: Previous EP office notes, CHF notes.  Assessment and Plan:  1.  Chronic systolic dysfunction s/p [employer] CRT-D  euvolemic today Stable on an appropriate medical regimen. Follows with Dr. Shirlee Latch Normal [facility] at [facility]. Will schedule for gen change with Dr. Graciela Husbands. See Arita Miss Art report No changes today Explained risks, benefits, and alternatives to generator change that include but are not limited to bleeding and infection. With no sedation or need for lead revision, risk of pneumothorax, pericardial effusion, lead dislodgement, heart attack, stroke, or death are very low.  Pt verbalized understanding and agrees to proceed.   2. HTN Continue current regimen.   3. OSA by history Continue BiPAP.  4. Smoking Encouraged cessation.   Current medicines are reviewed at length with the patient today.   The patient does not have concerns regarding his medicines.  The following changes were made today:  none  Labs/ tests ordered today include:  Orders Placed This Encounter  Procedures  . CBC w/Diff  . CUP PACEART INCLINIC DEVICE CHECK   Disposition:  Usual follow up s/p gen change. CBC today to complete pre procedure labs.   Dustin Flock, PA-C  10/02/2020 3:13 PM  [facility] [address] [place] [address] [phone] (office) [phone] (fax)

## 2020-10-01 NOTE — Telephone Encounter (Signed)
Lat[employer] ICD has reached RR as of 08/12/20. Patient called and made aware he will need to be seen in office to speak with Dr . Graciela Husbands about gen change. Verbalized understanding. Advised I will forward to scheduler and he will set up apt. Agreeable to plan, verbalizes understanding.

## 2020-10-01 NOTE — Progress Notes (Signed)
Cardiology: Dr. Graciela Husbands HF Cardiology: Dr. Shirlee Latch  59 y.o. with history of nonischemic cardiomyopathy with a long history of cardiomyopathy.  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  Echo in 7/18 showed EF 20-25% with diffuse hypokinesis and dilated RV.  He has a strong family history of CHF (father, sister, half-brother).  Sleep study in 12/18 with severe central sleep apnea, now on Bipap.  Echo in 8/19 showed EF 20-25%, moderate MR, mildly decreased RV systolic function.    Echo in 11/20 showed EF 25-30%, mildly decreased RV systolic function.   He returns for HF followup. He is still smoking but trying to cut back.  Eplerenone was decreased to 25 mg daily due to hyperkalemia.  Weight stable.  No lightheadedness, no chest pain, no exertional dyspnea.  His device has been vibrating, nearing ERI.   ECG (personally reviewed): NSR with BiV pacing, paced QTc 509 msec  Labs (8/18): K 4.3, creatinine 0.88 Labs (08/17/2017) K 4.1 Creatinine 1.17, BNP 114 Labs (11/18): K 3.9, creatinine 1.14 Labs (4/19): K 4.5, creatinine 1.06 Labs (6/19): K 4, creatinine 1.09 Labs (9/19): K 3.9, creatinine 1.36 Labs (11/19): K 4.4, creatinine 1.27 Labs (3/20): K 4.5, creatinine 1.31 Labs (8/20): K 4.4, creatinine 1.34 Labs (12/20): K 4.8, creatinine 1.53 Labs (7/21): K 4, creatinine 1.46 Labs (9/21): LDL 56, TGs 337  PMH: 1. Central sleep apnea: Uses Bipap.  2. LBBB: s/p [employer] CRT-D.   3. 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  4. History of atrial tachycardia 5. Active smoker 6. Chronic systolic CHF: nonischemic cardiomyopathy.  Known since prior to 2005.  [employer] CRT-D.  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  - Echo (7/18): EF 20-25%, severe dilated LV, diffuse hypokinesis, severe RV dilation with low normal systolic function, moderate TR, PASP 52 mmHg.  - Gynecomastia with spironolactone.  - Echo (8/19): EF 20-25%, moderate MR, mildly decreased RV systolic function.  - CPX (9/19):  peak VO2 14.4, VE/VCO2 slope 40, RER 1.08.  Moderate functional limitation due to HF and body habitus.  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  - Echo (11/20): EF 25-30%, mildly decreased RV systolic function.   SH: Smokes 4 cigs/day, rare ETOH, occasional marijuana.   Lives with friend in [place].   FH: Father with cardiomyopathy, 1/2 brother died from CHF, sister with CHF.   ROS: All systems reviewed and negative except as per HPI.   Current Outpatient Medications  Medication Sig Dispense Refill   aspirin 81 MG tablet Take 81 mg by mouth daily.       atorvastatin (LIPITOR) 80 MG tablet Take 1 tablet (80 mg total) by mouth daily. 90 tablet 3   carvedilol (COREG) 25 MG tablet TAKE 1 TABLET BY MOUTH TWICE DAILY WITH A MEAL (Patient taking differently: Take 25 mg by mouth 2 (two) times daily with a meal. ) 180 tablet 3   cetirizine (ZYRTEC) 10 MG chewable tablet Chew 10 mg by mouth daily.     dapagliflozin propanediol (FARXIGA) 10 MG TABS tablet Take 1 tablet (10 mg total) by mouth daily before breakfast. 90 tablet 3   eplerenone (INSPRA) 50 MG tablet TAKE 1 TABLET(50 MG) BY MOUTH DAILY 30 tablet 11   furosemide (LASIX) 40 MG tablet TAKE 1 TABLET BY MOUTH EVERY MORNING AND ONE-HALF TABLET EVERY EVENING 90 tablet 3   isosorbide-hydrALAZINE (BIDIL) 20-37.5 MG tablet Take 2 tablets by mouth 3 (three) times daily. 180 tablet 6   mometasone (NASONEX) 50 MCG/ACT  nasal spray 2 sprays each nostril each AM (Patient taking differently: Place 2 sprays into the nose as needed (Allergies). ) 17 g 3   potassium chloride SA (KLOR-CON) 20 MEQ tablet TAKE 1 TABLET(20 MEQ) BY MOUTH DAILY 90 tablet 1   sotalol (BETAPACE) 80 MG tablet Take 1 tablet (80 mg total) by mouth 2 (two) times daily. 180 tablet 3   buPROPion (WELLBUTRIN SR) 150 MG 12 hr tablet Take 1 tablet (150 mg total) by mouth daily for 3 days, THEN 1 tablet (150 mg total) 2 (two) times daily. 60 tablet 3    ENTRESTO 97-103 MG TAKE 1 TABLET BY MOUTH TWICE DAILY 180 tablet 3   No current facility-administered medications for this encounter.   BP 124/82    Pulse 60    Wt 126.2 kg (278 lb 3.2 oz)    SpO2 94%    BMI 38.80 kg/m   General: NAD Neck: Thick. No JVD, no thyromegaly or thyroid nodule.  Lungs: Clear to auscultation bilaterally with normal respiratory effort. CV: Nondisplaced PMI.  Heart regular S1/S2, no S3/S4, no murmur.  No peripheral edema.  No carotid bruit.  Normal pedal pulses.  Abdomen: Soft, nontender, no hepatosplenomegaly, no distention.  Skin: Intact without lesions or rashes.  Neurologic: Alert and oriented x 3.  Psych: Normal affect. Extremities: No clubbing or cyanosis.  HEENT: Normal.   Assessment/Plan: 1. Chronic systolic CHF: Nonischemic cardiomyopathy x years.  [employer] CRT-D device.  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  Echo in 8/19 with EF 20-25%, echo in 11/20 showed EF 25-30%.   He is not volume overloaded on exam.  CPX in 9/19 showed moderate functional limitation from heart failure.  He saw Dr. 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  Today, NYHA class II symptoms.  He is not volume overloaded on exam.  [employer] device is nearing ERI.  - Will need to see Dr. Graciela Husbands re: device nearing ERI.  - Continue Lasix 40 qam/20 qpm.  BMET today.  - Continue dapagliflozin 10 mg daily.   - Continue Entresto 97/103 bid.  - Continue Coreg 25 mg bid.  - Intolerant spiro due to gynecomastia.  Continue eplerenone 25 mg daily (decreased due to hyperkalemia).    - Increase Bidil to 2 tabs tid.   - Taking sotalol b/c of high DFTs.  ECG showed paced QTc 509 msec (keep paced QTc < 550 msec).  - Repeat echo at followup appt.  2. Smoking: Discussed smoking cessation again.   - Will prescribe Wellbutrin to aide smoking cessation.  3. Atrial Tachycardia: He is on sotalol.  4.  Central sleep apnea: Continue Bipap.    Followup with in 4 months with echo.  Will need to see Dr. Gillermina [facility] clinic soon.   Marca Ancona 10/01/2020

## 2020-10-01 NOTE — Telephone Encounter (Signed)
Pt is scheduled 10/02/2020 with Otilio Saber, PA-C.

## 2020-10-01 NOTE — Progress Notes (Signed)
  Electrophysiology Office Note Date: 10/02/2020  ID:  Spencer Robertson, DOB 03/11/1961, MRN [id number]  PCP: Edwards, Michelle P, NP Primary Cardiologist: No primary care provider on file. Electrophysiologist: Steven Klein, MD   CC: Routine ICD follow-up  Spencer Robertson is a 59 y.o. male seen today for Steven Klein, MD for routine electrophysiology followup and having reached RRT with his device. Since last being seen in our clinic the patient reports doing well overall. He reports he has been trying to send his remotes as scheduled, but we have not received them. He is not sure if he has received our letters. he denies chest pain, palpitations, dyspnea, PND, orthopnea, nausea, vomiting, dizziness, syncope, edema, weight gain, or early satiety. He has not had ICD shocks.   Device History: BSCi CRT-D, gen change 08/05/10, originally implanted 2005, Dr. Klein AAD: sotalol added to improve DFT's  Past Medical History:  Diagnosis Date  . CHF (congestive heart failure) (HCC)   . Hyperlipidemia   . Hypertension   . Mitral regurgitation   . Nonischemic cardiomyopathy (HCC)   . Obesity   . Obesity (BMI 30-39.9) 09/01/2015  . OSA (obstructive sleep apnea)    severe  . S/P implantation of automatic cardioverter/defibrillator (AICD)    Guidant Contact H177  . Tobacco abuse    Past Surgical History:  Procedure Laterality Date  . CARDIAC DEFIBRILLATOR PLACEMENT     Guidant Contak H177 device     Current Outpatient Medications  Medication Sig Dispense Refill  . aspirin 81 MG tablet Take 81 mg by mouth daily.      . atorvastatin (LIPITOR) 80 MG tablet Take 1 tablet (80 mg total) by mouth daily. 90 tablet 3  . buPROPion (WELLBUTRIN SR) 150 MG 12 hr tablet Take 1 tablet (150 mg total) by mouth daily for 3 days, THEN 1 tablet (150 mg total) 2 (two) times daily. 60 tablet 3  . carvedilol (COREG) 25 MG tablet TAKE 1 TABLET BY MOUTH TWICE DAILY WITH A MEAL 180 tablet 3  . cetirizine (ZYRTEC) 10  MG chewable tablet Chew 10 mg by mouth daily.    . dapagliflozin propanediol (FARXIGA) 10 MG TABS tablet Take 1 tablet (10 mg total) by mouth daily before breakfast. 90 tablet 3  . ENTRESTO 97-103 MG TAKE 1 TABLET BY MOUTH TWICE DAILY 180 tablet 3  . eplerenone (INSPRA) 50 MG tablet TAKE 1 TABLET(50 MG) BY MOUTH DAILY 30 tablet 11  . furosemide (LASIX) 40 MG tablet TAKE 1 TABLET BY MOUTH EVERY MORNING AND ONE-HALF TABLET EVERY EVENING 90 tablet 3  . isosorbide-hydrALAZINE (BIDIL) 20-37.5 MG tablet Take 2 tablets by mouth 3 (three) times daily. 180 tablet 6  . mometasone (NASONEX) 50 MCG/ACT nasal spray Place 2 sprays into the nose as needed.    . potassium chloride SA (KLOR-CON) 20 MEQ tablet TAKE 1 TABLET(20 MEQ) BY MOUTH DAILY 90 tablet 1  . sotalol (BETAPACE) 80 MG tablet Take 1 tablet (80 mg total) by mouth 2 (two) times daily. 180 tablet 3   No current facility-administered medications for this visit.    Allergies:   Spironolactone   Social History: Social History   Socioeconomic History  . Marital status: Single    Spouse name: Not on file  . Number of children: Not on file  . Years of education: Not on file  . Highest education level: Not on file  Occupational History  . Occupation: disabled  Tobacco Use  . Smoking status: Current Some   Day Smoker    Packs/day: 0.25    Years: 20.00    Pack years: 5.00    Types: Cigarettes  . Smokeless tobacco: Never Used  Vaping Use  . Vaping Use: Never used  Substance and Sexual Activity  . Alcohol use: Yes    Comment: socially  . Drug use: Yes    Types: Marijuana    Comment: occasional  . Sexual activity: Not on file  Other Topics Concern  . Not on file  Social History Narrative  . Not on file   Social Determinants of Health   Financial Resource Strain:   . Difficulty of Paying Living Expenses: Not on file  Food Insecurity:   . Worried About Running Out of Food in the Last Year: Not on file  . Ran Out of Food in the Last  Year: Not on file  Transportation Needs:   . Lack of Transportation (Medical): Not on file  . Lack of Transportation (Non-Medical): Not on file  Physical Activity:   . Days of Exercise per Week: Not on file  . Minutes of Exercise per Session: Not on file  Stress:   . Feeling of Stress : Not on file  Social Connections:   . Frequency of Communication with Friends and Family: Not on file  . Frequency of Social Gatherings with Friends and Family: Not on file  . Attends Religious Services: Not on file  . Active Member of Clubs or Organizations: Not on file  . Attends Club or Organization Meetings: Not on file  . Marital Status: Not on file  Intimate Partner Violence:   . Fear of Current or Ex-Partner: Not on file  . Emotionally Abused: Not on file  . Physically Abused: Not on file  . Sexually Abused: Not on file    Family History: Family History  Problem Relation Age of Onset  . Cardiomyopathy Father   . Hypertension Other   . Heart disease Sister   . Heart disease Brother     Review of Systems: All other systems reviewed and are otherwise negative except as noted above.   Physical Exam: Vitals:   10/02/20 1150  BP: 128/68  Pulse: 72  SpO2: 95%  Weight: 277 lb (125.6 kg)  Height: 5' 11" (1.803 m)     GEN- The patient is well appearing, alert and oriented x 3 today.   HEENT: normocephalic, atraumatic; sclera clear, conjunctiva pink; hearing intact; oropharynx clear; neck supple, no JVP Lymph- no cervical lymphadenopathy Lungs- Clear to ausculation bilaterally, normal work of breathing.  No wheezes, rales, rhonchi Heart- Regular rate and rhythm, no murmurs, rubs or gallops, PMI not laterally displaced GI- soft, non-tender, non-distended, bowel sounds present, no hepatosplenomegaly Extremities- no clubbing or cyanosis. No edema; DP/PT/radial pulses 2+ bilaterally MS- no significant deformity or atrophy Skin- warm and dry, no rash or lesion; ICD pocket well  healed Psych- euthymic mood, full affect Neuro- strength and sensation are intact  ICD interrogation- reviewed in detail today,  See PACEART report  EKG:  EKG is not ordered today.  Recent Labs: 10/29/2019: Magnesium 1.9 06/23/2020: ALT 13; Hemoglobin 15.0; Platelets 143 09/30/2020: BUN 18; Creatinine, Ser 1.35; Potassium 3.9; Sodium 134   Wt Readings from Last 3 Encounters:  10/02/20 277 lb (125.6 kg)  09/30/20 278 lb 3.2 oz (126.2 kg)  08/25/20 279 lb 3.2 oz (126.6 kg)     Other studies Reviewed: Additional studies/ records that were reviewed today include: Previous EP office notes, CHF notes.     Assessment and Plan:  1.  Chronic systolic dysfunction s/p [employer] CRT-D  euvolemic today Stable on an appropriate medical regimen. Follows with Dr. McLean Normal ICD function at ERI. Will schedule for gen change with Dr. Klein. See Pace Art report No changes today Explained risks, benefits, and alternatives to generator change that include but are not limited to bleeding and infection. With no sedation or need for lead revision, risk of pneumothorax, pericardial effusion, lead dislodgement, heart attack, stroke, or death are very low.  Pt verbalized understanding and agrees to proceed.   2. HTN Continue current regimen.   3. OSA by history Continue BiPAP.  4. Smoking Encouraged cessation.   Current medicines are reviewed at length with the patient today.   The patient does not have concerns regarding his medicines.  The following changes were made today:  none  Labs/ tests ordered today include:  Orders Placed This Encounter  Procedures  . CBC w/Diff  . CUP PACEART INCLINIC DEVICE CHECK   Disposition:  Usual follow up s/p gen change. CBC today to complete pre procedure labs.   Signed, Sherlie Boyum Andrew [place] Oborn, PA-C  10/02/2020 3:13 PM  [facility] [address] [place] [address] [phone] (office) [phone] (fax)  

## 2020-10-02 ENCOUNTER — Telehealth: Payer: Self-pay | Admitting: Cardiology

## 2020-10-02 ENCOUNTER — Other Ambulatory Visit: Payer: Self-pay

## 2020-10-02 ENCOUNTER — Ambulatory Visit (INDEPENDENT_AMBULATORY_CARE_PROVIDER_SITE_OTHER): Payer: Medicaid Other | Admitting: Student

## 2020-10-02 ENCOUNTER — Encounter: Payer: Self-pay | Admitting: Student

## 2020-10-02 VITALS — BP 128/68 | HR 72 | Ht 71.0 in | Wt 277.0 lb

## 2020-10-02 DIAGNOSIS — G4733 Obstructive sleep apnea (adult) (pediatric): Secondary | ICD-10-CM | POA: Diagnosis not present

## 2020-10-02 DIAGNOSIS — Z95810 Presence of automatic (implantable) cardiac defibrillator: Secondary | ICD-10-CM

## 2020-10-02 DIAGNOSIS — I10 Essential (primary) hypertension: Secondary | ICD-10-CM

## 2020-10-02 DIAGNOSIS — I420 Dilated cardiomyopathy: Secondary | ICD-10-CM | POA: Diagnosis not present

## 2020-10-02 LAB — CUP PACEART INCLINIC DEVICE CHECK
Date Time Interrogation Session: 20211028150400
HighPow Impedance: 44 Ohm
HighPow Impedance: 68 Ohm
Implantable Lead Implant Date: 20051122
Implantable Lead Implant Date: 20051122
Implantable Lead Implant Date: 20051122
Implantable Lead Location: 753858
Implantable Lead Location: 753859
Implantable Lead Location: 753860
Implantable Lead Model: 158
Implantable Lead Model: 4194
Implantable Lead Model: 5076
Implantable Lead Serial Number: 159458
Implantable Pulse Generator Implant Date: 20110831
Lead Channel Impedance Value: 504 Ohm
Lead Channel Impedance Value: 550 Ohm
Lead Channel Impedance Value: 760 Ohm
Lead Channel Pacing Threshold Amplitude: 0.8 V
Lead Channel Pacing Threshold Amplitude: 1 V
Lead Channel Pacing Threshold Amplitude: 1 V
Lead Channel Pacing Threshold Pulse Width: 0.4 ms
Lead Channel Pacing Threshold Pulse Width: 0.4 ms
Lead Channel Pacing Threshold Pulse Width: 0.4 ms
Lead Channel Sensing Intrinsic Amplitude: 10.5 mV
Lead Channel Sensing Intrinsic Amplitude: 24.1 mV
Lead Channel Sensing Intrinsic Amplitude: 4.9 mV
Lead Channel Setting Pacing Amplitude: 2 V
Lead Channel Setting Pacing Amplitude: 2 V
Lead Channel Setting Pacing Amplitude: 2.4 V
Lead Channel Setting Pacing Pulse Width: 0.4 ms
Lead Channel Setting Pacing Pulse Width: 0.4 ms
Lead Channel Setting Sensing Sensitivity: 0.5 mV
Lead Channel Setting Sensing Sensitivity: 1 mV
Pulse Gen Serial Number: 480425

## 2020-10-02 LAB — CBC WITH DIFFERENTIAL/PLATELET
Basophils Absolute: 0.1 10*3/uL (ref 0.0–0.2)
Basos: 1 %
EOS (ABSOLUTE): 0.2 10*3/uL (ref 0.0–0.4)
Eos: 5 %
Hematocrit: 50.8 % (ref 37.5–51.0)
Hemoglobin: 16.8 g/dL (ref 13.0–17.7)
Immature Grans (Abs): 0 10*3/uL (ref 0.0–0.1)
Immature Granulocytes: 0 %
Lymphocytes Absolute: 1.8 10*3/uL (ref 0.7–3.1)
Lymphs: 37 %
MCH: 30.4 pg (ref 26.6–33.0)
MCHC: 33.1 g/dL (ref 31.5–35.7)
MCV: 92 fL (ref 79–97)
Monocytes Absolute: 0.6 10*3/uL (ref 0.1–0.9)
Monocytes: 13 %
Neutrophils Absolute: 2.1 10*3/uL (ref 1.4–7.0)
Neutrophils: 44 %
Platelets: 118 10*3/uL — ABNORMAL LOW (ref 150–450)
RBC: 5.53 x10E6/uL (ref 4.14–5.80)
RDW: 14.7 % (ref 11.6–15.4)
WBC: 4.8 10*3/uL (ref 3.4–10.8)

## 2020-10-02 NOTE — Patient Instructions (Signed)
Medication Instructions: Your physician recommends that you continue on your current medications as directed. Please refer to the Current Medication list given to you today.  Labwork: Your physician has recommended that you have lab work today: CBC  Procedures/Testing: Your physician has recommended that you have a Generator Change of your device. This is a procedure that replaces a Pacemaker ICD generator that is at the end of its service life. The remaining lifespan of a pacemaker is determined during visits to the Device Clinic. The battery in a pacemaker does not stop suddenly but rather loses its charge slowly, which lets the cardiologist plan the replacement date.  Follow-Up: Your physician recommends that you schedule a follow-up appointment in 10 - 14 days from 10/06/20 with the Device clinic for a wound check  Your physician recommends that you schedule a follow-up appointment in 3 months from 10/06/20 with Dr. Graciela Husbands.  If you need a refill on your cardiac medications before your next appointment, please call your pharmacy.   -------------------------------------------------------------------------------------------------------------  Please wash with the CHG Soap the night before and morning of procedure (follow instruction page "Preparing For Surgery").   Please report to the Main Entrance [employer] Main Entrance of [facility] at 7:30 AM on Monday 10/06/20 [facility], [place] [address])  DO NOT eat or drink anything after midnight the night before procedure  You may take all of your morning medications the day of your procedure EXCEPT Aspirin with enough water to get them down safely.  You will need someone to drive you home after the procedure

## 2020-10-06 ENCOUNTER — Other Ambulatory Visit: Payer: Self-pay

## 2020-10-06 ENCOUNTER — Ambulatory Visit (HOSPITAL_COMMUNITY)
Admission: RE | Admit: 2020-10-06 | Discharge: 2020-10-06 | Disposition: A | Discharge status: Home / Self Care | Payer: Medicaid Other | Attending: Internal Medicine | Admitting: Internal Medicine

## 2020-10-06 ENCOUNTER — Ambulatory Visit (HOSPITAL_COMMUNITY)
Admission: RE | Disposition: A | Discharge status: Home / Self Care | Payer: Self-pay | Source: Home / Self Care | Attending: Internal Medicine

## 2020-10-06 DIAGNOSIS — Z4502 Encounter for adjustment and management of automatic implantable cardiac defibrillator: Secondary | ICD-10-CM | POA: Insufficient documentation

## 2020-10-06 DIAGNOSIS — I110 Hypertensive heart disease with heart failure: Secondary | ICD-10-CM | POA: Diagnosis not present

## 2020-10-06 DIAGNOSIS — Z8249 Family history of ischemic heart disease and other diseases of the circulatory system: Secondary | ICD-10-CM | POA: Diagnosis not present

## 2020-10-06 DIAGNOSIS — Z006 Encounter for examination for normal comparison and control in clinical research program: Secondary | ICD-10-CM | POA: Diagnosis not present

## 2020-10-06 DIAGNOSIS — Z888 Allergy status to other drugs, medicaments and biological substances status: Secondary | ICD-10-CM | POA: Diagnosis not present

## 2020-10-06 DIAGNOSIS — Z20822 Contact with and (suspected) exposure to covid-19: Secondary | ICD-10-CM | POA: Insufficient documentation

## 2020-10-06 DIAGNOSIS — I5022 Chronic systolic (congestive) heart failure: Secondary | ICD-10-CM | POA: Insufficient documentation

## 2020-10-06 DIAGNOSIS — F17210 Nicotine dependence, cigarettes, uncomplicated: Secondary | ICD-10-CM | POA: Insufficient documentation

## 2020-10-06 DIAGNOSIS — G4733 Obstructive sleep apnea (adult) (pediatric): Secondary | ICD-10-CM | POA: Insufficient documentation

## 2020-10-06 HISTORY — PX: BIV ICD GENERATOR CHANGEOUT: EP1194

## 2020-10-06 LAB — SARS CORONAVIRUS 2 BY RT PCR (HOSPITAL ORDER, PERFORMED IN ~~LOC~~ HOSPITAL LAB): SARS Coronavirus 2: NEGATIVE

## 2020-10-06 SURGERY — BIV ICD GENERATOR CHANGEOUT

## 2020-10-06 MED ORDER — SODIUM CHLORIDE 0.9 % IV SOLN
80.0000 mg | INTRAVENOUS | Status: AC
  Administered 2020-10-06: 80 mg

## 2020-10-06 MED ORDER — FENTANYL CITRATE (PF) 100 MCG/2ML IJ SOLN
INTRAMUSCULAR | Status: DC | PRN
  Administered 2020-10-06: 50 ug via INTRAVENOUS

## 2020-10-06 MED ORDER — MIDAZOLAM HCL 5 MG/5ML IJ SOLN
INTRAMUSCULAR | Status: DC | PRN
  Administered 2020-10-06: 2 mg via INTRAVENOUS

## 2020-10-06 MED ORDER — SODIUM CHLORIDE 0.9 % IV SOLN: INTRAVENOUS | Status: DC

## 2020-10-06 MED ORDER — LIDOCAINE HCL 1 % IJ SOLN
INTRAMUSCULAR | Status: AC
  Filled 2020-10-06: qty 60

## 2020-10-06 MED ORDER — CHLORHEXIDINE GLUCONATE 4 % EX LIQD: 4.0000 "application " | Freq: Once | CUTANEOUS | Status: DC

## 2020-10-06 MED ORDER — LIDOCAINE HCL (PF) 1 % IJ SOLN
INTRAMUSCULAR | Status: DC | PRN
  Administered 2020-10-06: 60 mL

## 2020-10-06 MED ORDER — SODIUM CHLORIDE 0.9 % IV SOLN: INTRAVENOUS | Status: AC

## 2020-10-06 MED ORDER — SODIUM CHLORIDE 0.9 % IV SOLN
INTRAVENOUS | Status: AC
  Filled 2020-10-06: qty 2

## 2020-10-06 MED ORDER — DEXTROSE 5 % IV SOLN
3.0000 g | Freq: Once | INTRAVENOUS | Status: DC
  Filled 2020-10-06: qty 3000

## 2020-10-06 MED ORDER — ACETAMINOPHEN 325 MG PO TABS: 325.0000 mg | ORAL_TABLET | ORAL | Status: DC | PRN

## 2020-10-06 MED ORDER — FENTANYL CITRATE (PF) 100 MCG/2ML IJ SOLN
INTRAMUSCULAR | Status: AC
  Filled 2020-10-06: qty 2

## 2020-10-06 MED ORDER — DEXTROSE 5 % IV SOLN
3.0000 g | INTRAVENOUS | Status: AC
  Administered 2020-10-06: 3 g via INTRAVENOUS
  Filled 2020-10-06: qty 3000

## 2020-10-06 MED ORDER — MIDAZOLAM HCL 5 MG/5ML IJ SOLN
INTRAMUSCULAR | Status: AC
  Filled 2020-10-06: qty 5

## 2020-10-06 SURGICAL SUPPLY — 7 items
CABLE SURGICAL S-101-97-12 (CABLE) ×2 IMPLANT
HEMOSTAT SURGICEL 2X4 FIBR (HEMOSTASIS) ×2 IMPLANT
ICD MOMENTUM G125 (ICD Generator) ×2 IMPLANT
PAD PRO RADIOLUCENT 2001M-C (PAD) ×2 IMPLANT
POUCH AIGIS-R ANTIBACT ICD (Mesh General) ×3 IMPLANT
POUCH AIGIS-R ANTIBACT ICD LRG (Mesh General) IMPLANT
TRAY PACEMAKER INSERTION (PACKS) ×2 IMPLANT

## 2020-10-06 NOTE — Interval H&P Note (Signed)
History and Physical Interval Note:  10/06/2020 8:11 AM  Spencer Robertson  has presented today for surgery, with the diagnosis of ERI.  The various methods of treatment have been discussed with the patient and family. After consideration of risks, benefits and other options for treatment, the patient has consented to  Procedure(s): BIV ICD GENERATOR CHANGEOUT (N/A) as a surgical intervention.  The patient's history has been reviewed, patient examined, no change in status, stable for surgery.  I have reviewed the patient's chart and labs.  Questions were answered to the patient's satisfaction.     Sherryl Manges  For generator change    Will use Aegis pouch

## 2020-10-06 NOTE — Discharge Instructions (Signed)
Implantable Cardiac Device Battery Change, Care After  This sheet gives you information about how to care for yourself after your procedure. Your health care provider may also give you more specific instructions. If you have problems or questions, contact your health care provider. What can I expect after the procedure? After your procedure, it is common to have:  Pain or soreness at the site where the cardiac device was inserted.  Swelling at the site where the cardiac device was inserted.  You should received an information card for your new device in 4-8 weeks. Follow these instructions at home: Incision care   Keep the incision clean and dry. ? Do not take baths, swim, or use a hot tub until after your wound check.  ? Do not shower until tomorrow, or as directed by your health care provider. ? Pat the area dry with a clean towel. Do not rub the area. This may cause bleeding.  Follow instructions from your health care provider about how to take care of your incision.  Check your incision area every day for signs of infection. Check for: ? More redness, swelling, or pain. ? More fluid or blood. ? Warmth. ? Pus or a bad smell. Activity  Do not lift anything that is heavier than 10 lb (4.5 kg) until your health care provider says it is okay to do so.  For the first week, or as long as told by your health care provider: ? Avoid lifting your affected arm higher than your shoulder. ? After 1 week, Be gentle when you move your arms over your head. It is okay to raise your arm to comb your hair. ? Avoid strenuous exercise.  Ask your health care provider when it is okay to: ? Resume your normal activities. ? Return to work or school. ? Resume sexual activity. Eating and drinking  Eat a heart-healthy diet. This should include plenty of fresh fruits and vegetables, whole grains, low-fat dairy products, and lean protein like chicken and fish.  Limit alcohol intake to no more than 1  drink a day for non-pregnant women and 2 drinks a day for men. One drink equals 12 oz of beer, 5 oz of wine, or 1 oz of hard liquor.  Check ingredients and nutrition facts on packaged foods and beverages. Avoid the following types of food: ? Food that is high in salt (sodium). ? Food that is high in saturated fat, like full-fat dairy or red meat. ? Food that is high in trans fat, like fried food. ? Food and drinks that are high in sugar. Lifestyle  Do not use any products that contain nicotine or tobacco, such as cigarettes and e-cigarettes. If you need help quitting, ask your health care provider.  Take steps to manage and control your weight.  Once cleared, get regular exercise. Aim for 150 minutes of moderate-intensity exercise (such as walking or yoga) or 75 minutes of vigorous exercise (such as running or swimming) each week.  Manage other health problems, such as diabetes or high blood pressure. Ask your health care provider how you can manage these conditions. General instructions  Do not drive for 24 hours after your procedure if you were given a medicine to help you relax (sedative).  Take over-the-counter and prescription medicines only as told by your health care provider.  Avoid putting pressure on the area where the cardiac device was placed.  If you need an MRI after your cardiac device has been placed, be sure to tell  the health care provider who orders the MRI that you have a cardiac device.  Avoid close and prolonged exposure to electrical devices that have strong magnetic fields. These include: ? Cell phones. Avoid keeping them in a pocket near the cardiac device, and try using the ear opposite the cardiac device. ? MP3 players. ? Household appliances, like microwaves. ? Metal detectors. ? Electric generators. ? High-tension wires.  Keep all follow-up visits as directed by your health care provider. This is important. Contact a health care provider if:  You  have pain at the incision site that is not relieved by over-the-counter or prescription medicines.  You have any of these around your incision site or coming from it: ? More redness, swelling, or pain. ? Fluid or blood. ? Warmth to the touch. ? Pus or a bad smell.  You have a fever.  You feel brief, occasional palpitations, light-headedness, or any symptoms that you think might be related to your heart. Get help right away if:  You experience chest pain that is different from the pain at the cardiac device site.  You develop a red streak that extends above or below the incision site.  You experience shortness of breath.  You have palpitations or an irregular heartbeat.  You have light-headedness that does not go away quickly.  You faint or have dizzy spells.  Your pulse suddenly drops or increases rapidly and does not return to normal.  You begin to gain weight and your legs and ankles swell. Summary          No driving for 4 days   After your procedure, it is common to have pain, soreness, and some swelling where the cardiac device was inserted.  Make sure to keep your incision clean and dry. Follow instructions from your health care provider about how to take care of your incision.  Check your incision every day for signs of infection, such as more pain or swelling, pus or a bad smell, warmth, or leaking fluid and blood.  Avoid strenuous exercise and lifting your left arm higher than your shoulder for 2 weeks, or as long as told by your health care provider. This information is not intended to replace advice given to you by your health care provider. Make sure you discuss any questions you have with your health care provider.

## 2020-10-06 NOTE — Interval H&P Note (Signed)
ICD Criteria  Current LVEF:25%. Within 12 months prior to implant: Yes   Heart failure history: Yes, Class III  Cardiomyopathy history: Yes, Non-Ischemic Cardiomyopathy.  Atrial Fibrillation/Atrial Flutter: No.  Ventricular tachycardia history: No.  Cardiac arrest history: No.  History of syndromes with risk of sudden death: No.  Previous ICD: Yes, Reason for ICD:  Primary prevention.  Current ICD indication: Primary  PPM indication: No.  Class I or II Bradycardia indication present: Yes  Beta Blocker therapy for 3 or more months: Yes, prescribed.   Ace Inhibitor/ARB therapy for 3 or more months: Yes, prescribed.    I have seen Spencer Robertson is a 59 y.o. malepre-procedural and has been referred by *DM for consideration of ICD implant for primaryprevention of sudden death.  The patient's chart has been reviewed and they meet criteria for ICD implant.  I have had a thorough discussion with the patient reviewing options.  The patient and their family (if available) have had opportunities to ask questions and have them answered. The patient and I have decided together through the [facility] Heart Care Share Decision Support Tool to reimplant ICD at this time.  Risks, benefits, alternatives to ICD implantation were discussed in detail with the patient today. The patient  understands that the risks include but are not limited to bleeding, infection, pneumothorax, perforation, tamponade, vascular damage, renal failure, MI, stroke, death, inappropriate shocks, and lead dislodgement and   wishes to proceed.  History and Physical Interval Note:  10/06/2020 1:46 PM  Spencer Robertson  has presented today for surgery, with the diagnosis of ERI.  The various methods of treatment have been discussed with the patient and family. After consideration of risks, benefits and other options for treatment, the patient has consented to  Procedure(s): BIV ICD GENERATOR CHANGEOUT (N/A) as a surgical intervention.   The patient's history has been reviewed, patient examined, no change in status, stable for surgery.  I have reviewed the patient's chart and labs.  Questions were answered to the patient's satisfaction.     Sherryl Manges

## 2020-10-07 ENCOUNTER — Encounter (HOSPITAL_COMMUNITY): Payer: Self-pay | Admitting: Internal Medicine

## 2020-10-07 ENCOUNTER — Telehealth: Payer: Self-pay

## 2020-10-07 MED FILL — Lidocaine HCl Local Inj 1%: INTRAMUSCULAR | Qty: 60 | Status: AC

## 2020-10-07 NOTE — Telephone Encounter (Signed)
The pt wanted to know if he can take tylenol. I asked the nurse Amy, rn and she states it is okay for the patient to tylenol.

## 2020-10-16 ENCOUNTER — Other Ambulatory Visit: Payer: Self-pay

## 2020-10-16 ENCOUNTER — Ambulatory Visit (INDEPENDENT_AMBULATORY_CARE_PROVIDER_SITE_OTHER): Payer: Medicaid Other | Admitting: Emergency Medicine

## 2020-10-16 DIAGNOSIS — I5022 Chronic systolic (congestive) heart failure: Secondary | ICD-10-CM

## 2020-10-16 DIAGNOSIS — Z95810 Presence of automatic (implantable) cardiac defibrillator: Secondary | ICD-10-CM

## 2020-10-16 LAB — CUP PACEART INCLINIC DEVICE CHECK
Date Time Interrogation Session: 20211111103234
HighPow Impedance: 48 Ohm
Implantable Lead Implant Date: 20051122
Implantable Lead Implant Date: 20051122
Implantable Lead Implant Date: 20051122
Implantable Lead Location: 753858
Implantable Lead Location: 753859
Implantable Lead Location: 753860
Implantable Lead Model: 158
Implantable Lead Model: 4194
Implantable Lead Model: 5076
Implantable Lead Serial Number: 159458
Implantable Pulse Generator Implant Date: 20211101
Lead Channel Impedance Value: 473 Ohm
Lead Channel Impedance Value: 555 Ohm
Lead Channel Impedance Value: 744 Ohm
Lead Channel Pacing Threshold Amplitude: 0.8 V
Lead Channel Pacing Threshold Amplitude: 1 V
Lead Channel Pacing Threshold Amplitude: 1.3 V
Lead Channel Pacing Threshold Pulse Width: 0.4 ms
Lead Channel Pacing Threshold Pulse Width: 0.4 ms
Lead Channel Pacing Threshold Pulse Width: 0.4 ms
Lead Channel Sensing Intrinsic Amplitude: 23.9 mV
Lead Channel Sensing Intrinsic Amplitude: 5 mV
Pulse Gen Serial Number: 147594

## 2020-10-16 NOTE — Progress Notes (Signed)
Wound check appointment. Dermabond removed. Wound without redness or edema. Incision edges approximated, wound well healed. DEvice interrogated, report unavailable due to system error in saving.  Normal device function. Thresholds, sensing, and impedances consistent with implant measurements. Patient is BiV pacing 99% of the time.  Device outputs  programmed at appropriate safety margin for chronic leads. Histogram distribution appropriate for patient and level of activity. No mode switches or ventricular arrhythmias noted. Patient educated about wound care, arm mobility, lifting restrictions, shock plan. ROV with Dr. Graciela Husbands 02/03/21.

## 2020-11-03 ENCOUNTER — Telehealth: Payer: Self-pay

## 2020-11-03 NOTE — Telephone Encounter (Signed)
The pt states he is having a difficult time getting his prescription filled at the pharmacy. He would like for the nurse to give him a call back at [phone].

## 2020-11-04 ENCOUNTER — Telehealth (HOSPITAL_COMMUNITY): Payer: Self-pay | Admitting: Pharmacy Technician

## 2020-11-04 NOTE — Telephone Encounter (Signed)
Patient Advocate Encounter   Received notification from [facility]) that prior authorization for Sherryll Burger is required.   PA submitted on CoverMyMeds Key  [id number] Status is pending  Received notification from [facility]) that prior authorization for Marcelline Deist is required.   PA submitted on CoverMyMeds Key  [id number] Status is pending   Will continue to follow.

## 2020-11-04 NOTE — Telephone Encounter (Signed)
Attempted phone call to pt.  Left voicemail message to contact RN at [phone]. 

## 2020-11-04 NOTE — Telephone Encounter (Signed)
Advanced Heart Failure Patient Advocate Encounter  Prior Authorization for Sherryll Burger has been approved.    PA# [id number] Effective dates: 10/21/20 through 11/04/21  Patients co-pay is $0  Prior Authorization for Marcelline Deist has been approved.    PA# [id number] Effective dates: 11/04/20 through further notice.  Patients co-pay is $0  Spoke with patient. He should be able to pick up the medication from the pharmacy without problems now.  Archer Asa, CPhT

## 2020-11-16 ENCOUNTER — Other Ambulatory Visit (HOSPITAL_COMMUNITY): Payer: Self-pay | Admitting: Cardiology

## 2020-11-19 NOTE — Telephone Encounter (Signed)
Spoke with pt who states he had problems getting his Furosemide refilled but was able to get medication.  Pt states he has no further needs or questions at this time.  Pt reminded of his 02/03/2021 appointment with Dr Graciela Husbands.  Pt verbalizes understanding and agrees with current plan.

## 2020-11-24 ENCOUNTER — Other Ambulatory Visit (HOSPITAL_COMMUNITY): Payer: Self-pay | Admitting: Cardiology

## 2020-11-24 ENCOUNTER — Encounter: Payer: Medicaid Other | Admitting: Gastroenterology

## 2020-12-12 ENCOUNTER — Ambulatory Visit: Payer: Medicaid Other | Admitting: Gastroenterology

## 2020-12-29 ENCOUNTER — Telehealth: Payer: Self-pay | Admitting: Physician Assistant

## 2020-12-29 NOTE — Telephone Encounter (Signed)
Called the patient today regarding device alert for elevated Heart logic score. device measurements are ok, no arrhythmias He feels well, denies any unusual symptoms of any kind.  Denies SOB, DOE, no bloating or swelling.  May have indulged in more then usual sodium of late. He reports sleeping well with no nocturnal symptoms He has appts next wee 02/03/21 for echo opt then Dr. Shirlee Latch and after that Dr. Graciela Husbands. Given no symptoms, no medication recs were given though did ask him to reduce/minimize his sodium intake and if he develops any symptoms, SOB to reach out to Dr. Shirlee Latch 's office. He was given date/times of his appointments.  Spencer Dowse, PA-C

## 2021-01-06 ENCOUNTER — Ambulatory Visit (INDEPENDENT_AMBULATORY_CARE_PROVIDER_SITE_OTHER): Payer: Medicaid Other

## 2021-01-06 DIAGNOSIS — I420 Dilated cardiomyopathy: Secondary | ICD-10-CM | POA: Diagnosis not present

## 2021-01-06 LAB — CUP PACEART REMOTE DEVICE CHECK
Battery Remaining Longevity: 132 mo
Battery Remaining Percentage: 100 %
Brady Statistic RA Percent Paced: 33 %
Brady Statistic RV Percent Paced: 99 %
Date Time Interrogation Session: 20220201050200
HighPow Impedance: 53 Ohm
Implantable Lead Implant Date: 20051122
Implantable Lead Implant Date: 20051122
Implantable Lead Implant Date: 20051122
Implantable Lead Location: 753858
Implantable Lead Location: 753859
Implantable Lead Location: 753860
Implantable Lead Model: 158
Implantable Lead Model: 4194
Implantable Lead Model: 5076
Implantable Lead Serial Number: 159458
Implantable Pulse Generator Implant Date: 20211101
Lead Channel Impedance Value: 473 Ohm
Lead Channel Impedance Value: 590 Ohm
Lead Channel Impedance Value: 719 Ohm
Lead Channel Setting Pacing Amplitude: 2 V
Lead Channel Setting Pacing Amplitude: 2 V
Lead Channel Setting Pacing Amplitude: 2.4 V
Lead Channel Setting Pacing Pulse Width: 0.4 ms
Lead Channel Setting Pacing Pulse Width: 0.4 ms
Lead Channel Setting Sensing Sensitivity: 0.5 mV
Lead Channel Setting Sensing Sensitivity: 1 mV
Pulse Gen Serial Number: 147594

## 2021-01-15 NOTE — Progress Notes (Signed)
Remote ICD transmission.   

## 2021-01-21 ENCOUNTER — Other Ambulatory Visit: Payer: Self-pay | Admitting: Cardiology

## 2021-02-03 ENCOUNTER — Ambulatory Visit (HOSPITAL_COMMUNITY)
Admission: RE | Admit: 2021-02-03 | Discharge: 2021-02-03 | Disposition: A | Discharge status: Home / Self Care | Payer: Medicaid Other | Source: Ambulatory Visit | Attending: Cardiology | Admitting: Cardiology

## 2021-02-03 ENCOUNTER — Other Ambulatory Visit: Payer: Self-pay

## 2021-02-03 ENCOUNTER — Ambulatory Visit (HOSPITAL_BASED_OUTPATIENT_CLINIC_OR_DEPARTMENT_OTHER)
Admission: RE | Admit: 2021-02-03 | Discharge: 2021-02-03 | Disposition: A | Discharge status: Home / Self Care | Payer: Medicaid Other | Source: Ambulatory Visit | Attending: Cardiology | Admitting: Cardiology

## 2021-02-03 ENCOUNTER — Encounter: Payer: Medicaid Other | Admitting: Internal Medicine

## 2021-02-03 ENCOUNTER — Encounter (HOSPITAL_COMMUNITY): Payer: Self-pay | Admitting: Cardiology

## 2021-02-03 VITALS — BP 100/60 | HR 64 | Wt 277.0 lb

## 2021-02-03 DIAGNOSIS — I5022 Chronic systolic (congestive) heart failure: Secondary | ICD-10-CM | POA: Diagnosis present

## 2021-02-03 DIAGNOSIS — Z7982 Long term (current) use of aspirin: Secondary | ICD-10-CM | POA: Diagnosis not present

## 2021-02-03 DIAGNOSIS — Z95810 Presence of automatic (implantable) cardiac defibrillator: Secondary | ICD-10-CM | POA: Diagnosis not present

## 2021-02-03 DIAGNOSIS — Z8249 Family history of ischemic heart disease and other diseases of the circulatory system: Secondary | ICD-10-CM | POA: Diagnosis not present

## 2021-02-03 DIAGNOSIS — Z79899 Other long term (current) drug therapy: Secondary | ICD-10-CM | POA: Diagnosis not present

## 2021-02-03 DIAGNOSIS — I428 Other cardiomyopathies: Secondary | ICD-10-CM | POA: Diagnosis not present

## 2021-02-03 DIAGNOSIS — G4731 Primary central sleep apnea: Secondary | ICD-10-CM | POA: Insufficient documentation

## 2021-02-03 DIAGNOSIS — F17210 Nicotine dependence, cigarettes, uncomplicated: Secondary | ICD-10-CM | POA: Insufficient documentation

## 2021-02-03 DIAGNOSIS — I447 Left bundle-branch block, unspecified: Secondary | ICD-10-CM | POA: Diagnosis not present

## 2021-02-03 DIAGNOSIS — I471 Supraventricular tachycardia: Secondary | ICD-10-CM | POA: Insufficient documentation

## 2021-02-03 DIAGNOSIS — I420 Dilated cardiomyopathy: Secondary | ICD-10-CM

## 2021-02-03 LAB — BASIC METABOLIC PANEL
Anion gap: 8 (ref 5–15)
BUN: 26 mg/dL — ABNORMAL HIGH (ref 6–20)
CO2: 27 mmol/L (ref 22–32)
Calcium: 9.4 mg/dL (ref 8.9–10.3)
Chloride: 105 mmol/L (ref 98–111)
Creatinine, Ser: 1.41 mg/dL — ABNORMAL HIGH (ref 0.61–1.24)
GFR, Estimated: 58 mL/min — ABNORMAL LOW (ref >60)
Glucose, Bld: 88 mg/dL (ref 70–99)
Potassium: 4.4 mmol/L (ref 3.5–5.1)
Sodium: 140 mmol/L (ref 135–145)

## 2021-02-03 LAB — ECHOCARDIOGRAM COMPLETE
Area-P 1/2: 4.15 cm2
S' Lateral: 5.2 cm

## 2021-02-03 NOTE — Progress Notes (Signed)
Cardiology: Dr. Graciela Husbands HF Cardiology: Dr. Shirlee Latch  60 y.o. with history of nonischemic cardiomyopathy with a long history of cardiomyopathy.  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  Echo in 7/18 showed EF 20-25% with diffuse hypokinesis and dilated RV.  He has a strong family history of CHF (father, sister, half-brother).  Sleep study in 12/18 with severe central sleep apnea, now on Bipap.  Echo in 8/19 showed EF 20-25%, moderate MR, mildly decreased RV systolic function.    Echo in 11/20 showed EF 25-30%, mildly decreased RV systolic function.  Echo was done today, EF 25-30%, diffuse hypokinesis, mildly decreased RV systolic function.   He returns for HF followup. He is still smoking but trying to cut back.  He is taking all his meds.  Weight is down 1 lb.  Using CPAP. No significant exertional dyspnea or chest pain. No real limitations.   ECG (personally reviewed): NSR with BiV pacing, paced QTc 511 msec  Labs (8/18): K 4.3, creatinine 0.88 Labs (08/17/2017) K 4.1 Creatinine 1.17, BNP 114 Labs (11/18): K 3.9, creatinine 1.14 Labs (4/19): K 4.5, creatinine 1.06 Labs (6/19): K 4, creatinine 1.09 Labs (9/19): K 3.9, creatinine 1.36 Labs (11/19): K 4.4, creatinine 1.27 Labs (3/20): K 4.5, creatinine 1.31 Labs (8/20): K 4.4, creatinine 1.34 Labs (12/20): K 4.8, creatinine 1.53 Labs (7/21): K 4, creatinine 1.46 Labs (9/21): LDL 56, TGs 337 Labs (10/21): K 3.9, creatinine 1.35  PMH: 1. Central sleep apnea: Uses Bipap.  2. LBBB: s/p [employer] CRT-D.   3. 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  4. History of atrial tachycardia 5. Active smoker 6. Chronic systolic CHF: nonischemic cardiomyopathy.  Known since prior to 2005.  [employer] CRT-D.  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  - Echo (7/18): EF 20-25%, severe dilated LV, diffuse hypokinesis, severe RV dilation with low normal systolic function, moderate TR, PASP 52 mmHg.  - Gynecomastia with spironolactone.  - Echo (8/19):  EF 20-25%, moderate MR, mildly decreased RV systolic function.  - CPX (9/19): peak VO2 14.4, VE/VCO2 slope 40, RER 1.08.  Moderate functional limitation due to HF and body habitus.  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  - Echo (11/20): EF 25-30%, mildly decreased RV systolic function.  - Echo (3/22): EF 25-30%, diffuse hypokinesis, mildly decreased RV systolic function.   SH: Smokes 4 cigs/day, rare ETOH, occasional marijuana.   Lives with friend in [place].   FH: Father with cardiomyopathy, 1/2 brother died from CHF, sister with CHF.   ROS: All systems reviewed and negative except as per HPI.   Current Outpatient Medications  Medication Sig Dispense Refill  . aspirin 81 MG tablet Take 81 mg by mouth daily.    Marland Kitchen atorvastatin (LIPITOR) 80 MG tablet Take 1 tablet (80 mg total) by mouth daily. 90 tablet 3  . buPROPion (WELLBUTRIN SR) 150 MG 12 hr tablet Take 1 tablet (150 mg total) by mouth daily for 3 days, THEN 1 tablet (150 mg total) 2 (two) times daily. 60 tablet 3  . carvedilol (COREG) 25 MG tablet TAKE 1 TABLET BY MOUTH TWICE DAILY WITH A MEAL 180 tablet 2  . cetirizine (ZYRTEC) 10 MG chewable tablet Chew 10 mg by mouth daily.    . dapagliflozin propanediol (FARXIGA) 10 MG TABS tablet Take 1 tablet (10 mg total) by mouth daily before breakfast. 90 tablet 3  . ENTRESTO 97-103 MG TAKE 1 TABLET BY MOUTH TWICE DAILY 180 tablet 3  . eplerenone (INSPRA)  50 MG tablet TAKE 1 TABLET(50 MG) BY MOUTH DAILY 30 tablet 11  . furosemide (LASIX) 40 MG tablet TAKE 1 TABLET BY MOUTH EVERY MORNING AND ONE-HALF TABLET EVERY EVENING 90 tablet 3  . isosorbide-hydrALAZINE (BIDIL) 20-37.5 MG tablet Take 2 tablets by mouth 3 (three) times daily. 180 tablet 6  . mometasone (NASONEX) 50 MCG/ACT nasal spray Place 2 sprays into the nose as needed.    . potassium chloride SA (KLOR-CON) 20 MEQ tablet TAKE 1 TABLET(20 MEQ) BY MOUTH DAILY 90 tablet 1  . sotalol (BETAPACE) 80 MG tablet Take 1  tablet (80 mg total) by mouth 2 (two) times daily. 180 tablet 3   No current facility-administered medications for this encounter.   BP 100/60   Pulse 64   Wt 125.6 kg (277 lb)   SpO2 92%   BMI 38.63 kg/m   General: NAD Neck: Thick. No JVD, no thyromegaly or thyroid nodule.  Lungs: Clear to auscultation bilaterally with normal respiratory effort. CV: Nondisplaced PMI.  Heart regular S1/S2, no S3/S4, no murmur.  No peripheral edema.  No carotid bruit.  Normal pedal pulses.  Abdomen: Soft, nontender, no hepatosplenomegaly, no distention.  Skin: Intact without lesions or rashes.  Neurologic: Alert and oriented x 3.  Psych: Normal affect. Extremities: No clubbing or cyanosis.  HEENT: Normal.   Assessment/Plan: 1. Chronic systolic CHF: Nonischemic cardiomyopathy x years.  [employer] CRT-D device.  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  Echo in 8/19 with EF 20-25%, echo in 11/20 showed EF 25-30%, echo today showed stable EF 25-30%.  CPX in 9/19 showed moderate functional limitation from heart failure.  He saw Dr. 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  He is not volume overloaded on exam, NYHA class I-II symptoms.  - Continue Lasix 40 qam/20 qpm.  BMET today.  - Continue dapagliflozin 10 mg daily.   - Continue Entresto 97/103 bid.  - Continue Coreg 25 mg bid.  - Intolerant spiro due to gynecomastia.  Continue eplerenone 50 mg daily.    - Continue Bidil 2 tabs tid.   - Taking sotalol b/c of high DFTs.  ECG showed paced QTc 511 msec (keep paced QTc < 550 msec).  2. Smoking: Discussed smoking cessation again.   - Continue bupropion.  3. Atrial Tachycardia: He is on sotalol.  4. Central sleep apnea: Continue Bipap.   - He wants followup with sleep medicine as he thinks his device needs adjustment.   Followup in 4 months.   Marca Ancona 02/03/2021

## 2021-02-03 NOTE — Progress Notes (Incomplete)
[name]      Patient Care Team: Grayce Sessions, NP as PCP - General (Internal Medicine) Quintella Reichert, MD as PCP - Sleep Medicine (Cardiology) Duke Salvia, MD as PCP - Electrophysiology (Cardiology)   HPI  Spencer Robertson is a 60 y.o. male is seen today in followup for nonischemic cardiomyopathy with congestive heart failure and left bundle branch block, and status post CRT-D implantation originally in November 2005 with change out September 2011 ***. The procedure was complicated by high DFT and he was started on sotalol with repeat testing demonstrating adequate defibrillation margins.   The patient denies chest pain***, shortness of breath***, nocturnal dyspnea***, orthopnea*** or peripheral edema***.  There have been no palpitations***, lightheadedness*** or syncope***.    He is compliant with his CPAP.  There is an issue with the mask.  Notes were reviewed from his visit with Dr. Carolanne Grumbling 4/19.   Date Cr K Hgb  6/18    16.8  8/20/  1.34 4.4       Past Medical History:  Diagnosis Date  . CHF (congestive heart failure) (HCC)   . Hyperlipidemia   . Hypertension   . Mitral regurgitation   . Nonischemic cardiomyopathy (HCC)   . Obesity   . Obesity (BMI 30-39.9) 09/01/2015  . OSA (obstructive sleep apnea)    severe  . S/P implantation of automatic cardioverter/defibrillator (AICD)    Guidant Contact H177  . Tobacco abuse     Past Surgical History:  Procedure Laterality Date  . BIV ICD GENERATOR CHANGEOUT N/A 10/06/2020   Procedure: BIV ICD GENERATOR CHANGEOUT;  Surgeon: Duke Salvia, MD;  Location: [facility] INVASIVE CV LAB;  Service: Cardiovascular;  Laterality: N/A;  . CARDIAC DEFIBRILLATOR PLACEMENT     Guidant Contak H177 device     Current Outpatient Medications  Medication Sig Dispense Refill  . aspirin 81 MG tablet Take 81 mg by mouth daily.    Marland Kitchen atorvastatin (LIPITOR) 80 MG tablet Take 1 tablet (80 mg total) by mouth daily. 90 tablet 3  .  buPROPion (WELLBUTRIN SR) 150 MG 12 hr tablet Take 1 tablet (150 mg total) by mouth daily for 3 days, THEN 1 tablet (150 mg total) 2 (two) times daily. 60 tablet 3  . carvedilol (COREG) 25 MG tablet TAKE 1 TABLET BY MOUTH TWICE DAILY WITH A MEAL 180 tablet 2  . cetirizine (ZYRTEC) 10 MG chewable tablet Chew 10 mg by mouth daily.    . dapagliflozin propanediol (FARXIGA) 10 MG TABS tablet Take 1 tablet (10 mg total) by mouth daily before breakfast. 90 tablet 3  . ENTRESTO 97-103 MG TAKE 1 TABLET BY MOUTH TWICE DAILY 180 tablet 3  . eplerenone (INSPRA) 50 MG tablet TAKE 1 TABLET(50 MG) BY MOUTH DAILY 30 tablet 11  . furosemide (LASIX) 40 MG tablet TAKE 1 TABLET BY MOUTH EVERY MORNING AND ONE-HALF TABLET EVERY EVENING 90 tablet 3  . isosorbide-hydrALAZINE (BIDIL) 20-37.5 MG tablet Take 2 tablets by mouth 3 (three) times daily. 180 tablet 6  . mometasone (NASONEX) 50 MCG/ACT nasal spray Place 2 sprays into the nose as needed.    . potassium chloride SA (KLOR-CON) 20 MEQ tablet TAKE 1 TABLET(20 MEQ) BY MOUTH DAILY 90 tablet 1  . sotalol (BETAPACE) 80 MG tablet Take 1 tablet (80 mg total) by mouth 2 (two) times daily. 180 tablet 3   No current facility-administered medications for this visit.    Allergies  Allergen Reactions  . Spironolactone Other (See  Comments)    gynecomastia    Review of Systems negative except from HPI and PMH  Physical Exam There were no vitals taken for this visit. Well developed and well nourished in no acute distress HENT normal Neck supple with JVP-flat Clear Device pocket well healed; without hematoma or erythema.  There is no tethering  Regular rate and rhythm, no *** gallop No ***/*** murmur Abd-soft with active BS No Clubbing cyanosis *** edema Skin-warm and dry A & Oriented  Grossly normal sensory and motor function  ECG ***   Assessment and  Plan  NICM  CHF chronic systolic  Hypertension  Morbid obesity  Sleep apnea  Atrial tachycardia    Nonsustained ventricular tachycardia  Implantable defibrillator-CRT-[employer]

## 2021-02-03 NOTE — Patient Instructions (Addendum)
EKG done today.  Labs done today. We will contact you only if your labs are abnormal.  No medication changes were made. Please continue all current medications as prescribed.  Your physician recommends that you schedule a follow-up appointment in: 4 months with our [facility] here in office.   If you have any questions or concerns before your next appointment please send us a message through mychart or call our office at [phone].    TO LEAVE A MESSAGE FOR THE NURSE SELECT OPTION 2, PLEASE LEAVE A MESSAGE INCLUDING: . YOUR NAME . DATE OF BIRTH . CALL BACK NUMBER . REASON FOR CALL**this is important as we prioritize the call backs  YOU WILL RECEIVE A CALL BACK THE SAME DAY AS LONG AS YOU CALL BEFORE 4:00 PM   Do the following things EVERYDAY: 1) Weigh yourself in the morning before breakfast. Write it down and keep it in a log. 2) Take your medicines as prescribed 3) Eat low salt foods--Limit salt (sodium) to 2000 mg per day.  4) Stay as active as you can everyday 5) Limit all fluids for the day to less than 2 liters   At the [facility], you and your health needs are our priority. As part of our continuing mission to provide you with exceptional heart care, we have created designated Provider Care Teams. These Care Teams include your primary Cardiologist (physician) and Advanced Practice Providers (APPs- Physician Assistants and Nurse Practitioners) who all work together to provide you with the care you need, when you need it.   You may see any of the following providers on your designated Care Team at your next follow up: . Dr Daniel Bensimhon . Dr Dalton McLean . Amy Clegg, NP . Brittainy Simmons, PA . Lauren Kemp, PharmD   Please be sure to bring in all your medications bottles to every appointment.   

## 2021-02-03 NOTE — Progress Notes (Signed)
  Echocardiogram 2D Echocardiogram has been performed.  Spencer Robertson 02/03/2021, 10:53 AM

## 2021-02-10 NOTE — Progress Notes (Signed)
Date:  02/11/2021   ID:  Spencer Robertson, DOB [birth year], MRN [id number]   PCP:  Spencer Sessions, NP  Sleep Medicine:  Spencer Magic, MD Electrophysiologist:  Spencer Manges, MD   Chief Complaint:  OSA  History of Present Illness:    Spencer Robertson is a 60 y.o. male with a hx of severe OSA who had been noncompliant with PAP in the past. He was he was referred by Spencer Hoof, PA-C for repeat sleep study as he was not using his PAP.He was found to have severe obstructive sleep apnea with an AHI of 100.1. He also had severe central sleep apnea with a central apnea index of 40.4/h. He had nocturnal hypoxemia with oxygen saturations as low as 61%. He underwent CPAP titration but could not be adequately titrated due to ongoing respiratory events and was switched over to BiPAP. He could not be adequately controlled on BiPAP therapy as well due to ongoing respiratory events and was placed on auto BiPAP titration at home but still had an elevated AHI.  He went back to sleep lab and was titrated on BiPAP to BiPAP S/T at 23/18cm H2O.   When I saw him last a year ago his AHI was still elevated and he had a large mask leak.  It was also noted that his device was not set on BiPAP S/T at 23/19cm H2O and was on a lower pressure.  His device settings were adjusted and he is now back for followup.   He tells me that he stopped using his device about a year ago as he says that it was messing his stomach up.  He says that he would wake up in the am and his abdomen would feel hard and bloated and felt like he was full of gas.  He denies feel fatigued in the am but will dose off watching TV.  He denies any am HAs.   Prior CV studies:   The following studies were reviewed today:  PAP compliance download  Past Medical History:  Diagnosis Date  . CHF (congestive heart failure) (HCC)   . Hyperlipidemia   . Hypertension   . Mitral regurgitation   . Nonischemic cardiomyopathy (HCC)   . Obesity   . Obesity  (BMI 30-39.9) 09/01/2015  . OSA (obstructive sleep apnea)    severe  . S/P implantation of automatic cardioverter/defibrillator (AICD)    Guidant Contact H177  . Tobacco abuse    Past Surgical History:  Procedure Laterality Date  . BIV ICD GENERATOR CHANGEOUT N/A 10/06/2020   Procedure: BIV ICD GENERATOR CHANGEOUT;  Surgeon: Spencer Salvia, MD;  Location: [facility] INVASIVE CV LAB;  Service: Cardiovascular;  Laterality: N/A;  . CARDIAC DEFIBRILLATOR PLACEMENT     Guidant Contak H177 device      Current Meds  Medication Sig  . aspirin 81 MG tablet Take 81 mg by mouth daily.  Marland Kitchen atorvastatin (LIPITOR) 80 MG tablet Take 1 tablet (80 mg total) by mouth daily.  Marland Kitchen buPROPion (WELLBUTRIN SR) 150 MG 12 hr tablet Take 1 tablet (150 mg total) by mouth daily for 3 days, THEN 1 tablet (150 mg total) 2 (two) times daily.  . carvedilol (COREG) 25 MG tablet TAKE 1 TABLET BY MOUTH TWICE DAILY WITH A MEAL  . cetirizine (ZYRTEC) 10 MG chewable tablet Chew 10 mg by mouth daily.  . dapagliflozin propanediol (FARXIGA) 10 MG TABS tablet Take 1 tablet (10 mg total) by mouth daily before breakfast.  . ENTRESTO 97-103  MG TAKE 1 TABLET BY MOUTH TWICE DAILY  . eplerenone (INSPRA) 50 MG tablet TAKE 1 TABLET(50 MG) BY MOUTH DAILY  . furosemide (LASIX) 40 MG tablet TAKE 1 TABLET BY MOUTH EVERY MORNING AND ONE-HALF TABLET EVERY EVENING  . isosorbide-hydrALAZINE (BIDIL) 20-37.5 MG tablet Take 1 tablet by mouth in the morning and at bedtime.  . mometasone (NASONEX) 50 MCG/ACT nasal spray Place 2 sprays into the nose as needed.  . potassium chloride SA (KLOR-CON) 20 MEQ tablet TAKE 1 TABLET(20 MEQ) BY MOUTH DAILY  . sotalol (BETAPACE) 80 MG tablet Take 1 tablet (80 mg total) by mouth 2 (two) times daily.  . [DISCONTINUED] isosorbide-hydrALAZINE (BIDIL) 20-37.5 MG tablet Take 2 tablets by mouth 3 (three) times daily.     Allergies:   Spironolactone   Social History   Tobacco Use  . Smoking status: Current Some Day Smoker     Packs/day: 0.25    Years: 20.00    Pack years: 5.00    Types: Cigarettes  . Smokeless tobacco: Never Used  Vaping Use  . Vaping Use: Never used  Substance Use Topics  . Alcohol use: Yes    Comment: socially  . Drug use: Yes    Types: Marijuana    Comment: occasional     Family Hx: The patient's family history includes Cardiomyopathy in his father; Heart disease in his brother and sister; Hypertension in an other family member.  ROS:   Please see the history of present illness.     All other systems reviewed and are negative.   Labs/Other Tests and Data Reviewed:    Recent Labs: 06/23/2020: ALT 13 10/02/2020: Hemoglobin 16.8; Platelets 118 02/03/2021: BUN 26; Creatinine, Ser 1.41; Potassium 4.4; Sodium 140   Recent Lipid Panel Lab Results  Component Value Date/Time   CHOL 134 08/29/2020 08:35 AM   TRIG 337 (H) 08/29/2020 08:35 AM   HDL 26 (L) 08/29/2020 08:35 AM   CHOLHDL 5.2 (H) 08/29/2020 08:35 AM   CHOLHDL 3.5 09/13/2007 04:05 AM   LDLCALC 56 08/29/2020 08:35 AM    Wt Readings from Last 3 Encounters:  02/11/21 275 lb 6.4 oz (124.9 kg)  02/03/21 277 lb (125.6 kg)  10/06/20 273 lb (123.8 kg)     Objective:    Vital Signs:  BP (!) 70/50   Pulse 77   Ht 5\' 11"  (1.803 m)   Wt 275 lb 6.4 oz (124.9 kg)   SpO2 91%   BMI 38.41 kg/m    GEN: Well nourished, well developed in no acute distress HEENT: Normal NECK: No JVD; No carotid bruits LYMPHATICS: No lymphadenopathy CARDIAC:RRR, no murmurs, rubs, gallops RESPIRATORY:  Clear to auscultation without rales, wheezing or rhonchi  ABDOMEN: Soft, non-tender, non-distended MUSCULOSKELETAL:  No edema; No deformity  SKIN: Warm and dry NEUROLOGIC:  Alert and oriented x 3 PSYCHIATRIC:  Normal affect    ASSESSMENT & PLAN:    1.  OSA  -he has a hx of complex OSA with significant Central apnea as well. -he was intolerant to BiPAP and has not used it in over a year due to severe abdominal bloating -his last sleep  study was over 3 years ago -he is not a candidate for the hypoglossal nerve stimulator due to morbid obesity -He is not a candidate for BiPAP ASV due to LV dysfunction -I will repeat a split night sleep study and if we cannot get him adequately titrated then will refer to pulmonary  2.  Hypotension -BP is very low  this am but he is asymptomatic>>he took all his meds this am -he is completely asymptomatic today -BP was 100/56mmHg on OV with Dr. Shirlee Latch 02/03/2021 -discussed with Dr. Shirlee Latch and will have him hold all his afternoon meds and then tomorrow decrease Bidil to 1 tablet BID -Followup Bp check in AHF clinic on Friday -continue Carvedilol 25mg  BID, Entresto 97-103mg  BID and Eplerenone 50mg  daily  3.  Morbid Obesity -I have encouraged him to get into a routine exercise program and cut back on carbs and portions.    Medication Adjustments/Labs and Tests Ordered: Current medicines are reviewed at length with the patient today.  Concerns regarding medicines are outlined above.  Tests Ordered: Orders Placed This Encounter  Procedures  . Split night study   Medication Changes: Meds ordered this encounter  Medications  . isosorbide-hydrALAZINE (BIDIL) 20-37.5 MG tablet    Sig: Take 1 tablet by mouth in the morning and at bedtime.    Dispense:  180 tablet    Refill:  3    Disposition:  Follow up in 1 year(s)  Signed, , MD  02/11/2021 10:38 AM    [facility]

## 2021-02-11 ENCOUNTER — Ambulatory Visit (INDEPENDENT_AMBULATORY_CARE_PROVIDER_SITE_OTHER): Payer: Medicaid Other | Admitting: Cardiology

## 2021-02-11 ENCOUNTER — Ambulatory Visit: Payer: Medicaid Other | Admitting: Cardiology

## 2021-02-11 ENCOUNTER — Encounter: Payer: Self-pay | Admitting: Cardiology

## 2021-02-11 ENCOUNTER — Telehealth: Payer: Self-pay | Admitting: *Deleted

## 2021-02-11 ENCOUNTER — Other Ambulatory Visit: Payer: Self-pay

## 2021-02-11 VITALS — BP 70/50 | HR 77 | Ht 71.0 in | Wt 275.4 lb

## 2021-02-11 DIAGNOSIS — I10 Essential (primary) hypertension: Secondary | ICD-10-CM | POA: Diagnosis not present

## 2021-02-11 DIAGNOSIS — G4733 Obstructive sleep apnea (adult) (pediatric): Secondary | ICD-10-CM

## 2021-02-11 MED ORDER — BIDIL 20-37.5 MG PO TABS: 1.0000 | ORAL_TABLET | Freq: Two times a day (BID) | ORAL | 3 refills | Status: DC

## 2021-02-11 NOTE — Patient Instructions (Addendum)
Medication Instructions:  Your physician has recommended you make the following change in your medication:  1) HOLD ALL MEDICATIONS TODAY 2) STARTING TOMORROW: Decrease Bidil to 20-37.5 (1 tablet) twice daily   *If you need a refill on your cardiac medications before your next appointment, please call your pharmacy*   Testing/Procedures: Your physician has recommended that you have a sleep study. This test records several body functions during sleep, including: brain activity, eye movement, oxygen and carbon dioxide blood levels, heart rate and rhythm, breathing rate and rhythm, the flow of air through your mouth and nose, snoring, body muscle movements, and chest and belly movement.   Follow-Up: At [facility], you and your health needs are our priority.  As part of our continuing mission to provide you with exceptional heart care, we have created designated Provider Care Teams.  These Care Teams include your primary Cardiologist (physician) and Advanced Practice Providers (APPs -  Physician Assistants and Nurse Practitioners) who all work together to provide you with the care you need, when you need it.  Follow up with Dr. Mayford Knife after sleep study.   Other Instructions Blood pressure check at [facility] on Friday 3/11

## 2021-02-11 NOTE — Telephone Encounter (Signed)
PA request for sleep study submitted to [facility] via fax to [phone]. Pending case # [id number].

## 2021-02-12 ENCOUNTER — Telehealth (HOSPITAL_COMMUNITY): Payer: Self-pay | Admitting: *Deleted

## 2021-02-12 NOTE — Telephone Encounter (Signed)
Spoke with pt-appt scheduled

## 2021-02-12 NOTE — Telephone Encounter (Signed)
-----   Message from Laurey Morale, MD sent at 02/11/2021  5:36 PM EST ----- Needs nursing visit Friday for BP check (was hypotensive, adjusted meds).   ----- Message ----- From: Quintella Reichert, MD Sent: 02/11/2021  10:39 AM EST To: Laurey Morale, MD  This is the patient that needs BP check in your office on Friday - I think my nurse got him an appt

## 2021-02-13 ENCOUNTER — Ambulatory Visit (HOSPITAL_COMMUNITY)
Admission: RE | Admit: 2021-02-13 | Discharge: 2021-02-13 | Disposition: A | Discharge status: Home / Self Care | Payer: Medicaid Other | Source: Ambulatory Visit | Attending: Cardiology | Admitting: Cardiology

## 2021-02-13 ENCOUNTER — Other Ambulatory Visit: Payer: Self-pay

## 2021-02-13 VITALS — BP 94/68 | HR 61 | Wt 279.2 lb

## 2021-02-13 DIAGNOSIS — I5022 Chronic systolic (congestive) heart failure: Secondary | ICD-10-CM

## 2021-02-13 DIAGNOSIS — Z0130 Encounter for examination of blood pressure without abnormal findings: Secondary | ICD-10-CM | POA: Diagnosis present

## 2021-02-13 NOTE — Progress Notes (Signed)
Patient seen in clinic for nurse visit/bp check today. On 3/9 patient had a bp reading of 70/50 patient was asymptomatic but was told to hold evening meds. Today bp 94/68 patient asymptomatic. Patient was given a blood pressure monitor to record bp reading x 1 week and call our office with results. Pt advised to contact our office if systolic bp is below 90 and if he becomes dizzy or light headed. Pt aware and agreeable with plan.

## 2021-02-19 ENCOUNTER — Telehealth: Payer: Self-pay | Admitting: *Deleted

## 2021-02-19 DIAGNOSIS — G4733 Obstructive sleep apnea (adult) (pediatric): Secondary | ICD-10-CM

## 2021-02-19 NOTE — Telephone Encounter (Signed)
Theresia Majors, RN  P Cv Div Sleep Studies; Reesa Chew, CMA Split night sleep study on BiPAP has been ordered.  Thanks!  Carly

## 2021-02-19 NOTE — Telephone Encounter (Signed)
-----   Message from Gaynelle Cage, CMA sent at 02/19/2021  9:13 AM EDT ----- Sleep study has been denied by patient's insurance. I will fax it over for TT to read the denial reason and do appeal if she chooses. ----- Message ----- From: Theresia Majors, RN Sent: 02/11/2021  10:15 AM EDT To: Reesa Chew, CMA, Cv Div Sleep Studies  Split night sleep study on BiPAP has been ordered.  Thanks! Carly

## 2021-02-19 NOTE — Telephone Encounter (Signed)
Staff message sent to [place] patient's sleep study has been denied by [facility]. I am faxing over the denial for the MD to review and information to appeal if she chooses to do so.

## 2021-02-22 ENCOUNTER — Other Ambulatory Visit (HOSPITAL_COMMUNITY): Payer: Self-pay | Admitting: Cardiology

## 2021-02-23 ENCOUNTER — Ambulatory Visit (INDEPENDENT_AMBULATORY_CARE_PROVIDER_SITE_OTHER): Payer: Medicaid Other | Admitting: Primary Care

## 2021-02-24 ENCOUNTER — Other Ambulatory Visit: Payer: Self-pay

## 2021-02-24 ENCOUNTER — Encounter (INDEPENDENT_AMBULATORY_CARE_PROVIDER_SITE_OTHER): Payer: Self-pay | Admitting: Family Medicine

## 2021-02-24 ENCOUNTER — Ambulatory Visit (INDEPENDENT_AMBULATORY_CARE_PROVIDER_SITE_OTHER): Payer: Medicaid Other | Admitting: Family Medicine

## 2021-02-24 VITALS — BP 87/56 | HR 66 | Temp 97.3°F | Ht 71.0 in | Wt 275.4 lb

## 2021-02-24 DIAGNOSIS — F17200 Nicotine dependence, unspecified, uncomplicated: Secondary | ICD-10-CM

## 2021-02-24 DIAGNOSIS — I952 Hypotension due to drugs: Secondary | ICD-10-CM

## 2021-02-24 DIAGNOSIS — I5022 Chronic systolic (congestive) heart failure: Secondary | ICD-10-CM

## 2021-02-24 DIAGNOSIS — R7309 Other abnormal glucose: Secondary | ICD-10-CM | POA: Diagnosis not present

## 2021-02-24 DIAGNOSIS — R7303 Prediabetes: Secondary | ICD-10-CM

## 2021-02-24 DIAGNOSIS — E785 Hyperlipidemia, unspecified: Secondary | ICD-10-CM

## 2021-02-24 LAB — POCT GLYCOSYLATED HEMOGLOBIN (HGB A1C): Hemoglobin A1C: 6.4 % — AB (ref 4.0–5.6)

## 2021-02-24 NOTE — Telephone Encounter (Signed)
Awaiting TT decision on the denial.

## 2021-02-24 NOTE — Progress Notes (Signed)
Patient ID: Spencer Robertson, male    DOB: [birth year], 60 y.o.   MRN: [id number]  PCP: Grayce Sessions, NP  Chief Complaint  Patient presents with  . Prediabetes    Subjective:  HPI  Spencer Robertson is a 60 y.o. male presents for A1C check.  Dontavis has Dilated cardiomyopathy (HCC); SYSTOLIC HEART FAILURE, CHRONIC; OSA (obstructive sleep apnea); Implantable cardioverter-defibrillator (ICD) in situ; Tobacco abuse; Orthostatic lightheadedness; Benign essential HTN; and Obesity (BMI 30-39.9) on their problem list.   Hypotension/Systolic HF Hypotensive on arrival. Patient followed by cardiology for HF and has ICD in place. Last seen 02/13/21 for BP check and was hypotensive, asymptomatic. Hypotensive today asymptomatic. Per patient checks BP daily and pressure Low 100's systolic and 60's diastolic. No dizziness, blurry vision, falls. No CP or SOB.  Prediabetes  Diabetes (6.4 ) today. Last A1C 5.9 Exercising by walking more.  No significant changes to diet. Denies polyuria, polyphagia, or polydipsia Currently managed with Farxiga   Tobacco use Pt is current daily smoker. Motivated to cut back at present. Currently down to 5 cigarettes per day. No chronic recurrent cough, worsening SOB or wheezing.   Mixed Hyperlipidemia  Patient is non fasting today. Abnormal lipid panel 6 months with significantly elevated triglycerides. Pt with know comorbid conditions-HF, hx Afib, hypertension, DM2. obesity Compliant with medication.  Social History   Socioeconomic History  . Marital status: Single    Spouse name: Not on file  . Number of children: Not on file  . Years of education: Not on file  . Highest education level: Not on file  Occupational History  . Occupation: disabled  Tobacco Use  . Smoking status: Current Some Day Smoker    Packs/day: 0.25    Years: 20.00    Pack years: 5.00    Types: Cigarettes  . Smokeless tobacco: Never Used  Vaping Use  . Vaping Use: Never used   Substance and Sexual Activity  . Alcohol use: Yes    Comment: socially  . Drug use: Yes    Types: Marijuana    Comment: occasional  . Sexual activity: Not on file  Other Topics Concern  . Not on file  Social History Narrative  . Not on file   Social Determinants of Health   Financial Resource Strain: Not on file  Food Insecurity: Not on file  Transportation Needs: Not on file  Physical Activity: Not on file  Stress: Not on file  Social Connections: Not on file  Intimate Partner Violence: Not on file    Family History  Problem Relation Age of Onset  . Cardiomyopathy Father   . Hypertension Other   . Heart disease Sister   . Heart disease Brother    Review of Systems Pertinent negatives listed in HPI  Allergies  Allergen Reactions  . Spironolactone Other (See Comments)    gynecomastia    Prior to Admission medications   Medication Sig Start Date End Date Taking? Authorizing Provider  aspirin 81 MG tablet Take 81 mg by mouth daily.   Yes [provider]  atorvastatin (LIPITOR) 80 MG tablet Take 1 tablet (80 mg total) by mouth daily. 09/01/20  Yes Grayce Sessions, NP  buPROPion (WELLBUTRIN SR) 150 MG 12 hr tablet Take 1 tablet (150 mg total) by mouth daily for 3 days, THEN 1 tablet (150 mg total) 2 (two) times daily. 09/30/20 10/03/21 Yes Laurey Morale, MD  carvedilol (COREG) 25 MG tablet TAKE 1 TABLET BY MOUTH TWICE DAILY  WITH A MEAL 01/22/21  Yes Duke Salvia, MD  cetirizine (ZYRTEC) 10 MG chewable tablet Chew 10 mg by mouth daily.   Yes [provider]  ENTRESTO 97-103 MG TAKE 1 TABLET BY MOUTH TWICE DAILY 10/01/20  Yes Laurey Morale, MD  eplerenone (INSPRA) 50 MG tablet TAKE 1 TABLET(50 MG) BY MOUTH DAILY 09/09/20  Yes Laurey Morale, MD  FARXIGA 10 MG TABS tablet TAKE 1 TABLET(10 MG) BY MOUTH DAILY BEFORE BREAKFAST 02/24/21  Yes Laurey Morale, MD  furosemide (LASIX) 40 MG tablet TAKE 1 TABLET BY MOUTH EVERY MORNING AND ONE-HALF  TABLET EVERY EVENING 11/17/20  Yes Laurey Morale, MD  isosorbide-hydrALAZINE (BIDIL) 20-37.5 MG tablet Take 1 tablet by mouth in the morning and at bedtime. 02/11/21  Yes Turner, Cornelious Bryant, MD  mometasone (NASONEX) 50 MCG/ACT nasal spray Place 2 sprays into the nose as needed.   Yes [provider]  potassium chloride SA (KLOR-CON) 20 MEQ tablet TAKE 1 TABLET(20 MEQ) BY MOUTH DAILY 11/24/20  Yes Laurey Morale, MD  sotalol (BETAPACE) 80 MG tablet Take 1 tablet (80 mg total) by mouth 2 (two) times daily. 05/08/20  Yes Laurey Morale, MD    Past Medical, Surgical Family and Social History reviewed and updated.    Objective:   Today's Vitals   02/24/21 1026  BP: (!) 87/56  Pulse: 66  Temp: (!) 97.3 F (36.3 C)  TempSrc: Temporal  SpO2: 93%  Weight: 275 lb 6.4 oz (124.9 kg)  Height: 5\' 11"  (1.803 m)  PainSc: 0-No pain    BP Readings from Last 3 Encounters:  02/24/21 (!) 87/56  02/13/21 94/68  02/11/21 (!) 70/50    Filed Weights   02/24/21 1026  Weight: 275 lb 6.4 oz (124.9 kg)       Physical Exam Constitutional:      General: He is not in acute distress.    Appearance: He is obese. He is not ill-appearing or diaphoretic.  Cardiovascular:     Rate and Rhythm: Normal rate and regular rhythm.  Pulmonary:     Effort: Pulmonary effort is normal.     Comments: Generalized diminished lungs sounds (all fields) CTABL Abdominal:     General: There is distension.     Palpations: Abdomen is soft.  Musculoskeletal:     Cervical back: Normal range of motion.  Skin:    Capillary Refill: Capillary refill takes less than 2 seconds.  Neurological:     General: No focal deficit present.     Mental Status: He is alert and oriented to person, place, and time.     Motor: No weakness.     Gait: Gait normal.  Psychiatric:        Mood and Affect: Mood normal.        Behavior: Behavior normal.        Thought Content: Thought content normal.        Judgment: Judgment normal.     .  No results found for: POCGLU  Lab Results  Component Value Date   HGBA1C 5.9 (A) 08/25/2020            Assessment & Plan:  1. Prediabetes 2. Elevated hemoglobin A1c HbA1C today increased 6.4 Continue Farxiga and routine physical activity   3. Hypotension due to medication -Asymptomatic today, however patient advised to follow-up with cardiology if BP remains less than 90/60  4. SYSTOLIC HEART FAILURE, CHRONIC, managed by cardiology  5. Tobacco use disorder -Counseled  and educated on cessations   6. Hyperlipidemia, unspecified hyperlipidemia type - Lipid panel obtained non fasting     Follow-up in 6 months, repeat fasting lipid, A1C check, and overdue health maintenance.

## 2021-02-24 NOTE — Patient Instructions (Signed)
A1C today 6.4. continue to increase exercise. Follow-up with cardiology if your blood pressure continue to be less than 90/60 (today 87/56) or if you develop any dizziness.       Managing the Challenge of Quitting Smoking Quitting smoking is a physical and mental challenge. You will face cravings, withdrawal symptoms, and temptation. Before quitting, work with your health care provider to make a plan that can help you manage quitting. Preparation can help you quit and keep you from giving in. How to manage lifestyle changes Managing stress Stress can make you want to smoke, and wanting to smoke may cause stress. It is important to find ways to manage your stress. You might try some of the following:  Practice relaxation techniques. ? Breathe slowly and deeply, in through your nose and out through your mouth. ? Listen to music. ? Soak in a bath or take a shower. ? Imagine a peaceful place or vacation.  Get some support. ? Talk with family or friends about your stress. ? Join a support group. ? Talk with a counselor or therapist.  Get some physical activity. ? Go for a walk, run, or bike ride. ? Play a favorite sport. ? Practice yoga.   Medicines Talk with your health care provider about medicines that might help you deal with cravings and make quitting easier for you. Relationships Social situations can be difficult when you are quitting smoking. To manage this, you can:  Avoid parties and other social situations where people might be smoking.  Avoid alcohol.  Leave right away if you have the urge to smoke.  Explain to your family and friends that you are quitting smoking. Ask for support and let them know you might be a bit grumpy.  Plan activities where smoking is not an option. General instructions Be aware that many people gain weight after they quit smoking. However, not everyone does. To keep from gaining weight, have a plan in place before you quit and stick to the  plan after you quit. Your plan should include:  Having healthy snacks. When you have a craving, it may help to: ? Eat popcorn, carrots, celery, or other cut vegetables. ? Chew sugar-free gum.  Changing how you eat. ? Eat small portion sizes at meals. ? Eat 4-6 small meals throughout the day instead of 1-2 large meals a day. ? Be mindful when you eat. Do not watch television or do other things that might distract you as you eat.  Exercising regularly. ? Make time to exercise each day. If you do not have time for a long workout, do short bouts of exercise for 5-10 minutes several times a day. ? Do some form of strengthening exercise, such as weight lifting. ? Do some exercise that gets your heart beating and causes you to breathe deeply, such as walking fast, running, swimming, or biking. This is very important.  Drinking plenty of water or other low-calorie or no-calorie drinks. Drink 6-8 glasses of water daily.   How to recognize withdrawal symptoms Your body and mind may experience discomfort as you try to get used to not having nicotine in your system. These effects are called withdrawal symptoms. They may include:  Feeling hungrier than normal.  Having trouble concentrating.  Feeling irritable or restless.  Having trouble sleeping.  Feeling depressed.  Craving a cigarette. To manage withdrawal symptoms:  Avoid places, people, and activities that trigger your cravings.  Remember why you want to quit.  Get plenty of sleep.  Avoid coffee and other caffeinated drinks. These may worsen some of your symptoms. These symptoms may surprise you. But be assured that they are normal to have when quitting smoking. How to manage cravings Come up with a plan for how to deal with your cravings. The plan should include the following:  A definition of the specific situation you want to deal with.  An alternative action you will take.  A clear idea for how this action will  help.  The name of someone who might help you with this. Cravings usually last for 5-10 minutes. Consider taking the following actions to help you with your plan to deal with cravings:  Keep your mouth busy. ? Chew sugar-free gum. ? Suck on hard candies or a straw. ? Brush your teeth.  Keep your hands and body busy. ? Change to a different activity right away. ? Squeeze or play with a ball. ? Do an activity or a hobby, such as making bead jewelry, practicing needlepoint, or working with wood. ? Mix up your normal routine. ? Take a short exercise break. Go for a quick walk or run up and down stairs.  Focus on doing something kind or helpful for someone else.  Call a friend or family member to talk during a craving.  Join a support group.  Contact a quitline. Where to find support To get help or find a support group:  Call the National Cancer Institute's Smoking Quitline: 1-800-QUIT NOW 5792315031)  Visit the website of the Substance Abuse and Mental Health Services Administration: SkateOasis.com.pt  Text QUIT to SmokefreeTXT: 542706 Where to find more information Visit these websites to find more information on quitting smoking:  National Cancer Institute: www.smokefree.gov  American Lung Association: www.lung.org  American Cancer Society: www.cancer.org  Centers for Disease Control and Prevention: FootballExhibition.com.br  American Heart Association: www.heart.org Contact a health care provider if:  You want to change your plan for quitting.  The medicines you are taking are not helping.  Your eating feels out of control or you cannot sleep. Get help right away if:  You feel depressed or become very anxious. Summary  Quitting smoking is a physical and mental challenge. You will face cravings, withdrawal symptoms, and temptation to smoke again. Preparation can help you as you go through these challenges.  Try different techniques to manage stress, handle social situations, and  prevent weight gain.  You can deal with cravings by keeping your mouth busy (such as by chewing gum), keeping your hands and body busy, calling family or friends, or contacting a quitline for people who want to quit smoking.  You can deal with withdrawal symptoms by avoiding places where people smoke, getting plenty of rest, and avoiding drinks with caffeine. This information is not intended to replace advice given to you by your health care provider. Make sure you discuss any questions you have with your health care provider. Document Revised: 09/11/2019 Document Reviewed: 09/11/2019 Elsevier Patient Education  2021 [employer].

## 2021-02-25 LAB — LIPID PANEL
Chol/HDL Ratio: 2.8 ratio (ref 0.0–5.0)
Cholesterol, Total: 74 mg/dL — ABNORMAL LOW (ref 100–199)
HDL: 26 mg/dL — ABNORMAL LOW (ref >39)
LDL Chol Calc (NIH): 30 mg/dL (ref 0–99)
Triglycerides: 91 mg/dL (ref 0–149)
VLDL Cholesterol Cal: 18 mg/dL (ref 5–40)

## 2021-02-25 NOTE — Telephone Encounter (Signed)
Per dr Mayford Knife, bipap titration ordered

## 2021-03-02 ENCOUNTER — Telehealth (INDEPENDENT_AMBULATORY_CARE_PROVIDER_SITE_OTHER): Payer: Self-pay

## 2021-03-02 NOTE — Telephone Encounter (Signed)
Spoke with patient. After verifying date of birth he was informed that cholesterol is good. Continue monitoring intake of foods high in fat and to exercise. He verbalized understanding of results. Maryjean Morn, CMA

## 2021-03-02 NOTE — Telephone Encounter (Signed)
-----   Message from Bing Neighbors, FNP sent at 03/01/2021  8:23 PM EDT ----- Cholesterol looks great. Continue to monitor intake of food high in fat and exercise.

## 2021-03-03 ENCOUNTER — Telehealth: Payer: Self-pay

## 2021-03-03 DIAGNOSIS — G4733 Obstructive sleep apnea (adult) (pediatric): Secondary | ICD-10-CM

## 2021-03-03 NOTE — Telephone Encounter (Signed)
Theresia Majors, RN  Reesa Chew, CMA Referral has been placed.

## 2021-03-03 NOTE — Telephone Encounter (Signed)
-----   Message from Reesa Chew, CMA sent at 03/02/2021  5:51 PM EDT ----- Regarding: referral Please send referral. Thanks ----- Message ----- From: Quintella Reichert, MD Sent: 03/02/2021   2:35 PM EDT To: Reesa Chew, CMA Subject: RE: Bipap titration                            Ok then refer to Dr. Vassie Loll with pulmonary due to poorly controlled severe OSA ----- Message ----- From: Reesa Chew, CMA Sent: 03/02/2021   1:26 PM EDT To: Quintella Reichert, MD Subject: Bipap titration                                patient's sleep study has been denied by [facility] Medicaid. I am faxing over the denial for the MD to review and information to appeal if she chooses to do so.  Please advise ----- Message ----- From: Theresia Majors, RN Sent: 02/11/2021  10:15 AM EDT To: Reesa Chew, CMA, Cv Div Sleep Studies  Split night sleep study on BiPAP has been ordered.  Thanks! Carly

## 2021-04-07 ENCOUNTER — Ambulatory Visit (INDEPENDENT_AMBULATORY_CARE_PROVIDER_SITE_OTHER): Payer: Medicaid Other

## 2021-04-07 DIAGNOSIS — I420 Dilated cardiomyopathy: Secondary | ICD-10-CM

## 2021-04-07 LAB — CUP PACEART REMOTE DEVICE CHECK
Battery Remaining Longevity: 132 mo
Battery Remaining Percentage: 100 %
Brady Statistic RA Percent Paced: 35 %
Brady Statistic RV Percent Paced: 99 %
Date Time Interrogation Session: 20220503050100
HighPow Impedance: 53 Ohm
Implantable Lead Implant Date: 20051122
Implantable Lead Implant Date: 20051122
Implantable Lead Implant Date: 20051122
Implantable Lead Location: 753858
Implantable Lead Location: 753859
Implantable Lead Location: 753860
Implantable Lead Model: 158
Implantable Lead Model: 4194
Implantable Lead Model: 5076
Implantable Lead Serial Number: 159458
Implantable Pulse Generator Implant Date: 20211101
Lead Channel Impedance Value: 451 Ohm
Lead Channel Impedance Value: 524 Ohm
Lead Channel Impedance Value: 634 Ohm
Lead Channel Setting Pacing Amplitude: 2 V
Lead Channel Setting Pacing Amplitude: 2 V
Lead Channel Setting Pacing Amplitude: 2.4 V
Lead Channel Setting Pacing Pulse Width: 0.4 ms
Lead Channel Setting Pacing Pulse Width: 0.4 ms
Lead Channel Setting Sensing Sensitivity: 0.5 mV
Lead Channel Setting Sensing Sensitivity: 1 mV
Pulse Gen Serial Number: 147594

## 2021-04-08 IMAGING — CT CT RENAL STONE PROTOCOL
2 of 4 series · 17 of 46 positions shown, 19 images · non-contrast
Comparison: None.

CLINICAL DATA: Left flank pain

EXAM:
CT ABDOMEN AND PELVIS WITHOUT CONTRAST
TECHNIQUE: Multidetector CT imaging of the abdomen and pelvis was performed
following the standard protocol without IV contrast.

[Series 4: ap without · axial · non-contrast · 0.88mm/px · z∈[-123,+287]mm · 14 of 92 slices shown, 16 images]
[im 5/92  soft-tissue]
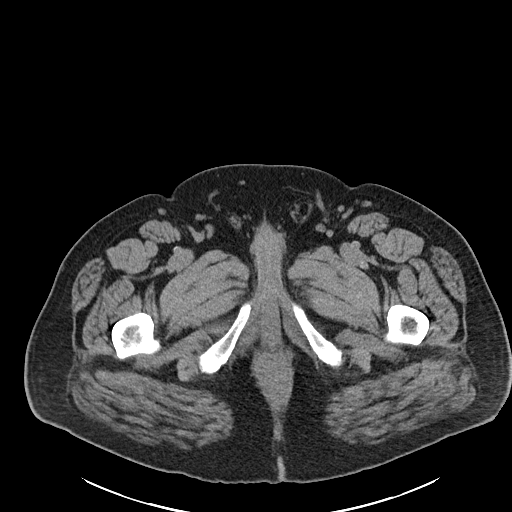
[im 5/92  bone]
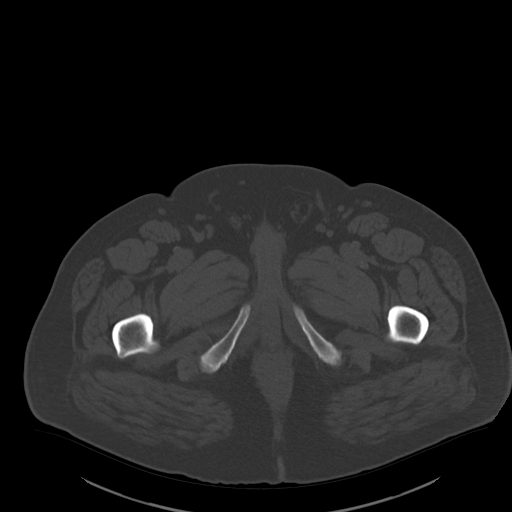
[im 14/92  soft-tissue]
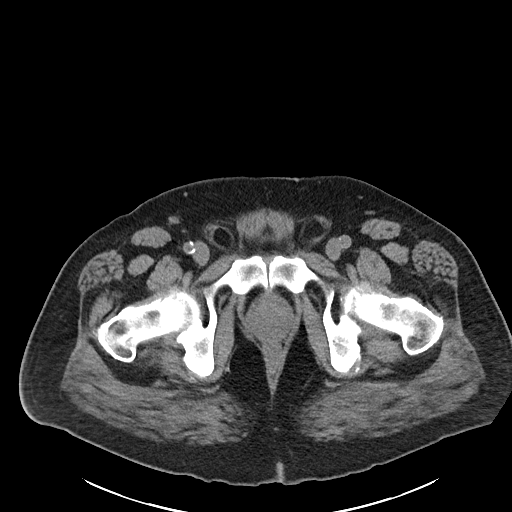
[im 19/92  soft-tissue]
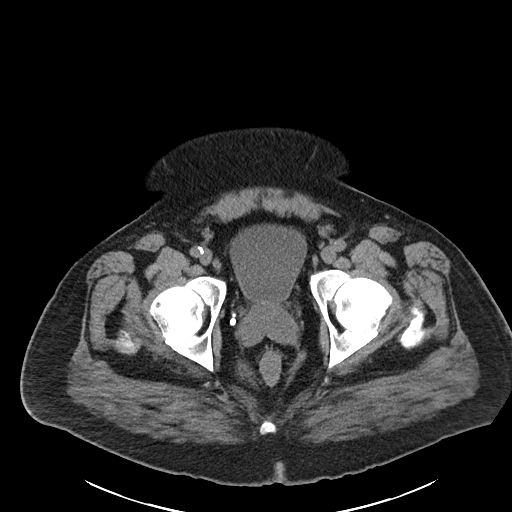
[im 23/92  soft-tissue]
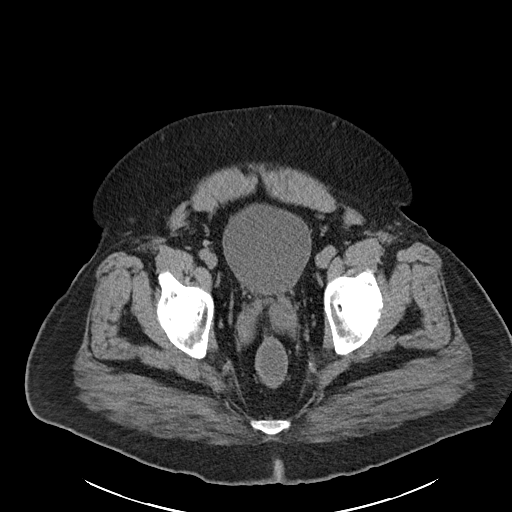
[im 32/92  soft-tissue]
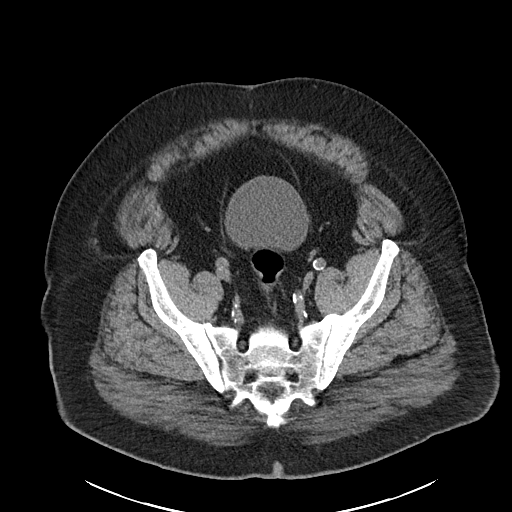
[im 37/92  soft-tissue]
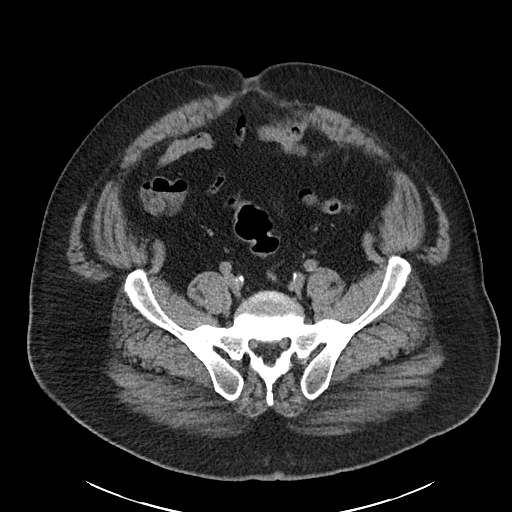
[im 41/92  soft-tissue]
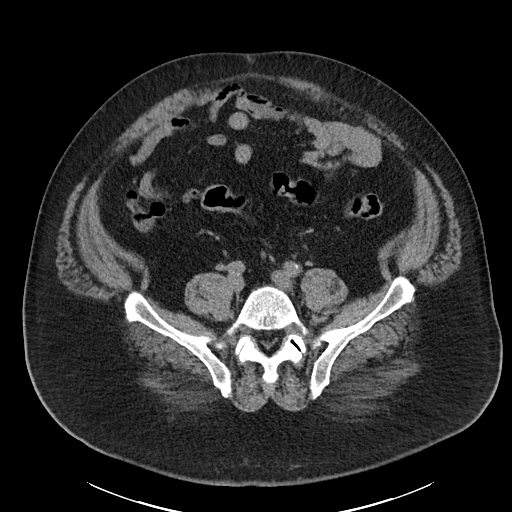
[im 51/92  soft-tissue]
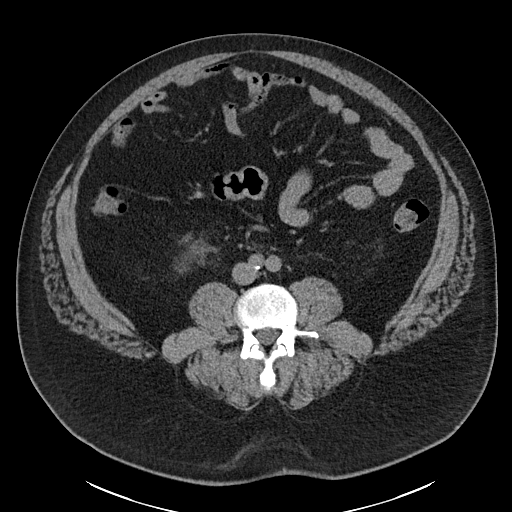
[im 55/92  soft-tissue]
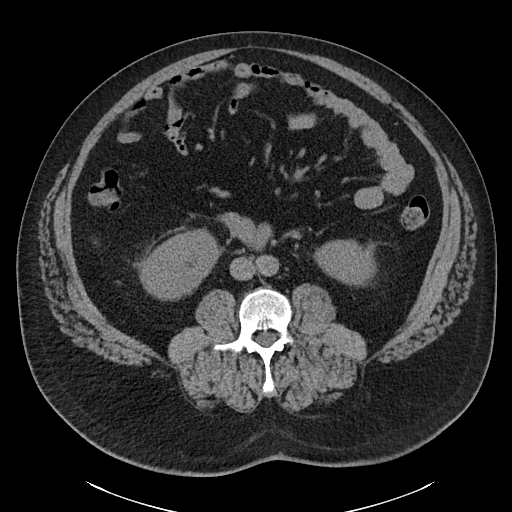
[im 55/92  bone]
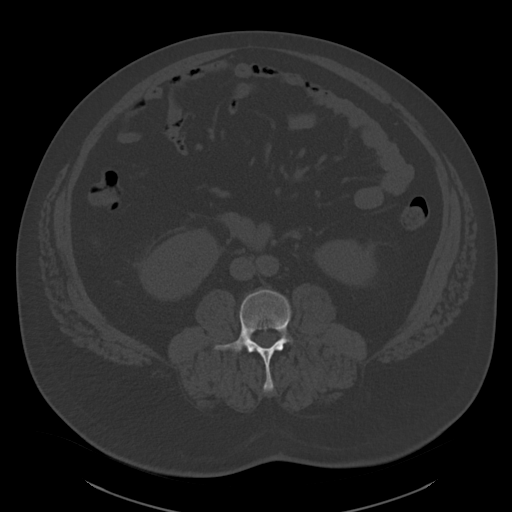
[im 60/92  soft-tissue]
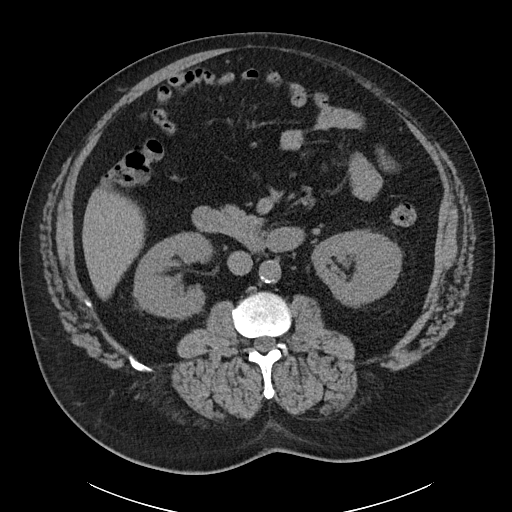
[im 69/92  soft-tissue]
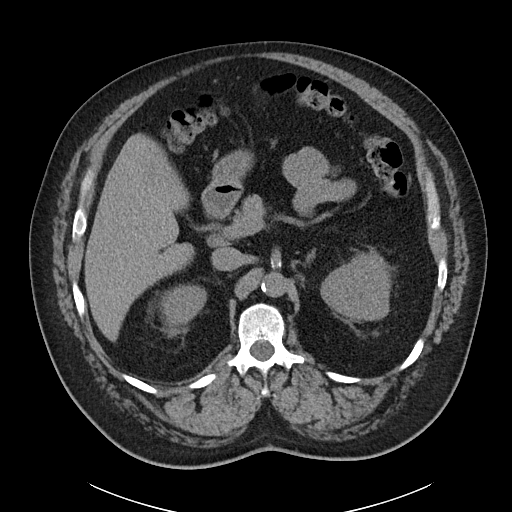
[im 73/92  soft-tissue]
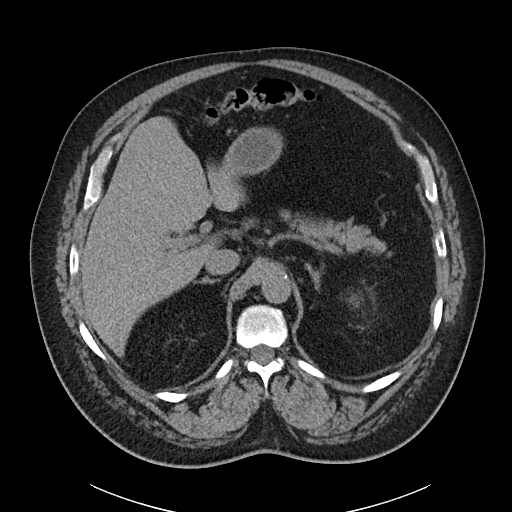
[im 78/92  soft-tissue]
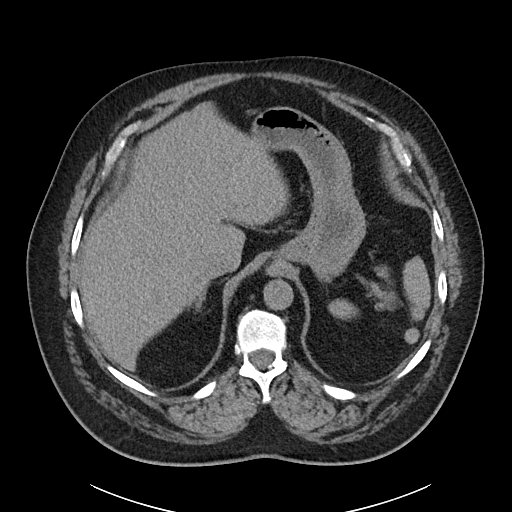
[im 87/92  soft-tissue]
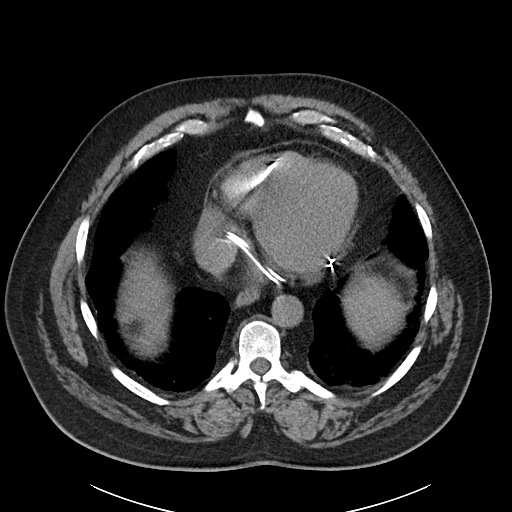

[Series 6: cor · coronal · 0.82mm/px · 3 of 135 slices shown]
[im 45/135  soft-tissue]
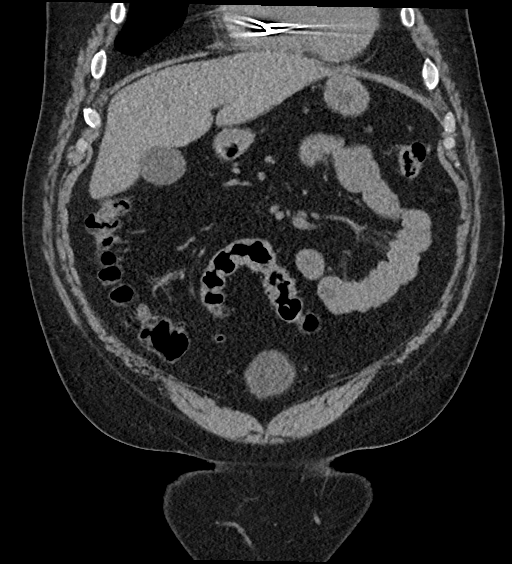
[im 60/135  soft-tissue]
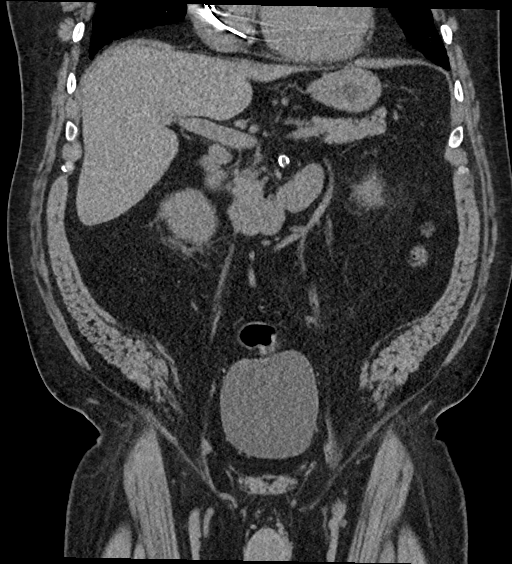
[im 75/135  soft-tissue]
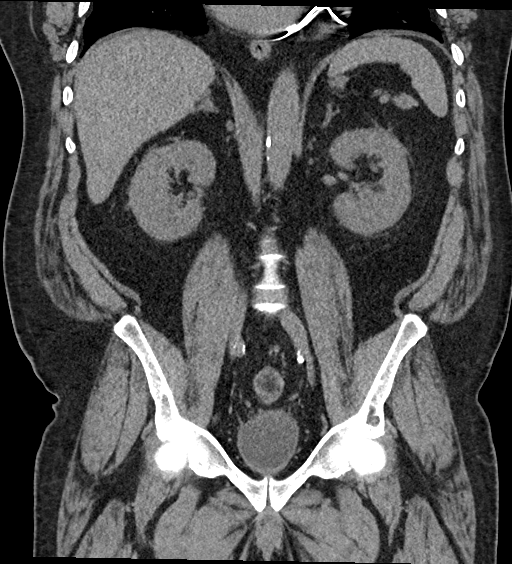

[17 of 46 positions shown; findings below may reference images not displayed]

FINDINGS: Lower chest: Patchy atelectasis at the lung bases.

Hepatobiliary: No focal liver abnormality is seen. No gallstones,
gallbladder wall thickening, or biliary dilatation.

Pancreas: Unremarkable.

Spleen: Unremarkable.

Adrenals/Urinary Tract: Adrenals, kidneys, bladder are unremarkable.

Stomach/Bowel: Stomach is within normal limits. Bowel is normal
caliber. Normal appendix.

Vascular/Lymphatic: Aortic atherosclerosis. There are no enlarged
lymph nodes identified

Reproductive: Prostate is unremarkable.

Other: Ascites.  Small fat containing left inguinal hernia.

Musculoskeletal: No acute abnormality.
IMPRESSION: No acute abnormality or findings to account for reported symptoms.

## 2021-04-23 ENCOUNTER — Encounter: Payer: Self-pay | Admitting: Pulmonary Disease

## 2021-04-23 ENCOUNTER — Other Ambulatory Visit (HOSPITAL_COMMUNITY): Payer: Self-pay | Admitting: Cardiology

## 2021-04-23 ENCOUNTER — Other Ambulatory Visit: Payer: Self-pay

## 2021-04-23 ENCOUNTER — Ambulatory Visit (INDEPENDENT_AMBULATORY_CARE_PROVIDER_SITE_OTHER): Payer: Medicaid Other | Admitting: Pulmonary Disease

## 2021-04-23 VITALS — BP 126/80 | HR 60 | Temp 97.6°F | Ht 68.5 in | Wt 270.0 lb

## 2021-04-23 DIAGNOSIS — G4733 Obstructive sleep apnea (adult) (pediatric): Secondary | ICD-10-CM | POA: Diagnosis not present

## 2021-04-23 DIAGNOSIS — I5022 Chronic systolic (congestive) heart failure: Secondary | ICD-10-CM | POA: Diagnosis not present

## 2021-04-23 NOTE — Assessment & Plan Note (Signed)
Reviewed last cardiology consultation.  He seems to be in optimal goal-directed therapy

## 2021-04-23 NOTE — Assessment & Plan Note (Signed)
I have reviewed his titration study and his prior download which showed significant treatment emergent central apneas.  He does not qualify for ASV device because he has a low EF.  He is not a good candidate for hypoglossal nerve stimulation due to his obesity. His main complaint seems to be bloating which is related to the high pressure requirements BiPAP.  I think he would do much better with auto BiPAP settings.  We will proceed with a repeat BiPAP titration study and see if his central apneas are better controlled.  Even if not I would suggest that we change him to auto BiPAP settings which would lower the average pressure and hopefully provide for better compliance and a better experience. Weight loss encouraged, compliance with goal of at least 4-6 hrs every night is the expectation. Advised against medications with sedative side effects Cautioned against driving when sleepy - understanding that sleepiness will vary on a day to day basis

## 2021-04-23 NOTE — Patient Instructions (Signed)
We will schedule BiPAP titration study and see if we can adjust the pressures lower on your machine We will try to get you a new hose and mask

## 2021-04-23 NOTE — Progress Notes (Signed)
Subjective:    Patient ID: Spencer Robertson, male    DOB: May 19, 1961, 60 y.o.   MRN: [id number]  HPI  60 yo with nonischemic cardiomyopathy presents to establish care for obstructive sleep apnea PMH CHF -EF 20% , s/p PPM , follows with CHF clinic Dr. Shirlee Latch He has been following up with Dr. Mayford Knife for his OSA.  Diagnosed with severe OSA as far back as 2005, confirmed on repeat studies.  He had treatment emergent centrals I reviewed last titration study 12/2017 which showed that central apneas persisted despite high BiPAP pressures with severe oxygen desaturations and a BiPAP pressure of 23/19 was recommended with a backup rate of 18.  He reports that with the high pressures his stomach was getting bloated and his nose was dry.  For some reason DME provided him with a regular hose instead of a heated hose and this did not help.  Find out on his last consultation with Dr. Mayford Knife 02/2021 that repeat BiPAP titration was advised. He has not used his BiPAP for more than a year  Epworth sleepiness score is 2 and he reports that he sleeps well during the night. Bedtime is around 10 PM, sleeps on his right side with 2 pillows, denies nocturnal awakenings and is out of bed by 7 AM feeling rested without dryness of mouth or headaches.  He has lost 10 pounds over the last 3 years. He continues to smoke about a pack per week There is no history suggestive of cataplexy, sleep paralysis or parasomnias   Significant tests/ events reviewed  severe OSA back in 2005 and was titrated to CPAP of 18 cm. HST 2011  severe OSA with AHI of 59/hour   12/2017 Titration  that shows significant central sleep apnea persisted despite high BiPAP pressures.  Moderate central sleep apnea at 28\hour.  And SaO2 low 78%.  He was changed to a ResMed BiPAP S/T 23/19 with a backup rate at 18.     Past Medical History:  Diagnosis Date  . CHF (congestive heart failure) (HCC)   . Hyperlipidemia   . Hypertension   . Mitral  regurgitation   . Nonischemic cardiomyopathy (HCC)   . Obesity   . Obesity (BMI 30-39.9) 09/01/2015  . OSA (obstructive sleep apnea)    severe  . S/P implantation of automatic cardioverter/defibrillator (AICD)    Guidant Contact H177  . Tobacco abuse     Past Surgical History:  Procedure Laterality Date  . BIV ICD GENERATOR CHANGEOUT N/A 10/06/2020   Procedure: BIV ICD GENERATOR CHANGEOUT;  Surgeon: Duke Salvia, MD;  Location: [facility] INVASIVE CV LAB;  Service: Cardiovascular;  Laterality: N/A;  . CARDIAC DEFIBRILLATOR PLACEMENT     Guidant Contak H177 device     Allergies  Allergen Reactions  . Spironolactone Other (See Comments)    gynecomastia    Social History   Socioeconomic History  . Marital status: Single    Spouse name: Not on file  . Number of children: Not on file  . Years of education: Not on file  . Highest education level: Not on file  Occupational History  . Occupation: disabled  Tobacco Use  . Smoking status: Current Some Day Smoker    Packs/day: 0.25    Years: 20.00    Pack years: 5.00    Types: Cigarettes  . Smokeless tobacco: Never Used  . Tobacco comment: smokes 3-5 per day 04/23/21  Vaping Use  . Vaping Use: Never used  Substance and Sexual  Activity  . Alcohol use: Yes    Comment: socially  . Drug use: Yes    Types: Marijuana    Comment: occasional  . Sexual activity: Not on file  Other Topics Concern  . Not on file  Social History Narrative  . Not on file   Social Determinants of Health   Financial Resource Strain: Not on file  Food Insecurity: Not on file  Transportation Needs: Not on file  Physical Activity: Not on file  Stress: Not on file  Social Connections: Not on file  Intimate Partner Violence: Not on file    Family History  Problem Relation Age of Onset  . Cardiomyopathy Father   . Hypertension Other   . Heart disease Sister   . Heart disease Brother      Review of Systems Constitutional: negative for anorexia,  fevers and sweats  Eyes: negative for irritation, redness and visual disturbance  Ears, nose, mouth, throat, and face: negative for earaches, epistaxis, nasal congestion and sore throat  Respiratory: negative for cough,  sputum and wheezing  Cardiovascular: negative for chest pain,orthopnea, palpitations and syncope  Gastrointestinal: negative for abdominal pain, constipation, diarrhea, melena, nausea and vomiting  Genitourinary:negative for dysuria, frequency and hematuria  Hematologic/lymphatic: negative for bleeding, easy bruising and lymphadenopathy  Musculoskeletal:negative for arthralgias, muscle weakness and stiff joints  Neurological: negative for coordination problems, gait problems, headaches and weakness  Endocrine: negative for diabetic symptoms including polydipsia, polyuria and weight loss     Objective:   Physical Exam  Gen. Pleasant, obese, in no distress, normal affect ENT - no pallor,icterus, no post nasal drip, class 2-3 airway Neck: No JVD, no thyromegaly, no carotid bruits Lungs: no use of accessory muscles, no dullness to percussion, decreased without rales or rhonchi  Cardiovascular: Rhythm regular, heart sounds  normal, no murmurs or gallops, no peripheral edema Abdomen: soft and non-tender, no hepatosplenomegaly, BS normal. Musculoskeletal: No deformities, no cyanosis or clubbing Neuro:  alert, non focal, no tremors       Assessment & Plan:

## 2021-04-29 NOTE — Progress Notes (Signed)
Remote ICD transmission.   

## 2021-05-18 ENCOUNTER — Other Ambulatory Visit (HOSPITAL_COMMUNITY): Payer: Self-pay | Admitting: Cardiology

## 2021-05-25 ENCOUNTER — Other Ambulatory Visit (HOSPITAL_COMMUNITY): Payer: Self-pay | Admitting: *Deleted

## 2021-05-25 MED ORDER — POTASSIUM CHLORIDE CRYS ER 20 MEQ PO TBCR: EXTENDED_RELEASE_TABLET | ORAL | 3 refills | Status: DC

## 2021-06-03 ENCOUNTER — Encounter (HOSPITAL_COMMUNITY): Payer: Medicaid Other

## 2021-06-04 ENCOUNTER — Encounter (HOSPITAL_COMMUNITY): Payer: Medicaid Other

## 2021-06-09 ENCOUNTER — Telehealth: Payer: Self-pay | Admitting: Emergency Medicine

## 2021-06-09 NOTE — Telephone Encounter (Signed)
Accidental open note.

## 2021-06-24 ENCOUNTER — Other Ambulatory Visit: Payer: Self-pay

## 2021-06-24 ENCOUNTER — Ambulatory Visit (HOSPITAL_BASED_OUTPATIENT_CLINIC_OR_DEPARTMENT_OTHER): Payer: Medicaid Other | Attending: Pulmonary Disease | Admitting: Pulmonary Disease

## 2021-06-24 DIAGNOSIS — G4733 Obstructive sleep apnea (adult) (pediatric): Secondary | ICD-10-CM | POA: Insufficient documentation

## 2021-06-28 NOTE — Progress Notes (Signed)
Cardiology: Dr. Graciela Husbands HF Cardiology: Dr. Shirlee Latch  60 y.o. with history of nonischemic cardiomyopathy with a long history of cardiomyopathy.  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  Echo in 7/18 showed EF 20-25% with diffuse hypokinesis and dilated RV.  He has a strong family history of CHF (father, sister, half-brother).  Sleep study in 12/18 with severe central sleep apnea, now on Bipap.  Echo in 8/19 showed EF 20-25%, moderate MR, mildly decreased RV systolic function.    Echo in 11/20 showed EF 25-30%, mildly decreased RV systolic function.  Echo was done today, EF 25-30%, diffuse hypokinesis, mildly decreased RV systolic function.   Today he returns for HF follow up. He was seen 3/22 for follow up and had stable NYHA I-II symptoms, not volume overloaded. He overall feels fine. Denies increasing SOB, CP, dizziness, edema, or PND/Orthopnea. Appetite ok. No fever or chills. Weight at home 268 pounds, does not weigh every day.Taking all medications. He is still smoking but trying to cut back. Using BiPap. Working PT custodian at [employer].  ECG (personally reviewed): SR with BiV pacing, paced QTc 499 msec  ICD interrogation (personally reviewed): HL Score 0, stable thoracic impedence, daily activity ~3 hrs, no AT/AF, >99% V -pacing  Labs (8/18): K 4.3, creatinine 0.88 Labs (08/17/2017) K 4.1 Creatinine 1.17, BNP 114 Labs (11/18): K 3.9, creatinine 1.14 Labs (4/19): K 4.5, creatinine 1.06 Labs (6/19): K 4, creatinine 1.09 Labs (9/19): K 3.9, creatinine 1.36 Labs (11/19): K 4.4, creatinine 1.27 Labs (3/20): K 4.5, creatinine 1.31 Labs (8/20): K 4.4, creatinine 1.34 Labs (12/20): K 4.8, creatinine 1.53 Labs (7/21): K 4, creatinine 1.46 Labs (9/21): LDL 56, TGs 337 Labs (10/21): K 3.9, creatinine 1.35 Labs (3/22): K 4.4, creatinine 1.41, A1c 6.4, HDL 26, LDL 30, TGs 91, Chol 74  PMH: 1. Central sleep apnea: Uses Bipap.  2. LBBB: s/p [employer] CRT-D.   3. He is on sotalol due to  high DFT.  4. History of atrial tachycardia 5. Active smoker 6. Chronic systolic CHF: nonischemic cardiomyopathy.  Known since prior to 2005.  [employer] CRT-D.  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  - Echo (7/18): EF 20-25%, severe dilated LV, diffuse hypokinesis, severe RV dilation with low normal systolic function, moderate TR, PASP 52 mmHg.  - Gynecomastia with spironolactone.  - Echo (8/19): EF 20-25%, moderate MR, mildly decreased RV systolic function.  - CPX (9/19): peak VO2 14.4, VE/VCO2 slope 40, RER 1.08.  Moderate functional limitation due to HF and body habitus.  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  - Echo (11/20): EF 25-30%, mildly decreased RV systolic function.  - Echo (3/22): EF 25-30%, diffuse hypokinesis, mildly decreased RV systolic function.   SH: Smokes 4 cigs/day, rare ETOH, occasional marijuana.   Lives with friend in [place].   FH: Father with cardiomyopathy, 1/2 brother died from CHF, sister with CHF.   ROS: All systems reviewed and negative except as per HPI.   Current Outpatient Medications  Medication Sig Dispense Refill   aspirin 81 MG tablet Take 81 mg by mouth daily.     atorvastatin (LIPITOR) 80 MG tablet Take 1 tablet (80 mg total) by mouth daily. 90 tablet 3   buPROPion (WELLBUTRIN SR) 150 MG 12 hr tablet Take 1 tablet (150 mg total) by mouth daily for 3 days, THEN 1 tablet (150 mg total) 2 (two) times daily. 60 tablet 3   carvedilol (COREG) 25 MG tablet TAKE 1 TABLET BY MOUTH TWICE DAILY  WITH A MEAL 180 tablet 2   cetirizine (ZYRTEC) 10 MG chewable tablet Chew 10 mg by mouth daily.     ENTRESTO 97-103 MG TAKE 1 TABLET BY MOUTH TWICE DAILY 180 tablet 3   eplerenone (INSPRA) 50 MG tablet TAKE 1 TABLET(50 MG) BY MOUTH DAILY 30 tablet 11   FARXIGA 10 MG TABS tablet TAKE 1 TABLET(10 MG) BY MOUTH DAILY BEFORE BREAKFAST 90 tablet 3   furosemide (LASIX) 40 MG tablet TAKE 1 TABLET BY MOUTH EVERY MORNING AND 1/2 TABLET EVERY  EVENING 90 tablet 3   isosorbide-hydrALAZINE (BIDIL) 20-37.5 MG tablet Take 1 tablet by mouth in the morning and at bedtime. 180 tablet 3   mometasone (NASONEX) 50 MCG/ACT nasal spray Place 2 sprays into the nose as needed.     potassium chloride SA (KLOR-CON) 20 MEQ tablet TAKE 1 TABLET(20 MEQ) BY MOUTH DAILY 90 tablet 3   sotalol (BETAPACE) 80 MG tablet TAKE 1 TABLET(80 MG) BY MOUTH TWICE DAILY 180 tablet 3   No current facility-administered medications for this encounter.   Wt Readings from Last 3 Encounters:  06/29/21 121.9 kg (268 lb 12.8 oz)  06/24/21 122.5 kg (270 lb)  04/23/21 122.5 kg (270 lb)   BP (!) 94/57   Pulse 77   Wt 121.9 kg (268 lb 12.8 oz)   SpO2 93%   BMI 37.49 kg/m   General:  NAD. No resp difficulty HEENT: Normal Neck: Supple. Thick neck. Carotids 2+ bilat; no bruits. No lymphadenopathy or thryomegaly appreciated. Cor: PMI nondisplaced. Regular rate & rhythm. No rubs, gallops or murmurs. Lungs: Clear Abdomen: Obese, nontender, nondistended. No hepatosplenomegaly. No bruits or masses. Good bowel sounds. Extremities: No cyanosis, clubbing, rash, edema Neuro: Alert & oriented x 3, cranial nerves grossly intact. Moves all 4 extremities w/o difficulty. Affect pleasant.  Assessment/Plan: 1. Chronic systolic CHF: Nonischemic cardiomyopathy x years.  [employer] CRT-D device.  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  Echo in 8/19 with EF 20-25%, echo in 11/20 showed EF 25-30%, echo (3/22) showed stable EF 25-30%.  CPX in 9/19 showed moderate functional limitation from heart failure.  He saw Dr. 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  He is not volume overloaded on exam, weight stable. NYHA class I-II symptoms.  - Continue Lasix 40 qam/20 qpm.  BMET today.  - Continue dapagliflozin 10 mg daily.  No GU symptoms. - Continue Entresto 97/103 mg bid.  - Continue Coreg 25 mg bid.   - Continue eplerenone 50 mg daily (Intolerant spiro due to gynecomastia).    - Continue Bidil 1 tab tid.  (Recently cut back due to soft BP. He is not dizzy or  lightheaded, will continue current blood pressure regimen for now. He will check BP at home, if SBP<90, consider cutting back eplerenone and increasing evening lasix ,or decreasing BiDil to 1/2 tab). - Taking sotalol b/c of high DFTs.  ECG showed paced QTc 499 msec (keep paced QTc < 550 msec).  2. Smoking: Discussed smoking cessation again.  Smoking ~7 cigarettes/day. - Continue bupropion.  3. Atrial Tachycardia: He is on sotalol.  4. Central sleep apnea: Continue Bipap.  - Recently changed to auto BiPap settings by Dr. Vassie Loll. - Repeat sleep study from last week pending.  Followup in 4 months with Dr. Shirlee Latch.   Anderson Malta [facility] FNP-BC 06/29/2021

## 2021-06-29 ENCOUNTER — Ambulatory Visit (HOSPITAL_COMMUNITY)
Admission: RE | Admit: 2021-06-29 | Discharge: 2021-06-29 | Disposition: A | Discharge status: Home / Self Care | Payer: Medicaid Other | Source: Ambulatory Visit | Attending: Family Medicine | Admitting: Family Medicine

## 2021-06-29 ENCOUNTER — Telehealth: Payer: Self-pay | Admitting: Pulmonary Disease

## 2021-06-29 ENCOUNTER — Encounter (HOSPITAL_COMMUNITY): Payer: Self-pay

## 2021-06-29 ENCOUNTER — Other Ambulatory Visit: Payer: Self-pay

## 2021-06-29 VITALS — BP 94/57 | HR 77 | Wt 268.8 lb

## 2021-06-29 DIAGNOSIS — Z7901 Long term (current) use of anticoagulants: Secondary | ICD-10-CM | POA: Diagnosis not present

## 2021-06-29 DIAGNOSIS — Z7984 Long term (current) use of oral hypoglycemic drugs: Secondary | ICD-10-CM | POA: Diagnosis not present

## 2021-06-29 DIAGNOSIS — I428 Other cardiomyopathies: Secondary | ICD-10-CM | POA: Diagnosis not present

## 2021-06-29 DIAGNOSIS — Z79899 Other long term (current) drug therapy: Secondary | ICD-10-CM | POA: Diagnosis not present

## 2021-06-29 DIAGNOSIS — F17210 Nicotine dependence, cigarettes, uncomplicated: Secondary | ICD-10-CM | POA: Insufficient documentation

## 2021-06-29 DIAGNOSIS — I5022 Chronic systolic (congestive) heart failure: Secondary | ICD-10-CM

## 2021-06-29 DIAGNOSIS — G4733 Obstructive sleep apnea (adult) (pediatric): Secondary | ICD-10-CM | POA: Diagnosis not present

## 2021-06-29 DIAGNOSIS — I4719 Other supraventricular tachycardia: Secondary | ICD-10-CM

## 2021-06-29 DIAGNOSIS — Z8249 Family history of ischemic heart disease and other diseases of the circulatory system: Secondary | ICD-10-CM | POA: Insufficient documentation

## 2021-06-29 DIAGNOSIS — Z7982 Long term (current) use of aspirin: Secondary | ICD-10-CM | POA: Diagnosis not present

## 2021-06-29 DIAGNOSIS — G4731 Primary central sleep apnea: Secondary | ICD-10-CM

## 2021-06-29 DIAGNOSIS — I471 Supraventricular tachycardia: Secondary | ICD-10-CM

## 2021-06-29 LAB — BASIC METABOLIC PANEL
Anion gap: 6 (ref 5–15)
BUN: 18 mg/dL (ref 6–20)
CO2: 26 mmol/L (ref 22–32)
Calcium: 8.9 mg/dL (ref 8.9–10.3)
Chloride: 104 mmol/L (ref 98–111)
Creatinine, Ser: 1.23 mg/dL (ref 0.61–1.24)
GFR, Estimated: >60 mL/min (ref >60)
Glucose, Bld: 98 mg/dL (ref 70–99)
Potassium: 4.1 mmol/L (ref 3.5–5.1)
Sodium: 136 mmol/L (ref 135–145)

## 2021-06-29 NOTE — Addendum Note (Signed)
Encounter addended by: Theresia Bough, CMA on: 06/29/2021 9:09 AM  Actions taken: Clinical Note Signed

## 2021-06-29 NOTE — Procedures (Signed)
Patient Name: Spencer Robertson, Spencer Robertson Date: 06/24/2021 Gender: Male D.O.B: [birth year] Age (years): 65 Referring Provider: Cyril Mourning MD, ABSM Height (inches): 71 Interpreting Physician: Cyril Mourning MD, ABSM Weight (lbs): 270 RPSGT: Shelah Lewandowsky BMI: 38 MRN: [id number] Neck Size: 19.50 <br> <br> CLINICAL INFORMATION The patient is referred for a BiPAP titration to treat sleep apnea.    Date of NPSG: 11/2004 showed AHI 49/h HST 2011  severe OSA with AHI of 59/hour    12/2017 Titration showed significant central sleep apnea persisted despite high BiPAP pressures.  Moderate central sleep apnea at 28\hour.  And SaO2 low 78%.  He was changed to a ResMed BiPAP S/T 23/19 with a backup rate at 18.  SLEEP STUDY TECHNIQUE As per the AASM Manual for the Scoring of Sleep and Associated Events v2.3 (April 2016) with a hypopnea requiring 4% desaturations.  The channels recorded and monitored were frontal, central and occipital EEG, electrooculogram (EOG), submentalis EMG (chin), nasal and oral airflow, thoracic and abdominal wall motion, anterior tibialis EMG, snore microphone, electrocardiogram, and pulse oximetry. Bilevel positive airway pressure (BPAP) was initiated at the beginning of the study and titrated to treat sleep-disordered breathing.  MEDICATIONS Medications self-administered by patient taken the night of the study : CARVEDILOL, SOTALOL  RESPIRATORY PARAMETERS Optimal IPAP Pressure (cm): 23 AHI at Optimal Pressure (/hr) 0 Optimal EPAP Pressure (cm): 19   Overall Minimal O2 (%): 81.0 Minimal O2 at Optimal Pressure (%): 92.0 SLEEP ARCHITECTURE Start Time: 10:27:06 PM Stop Time: 4:58:58 AM Total Time (min): 391.9 Total Sleep Time (min): 345.5 Sleep Latency (min): 10.0 Sleep Efficiency (%): 88.2% REM Latency (min): 61.0 WASO (min): 36.4 Stage N1 (%): 10.1% Stage N2 (%): 63.0% Stage N3 (%): 0.0% Stage R (%): 26.9 Supine (%): 2.32 Arousal Index (/hr): 23.3     CARDIAC DATA The 2  lead EKG demonstrated pacemaker generated. The mean heart rate was 60.4 beats per minute. Other EKG findings include: PVCs. LEG MOVEMENT DATA The total Periodic Limb Movements of Sleep (PLMS) were 0. The PLMS index was 0.0. A PLMS index of <15 is considered normal in adults.  IMPRESSIONS - An optimal BiPAP/ ST pressure was selected for this patient ( 23 / 19cm of water) with back up rate of 18 - Moderate Central Sleep Apnea was noted during this titration (CAI = 15.8/h). - Moderete oxygen desaturations were observed during this titration (min O2 = 81.0%). - No snoring was audible during this study. - 2-lead EKG demonstrated: PVCs - Clinically significant periodic limb movements were not noted during this study. Arousals associated with PLMs were rare.   DIAGNOSIS - Obstructive Sleep Apnea (G47.33)   RECOMMENDATIONS - Trial of BiPAP-ST therapy on 23/19 cm H2O ,back up rate of 18 with a Large size Fisher&Paykel Full Face Mask F&P Vitera (new) mask and heated humidification. - Avoid alcohol, sedatives and other CNS depressants that may worsen sleep apnea and disrupt normal sleep architecture. - Sleep hygiene should be reviewed to assess factors that may improve sleep quality. - Weight management and regular exercise should be initiated or continued. - Return to Sleep Center for re-evaluation after 4 weeks of therapy   Cyril Mourning MD Board Certified in Sleep medicine

## 2021-06-29 NOTE — Telephone Encounter (Signed)
Per Dr Vassie Loll:  needs FU appt after sleep study to review download on bipap. Please ask that he is back t o using his bipap please  I tried calling the pt and there was no answer and no option to leave msg. Will call back.

## 2021-06-29 NOTE — Patient Instructions (Signed)
It was great to see you today! No medication changes are needed at this time.  Labs today We will only contact you if something comes back abnormal or we need to make some changes. Otherwise no news is good news!  Your physician recommends that you schedule a follow-up appointment in: 4 months with Dr Shirlee Latch  Do the following things EVERYDAY: Weigh yourself in the morning before breakfast. Write it down and keep it in a log. Take your medicines as prescribed Eat low salt foods--Limit salt (sodium) to 2000 mg per day.  Stay as active as you can everyday Limit all fluids for the day to less than 2 liters  milAt the [facility], you and your health needs are our priority. As part of our continuing mission to provide you with exceptional heart care, we have created designated Provider Care Teams. These Care Teams include your primary Cardiologist (physician) and Advanced Practice Providers (APPs- Physician Assistants and Nurse Practitioners) who all work together to provide you with the care you need, when you need it.   You may see any of the following providers on your designated Care Team at your next follow up: Dr Arvilla Meres Dr Marca Ancona Dr Brandon Melnick, NP Robbie Lis, [place] Mikki Santee Karle Plumber, PharmD   Please be sure to bring in all your medications bottles to every appointment.

## 2021-06-30 NOTE — Telephone Encounter (Signed)
Tried calling the pt on both numbers provided and there was no answer and no option to leave msg this time. Will call back.

## 2021-07-09 NOTE — Telephone Encounter (Signed)
Pt returning a phone call. Pt can be reached at [phone]

## 2021-07-09 NOTE — Telephone Encounter (Signed)
ATC patient to get him scheduled for an appointment in [facility] with either Dr. Vassie Loll or APP, [facility]  When patient calls back please scheduled him for an appointment with Dr. Vassie Loll or APP in [place]

## 2021-07-10 ENCOUNTER — Encounter: Payer: Self-pay | Admitting: *Deleted

## 2021-07-10 NOTE — Telephone Encounter (Signed)
Tried calling pt again and still no answer  Letter mailed

## 2021-07-15 ENCOUNTER — Ambulatory Visit (INDEPENDENT_AMBULATORY_CARE_PROVIDER_SITE_OTHER): Payer: Medicaid Other

## 2021-07-15 DIAGNOSIS — I420 Dilated cardiomyopathy: Secondary | ICD-10-CM | POA: Diagnosis not present

## 2021-07-16 ENCOUNTER — Telehealth: Payer: Self-pay | Admitting: Pulmonary Disease

## 2021-07-16 LAB — CUP PACEART REMOTE DEVICE CHECK
Battery Remaining Longevity: 132 mo
Battery Remaining Percentage: 100 %
Brady Statistic RA Percent Paced: 34 %
Brady Statistic RV Percent Paced: 99 %
Date Time Interrogation Session: 20220810100700
HighPow Impedance: 49 Ohm
Implantable Lead Implant Date: 20051122
Implantable Lead Implant Date: 20051122
Implantable Lead Implant Date: 20051122
Implantable Lead Location: 753858
Implantable Lead Location: 753859
Implantable Lead Location: 753860
Implantable Lead Model: 158
Implantable Lead Model: 4194
Implantable Lead Model: 5076
Implantable Lead Serial Number: 159458
Implantable Pulse Generator Implant Date: 20211101
Lead Channel Impedance Value: 479 Ohm
Lead Channel Impedance Value: 559 Ohm
Lead Channel Impedance Value: 604 Ohm
Lead Channel Setting Pacing Amplitude: 2 V
Lead Channel Setting Pacing Amplitude: 2 V
Lead Channel Setting Pacing Amplitude: 2.4 V
Lead Channel Setting Pacing Pulse Width: 0.4 ms
Lead Channel Setting Pacing Pulse Width: 0.4 ms
Lead Channel Setting Sensing Sensitivity: 0.5 mV
Lead Channel Setting Sensing Sensitivity: 1 mV
Pulse Gen Serial Number: 147594

## 2021-07-16 NOTE — Telephone Encounter (Signed)
Call made to patient, confirmed DOB. Appt already made for tomorrow. Aware results will be discussed with him then. Voiced understanding.   Nothing further needed at this time.

## 2021-07-17 ENCOUNTER — Encounter: Payer: Self-pay | Admitting: Primary Care

## 2021-07-17 ENCOUNTER — Other Ambulatory Visit: Payer: Self-pay

## 2021-07-17 ENCOUNTER — Ambulatory Visit (INDEPENDENT_AMBULATORY_CARE_PROVIDER_SITE_OTHER): Payer: Medicaid Other | Admitting: Primary Care

## 2021-07-17 VITALS — BP 110/60 | HR 71 | Temp 98.2°F | Ht 71.0 in | Wt 268.8 lb

## 2021-07-17 DIAGNOSIS — G4733 Obstructive sleep apnea (adult) (pediatric): Secondary | ICD-10-CM | POA: Diagnosis not present

## 2021-07-17 NOTE — Progress Notes (Signed)
Me   @Patient  ID: , male    DOB: 07-04-1961, 60 y.o.   MRN: [id number]  No chief complaint on file.   Referring provider: [id number], NP  HPI: 60 year old male, current some day smoker.  History significant for hypertension, dilated cardiomyopathy, ICD placement, systolic heart failure, OSA, tobacco abuse, obesity.  NPSG 11/2004 showed AHI 49/HR. Home sleep study 2011 showed severe obstructive sleep apnea with AHI 59/HR. Patient had titration study in 2019 which showed significant central sleep apnea persistent despite high BiPAP pressures.  He was changed to ResMed BiPAP ST 23/19 with backup rate 18.  07/17/2021 Patient presents today to review sleep study results. Patient underwent BIPAP titration study on 06/24/21 that showed he has moderate central sleep apnea (CAI-15.8/hr) with moderate oxygen desaturation (min o2 81%). Optimal BiPAP/ST pressure selected for this patient was 23/19 centimeters H2O with backup rate of 18. He reports that his old BIPAP machine was causing him to have nausea. He last wore BIPAP 1 year ago, his machine is >91 year old.  He slept well the night of his titration study and had no issue with mask fit/pressure setting or nausea the next moring. He denies significant daytimes sleepiness.  Allergies  Allergen Reactions   Spironolactone Other (See Comments)    gynecomastia    Immunization History  Administered Date(s) Administered   Influenza Inj Mdck Quad With Preservative 09/19/2018   Influenza,inj,Quad PF,6+ Mos 08/23/2019, 08/29/2020   Influenza-Unspecified 08/25/2015, 10/20/2016, 09/14/2017   PFIZER(Purple Top)SARS-COV-2 Vaccination 03/02/2020, 03/22/2020   Tdap 08/25/2020    Past Medical History:  Diagnosis Date   CHF (congestive heart failure) (HCC)    Hyperlipidemia    Hypertension    Mitral regurgitation    Nonischemic cardiomyopathy (HCC)    Obesity    Obesity (BMI 30-39.9) 09/01/2015   OSA (obstructive sleep apnea)     severe   S/P implantation of automatic cardioverter/defibrillator (AICD)    Guidant Contact [id number]   Tobacco abuse     Tobacco History: Social History   Tobacco Use  Smoking Status Some Days   Packs/day: 0.25   Years: 20.00   Pack years: 5.00   Types: Cigarettes  Smokeless Tobacco Never  Tobacco Comments   2 packs per week 07/17/2021   Ready to quit: Not Answered Counseling given: Not Answered Tobacco comments: 2 packs per week 07/17/2021   Outpatient Medications Prior to Visit  Medication Sig Dispense Refill   aspirin 81 MG tablet Take 81 mg by mouth daily.     buPROPion (WELLBUTRIN SR) 150 MG 12 hr tablet Take 1 tablet (150 mg total) by mouth daily for 3 days, THEN 1 tablet (150 mg total) 2 (two) times daily. 60 tablet 3   carvedilol (COREG) 25 MG tablet TAKE 1 TABLET BY MOUTH TWICE DAILY WITH A MEAL 180 tablet 2   cetirizine (ZYRTEC) 10 MG chewable tablet Chew 10 mg by mouth daily.     ENTRESTO 97-103 MG TAKE 1 TABLET BY MOUTH TWICE DAILY 180 tablet 3   eplerenone (INSPRA) 50 MG tablet TAKE 1 TABLET(50 MG) BY MOUTH DAILY 30 tablet 11   FARXIGA 10 MG TABS tablet TAKE 1 TABLET(10 MG) BY MOUTH DAILY BEFORE BREAKFAST 90 tablet 3   furosemide (LASIX) 40 MG tablet TAKE 1 TABLET BY MOUTH EVERY MORNING AND 1/2 TABLET EVERY EVENING 90 tablet 3   isosorbide-hydrALAZINE (BIDIL) 20-37.5 MG tablet Take 1 tablet by mouth in the morning and at bedtime. 180 tablet 3  mometasone (NASONEX) 50 MCG/ACT nasal spray Place 2 sprays into the nose as needed.     potassium chloride SA (KLOR-CON) 20 MEQ tablet TAKE 1 TABLET(20 MEQ) BY MOUTH DAILY 90 tablet 3   sotalol (BETAPACE) 80 MG tablet TAKE 1 TABLET(80 MG) BY MOUTH TWICE DAILY 180 tablet 3   atorvastatin (LIPITOR) 80 MG tablet Take 1 tablet (80 mg total) by mouth daily. 90 tablet 3   No facility-administered medications prior to visit.      Review of Systems  Review of Systems  Constitutional: Negative.  Negative for fatigue.  HENT:  Negative.    Respiratory: Negative.      Physical Exam  BP 110/60 (BP Location: Left Arm, Patient Position: Sitting, Cuff Size: Normal)   Pulse 71   Temp 98.2 F (36.8 C) (Oral)   Ht 5\' 11"  (1.803 m)   Wt 268 lb 12.8 oz (121.9 kg)   SpO2 90%   BMI 37.49 kg/m  Physical Exam Constitutional:      Appearance: Normal appearance.  HENT:     Head: Normocephalic and atraumatic.  Cardiovascular:     Rate and Rhythm: Normal rate and regular rhythm.  Pulmonary:     Effort: Pulmonary effort is normal.     Breath sounds: Normal breath sounds.  Skin:    General: Skin is warm and dry.  Neurological:     General: No focal deficit present.     Mental Status: He is alert and oriented to person, place, and time. Mental status is at baseline.  Psychiatric:        Mood and Affect: Mood normal.        Behavior: Behavior normal.        Thought Content: Thought content normal.        Judgment: Judgment normal.     Lab Results:  CBC    Component Value Date/Time   WBC 4.8 10/02/2020 1233   WBC 5.0 06/23/2020 2005   RBC 5.53 10/02/2020 1233   RBC 4.89 06/23/2020 2005   HGB 16.8 10/02/2020 1233   HCT 50.8 10/02/2020 1233   PLT 118 (L) 10/02/2020 1233   MCV 92 10/02/2020 1233   MCH 30.4 10/02/2020 1233   MCH 30.7 06/23/2020 2005   MCHC 33.1 10/02/2020 1233   MCHC 32.8 06/23/2020 2005   RDW 14.7 10/02/2020 1233   LYMPHSABS 1.8 10/02/2020 1233   MONOABS 0.5 09/12/2007 0625   EOSABS 0.2 10/02/2020 1233   BASOSABS 0.1 10/02/2020 1233    BMET    Component Value Date/Time   NA 136 06/29/2021 0900   NA 135 03/21/2018 0840   K 4.1 06/29/2021 0900   CL 104 06/29/2021 0900   CO2 26 06/29/2021 0900   GLUCOSE 98 06/29/2021 0900   BUN 18 06/29/2021 0900   BUN 11 03/21/2018 0840   CREATININE 1.23 06/29/2021 0900   CREATININE 1.02 08/18/2016 0811   CALCIUM 8.9 06/29/2021 0900   GFRNONAA >60 06/29/2021 0900   GFRAA >60 06/23/2020 2005    BNP    Component Value Date/Time   BNP  113.7 (H) 08/17/2017 1537    ProBNP    Component Value Date/Time   PROBNP 412.6 (H) 04/28/2010 04/30/2010    Imaging: No results found.   Assessment & Plan:   OSA (obstructive sleep apnea) - HST in 2011 showed severe OSA, AHI 59/hr. Patient underwent BIPAP titration study on 06/24/21 that showed he has moderate central sleep apnea (CAI-15.8/hr) with moderate oxygen desaturation (min o2 81%). -  Optimal BiPAP/ST pressure 23/19 cm H2O with backup rate of 18 - Advised patient to resume BIPAP once he gets a new machine. He should not drive if experiencing excessive daytime fatigue or somnolence. Avoid excessive alcohol prior to bedtime as this can worsen underlying sleep apnea. Do not take sedating medication without discussing with your provider. Work on weight loss efforts. Focus on side sleeping position. Follow-up in 3-4 months with Dr. Vassie Loll   Orders: - Needs new BIPAP-ST machine- pressure 23/19 cm H2O , back up rate of 18. Large size Fisher&Paykel Full Face Mask F&P Vitera (new) mask and heated humidification.   Glenford Bayley, NP 09/04/2021

## 2021-07-17 NOTE — Patient Instructions (Addendum)
Recommendations: - Once you get new BIPAP machine please start wearing mask every night for 6 hours or longer. Do not drive if experiencing excessive daytime fatigue or somnolence. Avoid excessive alcohol prior to bedtime as this can worsen underlying sleep apnea. Do not take sedating medication without discussing with your provider. Work on weight loss efforts. Focus on side sleeping position.   Orders: - Needs new BIPAP-ST machine- pressure 23/19 cm H2O , back up rate of 18. Large size Fisher&Paykel Full Face Mask F&P Vitera (new) mask and heated humidification.  Follow-up: - 3-4 months with Dr. Vassie Loll

## 2021-08-07 NOTE — Progress Notes (Signed)
Remote ICD transmission.   

## 2021-08-21 ENCOUNTER — Other Ambulatory Visit (INDEPENDENT_AMBULATORY_CARE_PROVIDER_SITE_OTHER): Payer: Self-pay | Admitting: Primary Care

## 2021-08-21 DIAGNOSIS — E785 Hyperlipidemia, unspecified: Secondary | ICD-10-CM

## 2021-08-21 NOTE — Telephone Encounter (Signed)
Sent to PCP ?

## 2021-08-24 ENCOUNTER — Other Ambulatory Visit (INDEPENDENT_AMBULATORY_CARE_PROVIDER_SITE_OTHER): Payer: Self-pay | Admitting: Primary Care

## 2021-08-24 MED ORDER — ATORVASTATIN CALCIUM 40 MG PO TABS: 40.0000 mg | ORAL_TABLET | Freq: Every day | ORAL | 3 refills | Status: DC

## 2021-08-28 ENCOUNTER — Ambulatory Visit (INDEPENDENT_AMBULATORY_CARE_PROVIDER_SITE_OTHER): Payer: Medicaid Other | Admitting: Primary Care

## 2021-08-28 ENCOUNTER — Encounter (INDEPENDENT_AMBULATORY_CARE_PROVIDER_SITE_OTHER): Payer: Self-pay | Admitting: Primary Care

## 2021-08-28 ENCOUNTER — Other Ambulatory Visit: Payer: Self-pay

## 2021-08-28 VITALS — BP 83/56 | HR 64 | Temp 97.3°F | Ht 71.0 in | Wt 266.0 lb

## 2021-08-28 DIAGNOSIS — R7303 Prediabetes: Secondary | ICD-10-CM | POA: Diagnosis not present

## 2021-08-28 DIAGNOSIS — E669 Obesity, unspecified: Secondary | ICD-10-CM | POA: Diagnosis not present

## 2021-08-28 DIAGNOSIS — Z1211 Encounter for screening for malignant neoplasm of colon: Secondary | ICD-10-CM

## 2021-08-28 LAB — POCT GLYCOSYLATED HEMOGLOBIN (HGB A1C): Hemoglobin A1C: 5.9 % — AB (ref 4.0–5.6)

## 2021-08-28 NOTE — Patient Instructions (Signed)

## 2021-08-30 NOTE — Progress Notes (Signed)
Established Patient Office Visit  Subjective:  Patient ID: Spencer Robertson, male    DOB: 1961-02-19  Age: 60 y.o. MRN: [id number]  CC:  Chief Complaint  Patient presents with   Prediabetes    HPI Spencer Robertson is a 60 year old obese male who presents for prediabetes checkup.  Blood pressure is 83/56 he is managed by cardiology.  Will make them aware of today's readings. Denies shortness of breath, headaches, chest pain or lower extremity edema   Past Medical History:  Diagnosis Date   CHF (congestive heart failure) (HCC)    Hyperlipidemia    Hypertension    Mitral regurgitation    Nonischemic cardiomyopathy (HCC)    Obesity    Obesity (BMI 30-39.9) 09/01/2015   OSA (obstructive sleep apnea)    severe   S/P implantation of automatic cardioverter/defibrillator (AICD)    Guidant Contact H177   Tobacco abuse     Past Surgical History:  Procedure Laterality Date   BIV ICD GENERATOR CHANGEOUT N/A 10/06/2020   Procedure: BIV ICD GENERATOR CHANGEOUT;  Surgeon: Duke Salvia, MD;  Location: [facility] INVASIVE CV LAB;  Service: Cardiovascular;  Laterality: N/A;   CARDIAC DEFIBRILLATOR PLACEMENT     Guidant Contak H177 device     Family History  Problem Relation Age of Onset   Cardiomyopathy Father    Hypertension Other    Heart disease Sister    Heart disease Brother     Social History   Socioeconomic History   Marital status: Single    Spouse name: Not on file   Number of children: Not on file   Years of education: Not on file   Highest education level: Not on file  Occupational History   Occupation: disabled  Tobacco Use   Smoking status: Some Days    Packs/day: 0.25    Years: 20.00    Pack years: 5.00    Types: Cigarettes   Smokeless tobacco: Never   Tobacco comments:    2 packs per week 07/17/2021  Vaping Use   Vaping Use: Never used  Substance and Sexual Activity   Alcohol use: Yes    Comment: socially   Drug use: Yes    Types: Marijuana    Comment:  occasional   Sexual activity: Not on file  Other Topics Concern   Not on file  Social History Narrative   Not on file   Social Determinants of Health   Financial Resource Strain: Not on file  Food Insecurity: Not on file  Transportation Needs: Not on file  Physical Activity: Not on file  Stress: Not on file  Social Connections: Not on file  Intimate Partner Violence: Not on file    Outpatient Medications Prior to Visit  Medication Sig Dispense Refill   aspirin 81 MG tablet Take 81 mg by mouth daily.     atorvastatin (LIPITOR) 40 MG tablet Take 1 tablet (40 mg total) by mouth daily. 90 tablet 3   buPROPion (WELLBUTRIN SR) 150 MG 12 hr tablet Take 1 tablet (150 mg total) by mouth daily for 3 days, THEN 1 tablet (150 mg total) 2 (two) times daily. 60 tablet 3   carvedilol (COREG) 25 MG tablet TAKE 1 TABLET BY MOUTH TWICE DAILY WITH A MEAL 180 tablet 2   cetirizine (ZYRTEC) 10 MG chewable tablet Chew 10 mg by mouth daily.     ENTRESTO 97-103 MG TAKE 1 TABLET BY MOUTH TWICE DAILY 180 tablet 3   eplerenone (INSPRA) 50 MG  tablet TAKE 1 TABLET(50 MG) BY MOUTH DAILY 30 tablet 11   FARXIGA 10 MG TABS tablet TAKE 1 TABLET(10 MG) BY MOUTH DAILY BEFORE BREAKFAST 90 tablet 3   furosemide (LASIX) 40 MG tablet TAKE 1 TABLET BY MOUTH EVERY MORNING AND 1/2 TABLET EVERY EVENING 90 tablet 3   isosorbide-hydrALAZINE (BIDIL) 20-37.5 MG tablet Take 1 tablet by mouth in the morning and at bedtime. 180 tablet 3   mometasone (NASONEX) 50 MCG/ACT nasal spray Place 2 sprays into the nose as needed.     potassium chloride SA (KLOR-CON) 20 MEQ tablet TAKE 1 TABLET(20 MEQ) BY MOUTH DAILY 90 tablet 3   sotalol (BETAPACE) 80 MG tablet TAKE 1 TABLET(80 MG) BY MOUTH TWICE DAILY 180 tablet 3   No facility-administered medications prior to visit.    Allergies  Allergen Reactions   Spironolactone Other (See Comments)    gynecomastia    ROS Review of Systems  All other systems reviewed and are negative.     Objective:    Physical Exam Vitals reviewed.  Constitutional:      Appearance: He is obese.  HENT:     Head: Normocephalic.     Right Ear: Tympanic membrane and external ear normal.     Left Ear: Tympanic membrane normal. There is impacted cerumen.     Nose: Nose normal.  Eyes:     Extraocular Movements: Extraocular movements intact.  Cardiovascular:     Rate and Rhythm: Normal rate and regular rhythm.  Pulmonary:     Effort: Pulmonary effort is normal.     Breath sounds: Normal breath sounds.  Abdominal:     General: Bowel sounds are normal. There is distension.     Palpations: Abdomen is soft.  Musculoskeletal:        General: Normal range of motion.     Cervical back: Normal range of motion and neck supple.  Skin:    General: Skin is warm and dry.  Neurological:     Mental Status: He is alert and oriented to person, place, and time.  Psychiatric:        Mood and Affect: Mood normal.        Behavior: Behavior normal.        Thought Content: Thought content normal.        Judgment: Judgment normal.   BP (!) 83/56 (BP Location: Right Arm, Patient Position: Sitting, Cuff Size: Large)   Pulse 64   Temp (!) 97.3 F (36.3 C) (Temporal)   Ht 5\' 11"  (1.803 m)   Wt 266 lb (120.7 kg)   SpO2 91%   BMI 37.10 kg/m  Wt Readings from Last 3 Encounters:  08/28/21 266 lb (120.7 kg)  07/17/21 268 lb 12.8 oz (121.9 kg)  06/29/21 268 lb 12.8 oz (121.9 kg)     Health Maintenance Due  Topic Date Due   COLONOSCOPY (Pts 45-46yrs Insurance coverage will need to be confirmed)  Never done   Zoster Vaccines- Shingrix (1 of 2) Never done   COVID-19 Vaccine (3 - Booster for Pfizer series) 08/22/2020    There are no preventive care reminders to display for this patient.  No results found for: TSH Lab Results  Component Value Date   WBC 4.8 10/02/2020   HGB 16.8 10/02/2020   HCT 50.8 10/02/2020   MCV 92 10/02/2020   PLT 118 (L) 10/02/2020   Lab Results  Component Value Date    NA 136 06/29/2021   K 4.1 06/29/2021  CO2 26 06/29/2021   GLUCOSE 98 06/29/2021   BUN 18 06/29/2021   CREATININE 1.23 06/29/2021   BILITOT 0.6 06/23/2020   ALKPHOS 47 06/23/2020   AST 17 06/23/2020   ALT 13 06/23/2020   PROT 7.0 06/23/2020   ALBUMIN 3.3 (L) 06/23/2020   CALCIUM 8.9 06/29/2021   ANIONGAP 6 06/29/2021   GFR 97.03 07/24/2015   Lab Results  Component Value Date   CHOL 74 (L) 02/24/2021   Lab Results  Component Value Date   HDL 26 (L) 02/24/2021   Lab Results  Component Value Date   LDLCALC 30 02/24/2021   Lab Results  Component Value Date   TRIG 91 02/24/2021   Lab Results  Component Value Date   CHOLHDL 2.8 02/24/2021   Lab Results  Component Value Date   HGBA1C 5.9 (A) 08/28/2021      Assessment & Plan:  Spencer Robertson was seen today for prediabetes.  Diagnoses and all orders for this visit:  Prediabetes -     HgB A1c 5.9   Colon cancer screening -     Ambulatory referral to Gastroenterology   Obesity (BMI 30-39.9)  Obesity is 30-39 indicating an excess in caloric intake or underlining conditions. This may lead to other co-morbidities. Lifestyle modifications of diet and exercise may reduce obesity.   Agreed on healthy weight loss of 12 pounds by follow-up visit.  Follow-up: Return in about 6 months (around 02/25/2022) for Prediabetes and weight.    Grayce Sessions, NP

## 2021-09-04 NOTE — Assessment & Plan Note (Signed)
-   HST in 2011 showed severe OSA, AHI 59/hr. Patient underwent BIPAP titration study on 06/24/21 that showed he has moderate central sleep apnea (CAI-15.8/hr) with moderate oxygen desaturation (min o2 81%). - Optimal BiPAP/ST pressure 23/19 cm H2O with backup rate of 18 - Advised patient to resume BIPAP once he gets a new machine. He should not drive if experiencing excessive daytime fatigue or somnolence. Avoid excessive alcohol prior to bedtime as this can worsen underlying sleep apnea. Do not take sedating medication without discussing with your provider. Work on weight loss efforts. Focus on side sleeping position. Follow-up in 3-4 months with Dr. Vassie Loll   Orders: - Needs new BIPAP-ST machine- pressure 23/19 cm H2O , back up rate of 18. Large size Fisher&Paykel Full Face Mask F&P Vitera (new) mask and heated humidification.

## 2021-09-11 ENCOUNTER — Telehealth (HOSPITAL_COMMUNITY): Payer: Self-pay | Admitting: *Deleted

## 2021-09-11 ENCOUNTER — Telehealth: Payer: Self-pay

## 2021-09-11 NOTE — Telephone Encounter (Signed)
The patient called to see when his next doctors appointment was. I let him know that he no showed his appointment with Graciela Husbands on 02/03/2021. He asked to reschedule the appointment because the pharmacy been sending messages to [place] about medications but Graciela Husbands has not responded. I let him speak with [place] to get on the schedule with Graciela Husbands.

## 2021-09-11 NOTE — Telephone Encounter (Signed)
Pt left vm requesting refills but did not say which medications he needed refills on. I called pt back no answer/no vm.

## 2021-09-15 ENCOUNTER — Other Ambulatory Visit (HOSPITAL_COMMUNITY): Payer: Self-pay

## 2021-09-15 MED ORDER — EPLERENONE 50 MG PO TABS
ORAL_TABLET | ORAL | 5 refills | Status: DC
Start: 2021-09-15 — End: 2021-09-16

## 2021-09-16 ENCOUNTER — Ambulatory Visit (INDEPENDENT_AMBULATORY_CARE_PROVIDER_SITE_OTHER): Payer: Medicaid Other | Admitting: Student

## 2021-09-16 ENCOUNTER — Other Ambulatory Visit: Payer: Self-pay

## 2021-09-16 ENCOUNTER — Encounter: Payer: Self-pay | Admitting: Student

## 2021-09-16 VITALS — BP 90/58 | HR 66 | Ht 70.0 in | Wt 269.6 lb

## 2021-09-16 DIAGNOSIS — G4733 Obstructive sleep apnea (adult) (pediatric): Secondary | ICD-10-CM

## 2021-09-16 DIAGNOSIS — Z95810 Presence of automatic (implantable) cardiac defibrillator: Secondary | ICD-10-CM

## 2021-09-16 DIAGNOSIS — I420 Dilated cardiomyopathy: Secondary | ICD-10-CM | POA: Diagnosis not present

## 2021-09-16 DIAGNOSIS — I5022 Chronic systolic (congestive) heart failure: Secondary | ICD-10-CM

## 2021-09-16 LAB — CUP PACEART INCLINIC DEVICE CHECK
Date Time Interrogation Session: 20221012101258
HighPow Impedance: 48 Ohm
Implantable Lead Implant Date: 20051122
Implantable Lead Implant Date: 20051122
Implantable Lead Implant Date: 20051122
Implantable Lead Location: 753858
Implantable Lead Location: 753859
Implantable Lead Location: 753860
Implantable Lead Model: 158
Implantable Lead Model: 4194
Implantable Lead Model: 5076
Implantable Lead Serial Number: 159458
Implantable Pulse Generator Implant Date: 20211101
Lead Channel Impedance Value: 450 Ohm
Lead Channel Impedance Value: 508 Ohm
Lead Channel Impedance Value: 576 Ohm
Lead Channel Pacing Threshold Amplitude: 1 V
Lead Channel Pacing Threshold Amplitude: 1.3 V
Lead Channel Pacing Threshold Amplitude: 2.3 V
Lead Channel Pacing Threshold Pulse Width: 0.4 ms
Lead Channel Pacing Threshold Pulse Width: 0.4 ms
Lead Channel Pacing Threshold Pulse Width: 1 ms
Lead Channel Sensing Intrinsic Amplitude: 14.3 mV
Lead Channel Sensing Intrinsic Amplitude: 3.4 mV
Lead Channel Sensing Intrinsic Amplitude: 7.8 mV
Lead Channel Setting Pacing Amplitude: 2 V
Lead Channel Setting Pacing Amplitude: 2.4 V
Lead Channel Setting Pacing Amplitude: 2.8 V
Lead Channel Setting Pacing Pulse Width: 0.4 ms
Lead Channel Setting Pacing Pulse Width: 1 ms
Lead Channel Setting Sensing Sensitivity: 0.5 mV
Lead Channel Setting Sensing Sensitivity: 1 mV
Pulse Gen Serial Number: 147594

## 2021-09-16 MED ORDER — EPLERENONE 50 MG PO TABS
ORAL_TABLET | ORAL | 5 refills | Status: DC
Start: 2021-09-16 — End: 2022-03-22

## 2021-09-16 NOTE — Progress Notes (Signed)
Electrophysiology Office Note Date: 09/16/2021  ID:  Spencer Robertson, Spencer Robertson 06/23/1961, MRN [id number]  PCP: Grayce Sessions, NP Primary Cardiologist: None Electrophysiologist: Sherryl Manges, MD   CC: Routine ICD follow-up  Spencer Robertson is a 60 y.o. male seen today for Sherryl Manges, MD for routine electrophysiology followup.  Since last being seen in our clinic the patient reports doing well overall.  he denies chest pain, palpitations, dyspnea, PND, orthopnea, nausea, vomiting, dizziness, syncope, edema, weight gain, or early satiety. He has not had ICD shocks.   Device History: BSCi CRT-D, gen change 08/05/10 and 09/2020, originally implanted 2005, Dr. Graciela Husbands AAD: sotalol added to improve DFT's  Past Medical History:  Diagnosis Date   CHF (congestive heart failure) (HCC)    Hyperlipidemia    Hypertension    Mitral regurgitation    Nonischemic cardiomyopathy (HCC)    Obesity    Obesity (BMI 30-39.9) 09/01/2015   OSA (obstructive sleep apnea)    severe   S/P implantation of automatic cardioverter/defibrillator (AICD)    Guidant Contact [id number]   Tobacco abuse    Past Surgical History:  Procedure Laterality Date   BIV ICD GENERATOR CHANGEOUT N/A 10/06/2020   Procedure: BIV ICD GENERATOR CHANGEOUT;  Surgeon: Duke Salvia, MD;  Location: [facility] INVASIVE CV LAB;  Service: Cardiovascular;  Laterality: N/A;   CARDIAC DEFIBRILLATOR PLACEMENT     Guidant Contak H177 device     Current Outpatient Medications  Medication Sig Dispense Refill   aspirin 81 MG tablet Take 81 mg by mouth daily.     atorvastatin (LIPITOR) 40 MG tablet Take 1 tablet (40 mg total) by mouth daily. 90 tablet 3   buPROPion (WELLBUTRIN SR) 150 MG 12 hr tablet Take 1 tablet (150 mg total) by mouth daily for 3 days, THEN 1 tablet (150 mg total) 2 (two) times daily. 60 tablet 3   carvedilol (COREG) 25 MG tablet TAKE 1 TABLET BY MOUTH TWICE DAILY WITH A MEAL 180 tablet 2   cetirizine (ZYRTEC) 10 MG chewable tablet  Chew 10 mg by mouth daily.     ENTRESTO 97-103 MG TAKE 1 TABLET BY MOUTH TWICE DAILY 180 tablet 3   eplerenone (INSPRA) 50 MG tablet TAKE 1 TABLET(50 MG) BY MOUTH DAILY 30 tablet 5   FARXIGA 10 MG TABS tablet TAKE 1 TABLET(10 MG) BY MOUTH DAILY BEFORE BREAKFAST 90 tablet 3   furosemide (LASIX) 40 MG tablet TAKE 1 TABLET BY MOUTH EVERY MORNING AND 1/2 TABLET EVERY EVENING 90 tablet 3   isosorbide-hydrALAZINE (BIDIL) 20-37.5 MG tablet Take 1 tablet by mouth in the morning and at bedtime. 180 tablet 3   mometasone (NASONEX) 50 MCG/ACT nasal spray Place 2 sprays into the nose as needed.     potassium chloride SA (KLOR-CON) 20 MEQ tablet TAKE 1 TABLET(20 MEQ) BY MOUTH DAILY 90 tablet 3   sotalol (BETAPACE) 80 MG tablet TAKE 1 TABLET(80 MG) BY MOUTH TWICE DAILY 180 tablet 3   No current facility-administered medications for this visit.    Allergies:   Spironolactone   Social History: Social History   Socioeconomic History   Marital status: Single    Spouse name: Not on file   Number of children: Not on file   Years of education: Not on file   Highest education level: Not on file  Occupational History   Occupation: disabled  Tobacco Use   Smoking status: Some Days    Packs/day: 0.25    Years: 20.00  Pack years: 5.00    Types: Cigarettes   Smokeless tobacco: Never   Tobacco comments:    2 packs per week 07/17/2021  Vaping Use   Vaping Use: Never used  Substance and Sexual Activity   Alcohol use: Yes    Comment: socially   Drug use: Yes    Types: Marijuana    Comment: occasional   Sexual activity: Not on file  Other Topics Concern   Not on file  Social History Narrative   Not on file   Social Determinants of Health   Financial Resource Strain: Not on file  Food Insecurity: Not on file  Transportation Needs: Not on file  Physical Activity: Not on file  Stress: Not on file  Social Connections: Not on file  Intimate Partner Violence: Not on file    Family  History: Family History  Problem Relation Age of Onset   Cardiomyopathy Father    Hypertension Other    Heart disease Sister    Heart disease Brother     Review of Systems: All other systems reviewed and are otherwise negative except as noted above.   Physical Exam: Vitals:   09/16/21 0950  BP: (!) 90/58  Pulse: 66  SpO2: 92%  Weight: 269 lb 9.6 oz (122.3 kg)  Height: 5\' 10"  (1.778 m)     GEN- The patient is well appearing, alert and oriented x 3 today.   HEENT: normocephalic, atraumatic; sclera clear, conjunctiva pink; hearing intact; oropharynx clear; neck supple, no JVP Lymph- no cervical lymphadenopathy Lungs- Clear to ausculation bilaterally, normal work of breathing.  No wheezes, rales, rhonchi Heart- Regular rate and rhythm, no murmurs, rubs or gallops, PMI not laterally displaced GI- soft, non-tender, non-distended, bowel sounds present, no hepatosplenomegaly Extremities- no clubbing or cyanosis. No edema; DP/PT/radial pulses 2+ bilaterally MS- no significant deformity or atrophy Skin- warm and dry, no rash or lesion; ICD pocket well healed Psych- euthymic mood, full affect Neuro- strength and sensation are intact  ICD interrogation- reviewed in detail today,  See PACEART report  EKG:  EKG is ordered today. Personal review of EKG ordered today shows BiV pacing at 66 bpm, QRS 188. Lead 1 with initial negative/isoelectric deflection and lead V1 with initially mildly positive deflection  Recent Labs: 10/02/2020: Hemoglobin 16.8; Platelets 118 06/29/2021: BUN 18; Creatinine, Ser 1.23; Potassium 4.1; Sodium 136   Wt Readings from Last 3 Encounters:  09/16/21 269 lb 9.6 oz (122.3 kg)  08/28/21 266 lb (120.7 kg)  07/17/21 268 lb 12.8 oz (121.9 kg)     Other studies Reviewed: Additional studies/ records that were reviewed today include: Previous EP office notes.   Assessment and Plan:  1.  Chronic systolic dysfunction s/p 09/16/21 CRT-D  euvolemic  today Stable on an appropriate medical regimen Normal ICD function See Pace Art report LV lead with gradual decrease in impedence and large change in threshold, though this is his first office visit since wound check 10/2020.  CXR to check placement and for any obvious changes to lead integrity.   2. HTN Stable on current regimen    3. OSA by history Encouraged nightly BiPAP.   4. Smoking Encouraged cessation  Current medicines are reviewed at length with the patient today.    Disposition:   Follow up with Dr. 11/2020 in 6 months   Signed, Graciela Husbands, PA-C  09/16/2021 10:02 AM  [facility] [address] [place] [phone] (office) [phone] (fax)

## 2021-09-16 NOTE — Patient Instructions (Signed)
Medication Instructions:  Your physician recommends that you continue on your current medications as directed. Please refer to the Current Medication list given to you today.  *If you need a refill on your cardiac medications before your next appointment, please call your pharmacy*   Lab Work: None  If you have labs (blood work) drawn today and your tests are completely normal, you will receive your results only by: MyChart Message (if you have MyChart) OR A paper copy in the mail If you have any lab test that is abnormal or we need to change your treatment, we will call you to review the results.   Testing/Procedures: A chest x-ray takes a picture of the organs and structures inside the chest, including the heart, lungs, and blood vessels. This test can show several things, including, whether the heart is enlarges; whether fluid is building up in the lungs; and whether pacemaker / defibrillator leads are still in place.  Chest X-ray Instructions:    1. You may have this done at the [facility], located in the [facility] Building on the 1st floor.    2. You do no have to have an appointment.    3. [address] [place], [place] [address]        [phone]        Monday - Friday  8:00 am - 5:00 pm   Follow-Up: At [facility], you and your health needs are our priority.  As part of our continuing mission to provide you with exceptional heart care, we have created designated Provider Care Teams.  These Care Teams include your primary Cardiologist (physician) and Advanced Practice Providers (APPs -  Physician Assistants and Nurse Practitioners) who all work together to provide you with the care you need, when you need it.  We recommend signing up for the patient portal called "MyChart".  Sign up information is provided on this After Visit Summary.  MyChart is used to connect with patients for Virtual Visits (Telemedicine).  Patients are able to view  lab/test results, encounter notes, upcoming appointments, etc.  Non-urgent messages can be sent to your provider as well.   To learn more about what you can do with MyChart, go to [email or web address].    Your next appointment:   6 month(s)  The format for your next appointment:   In Person  Provider:   You may see Sherryl Manges, MD or one of the following Advanced Practice Providers on your designated Care Team:   Francis Dowse, [place] "[facility]" [place], [place]

## 2021-09-17 ENCOUNTER — Ambulatory Visit
Admission: RE | Admit: 2021-09-17 | Discharge: 2021-09-17 | Disposition: A | Discharge status: Home / Self Care | Payer: Medicaid Other | Source: Ambulatory Visit | Attending: Student | Admitting: Student

## 2021-09-17 DIAGNOSIS — Z95810 Presence of automatic (implantable) cardiac defibrillator: Secondary | ICD-10-CM

## 2021-09-24 ENCOUNTER — Ambulatory Visit (INDEPENDENT_AMBULATORY_CARE_PROVIDER_SITE_OTHER): Payer: Medicaid Other

## 2021-09-24 ENCOUNTER — Other Ambulatory Visit: Payer: Self-pay

## 2021-09-24 DIAGNOSIS — I428 Other cardiomyopathies: Secondary | ICD-10-CM

## 2021-09-24 LAB — CUP PACEART INCLINIC DEVICE CHECK
Date Time Interrogation Session: 20221020133813
HighPow Impedance: 48 Ohm
Implantable Lead Implant Date: 20051122
Implantable Lead Implant Date: 20051122
Implantable Lead Implant Date: 20051122
Implantable Lead Location: 753858
Implantable Lead Location: 753859
Implantable Lead Location: 753860
Implantable Lead Model: 158
Implantable Lead Model: 4194
Implantable Lead Model: 5076
Implantable Lead Serial Number: 159458
Implantable Pulse Generator Implant Date: 20211101
Lead Channel Impedance Value: 439 Ohm
Lead Channel Impedance Value: 496 Ohm
Lead Channel Impedance Value: 560 Ohm
Lead Channel Pacing Threshold Amplitude: 1 V
Lead Channel Pacing Threshold Amplitude: 1.1 V
Lead Channel Pacing Threshold Amplitude: 1.3 V
Lead Channel Pacing Threshold Pulse Width: 0.4 ms
Lead Channel Pacing Threshold Pulse Width: 0.4 ms
Lead Channel Pacing Threshold Pulse Width: 1 ms
Lead Channel Sensing Intrinsic Amplitude: 15.9 mV
Lead Channel Sensing Intrinsic Amplitude: 4 mV
Lead Channel Sensing Intrinsic Amplitude: 8.4 mV
Lead Channel Setting Pacing Amplitude: 2 V
Lead Channel Setting Pacing Amplitude: 2.4 V
Lead Channel Setting Pacing Amplitude: 2.8 V
Lead Channel Setting Pacing Pulse Width: 0.4 ms
Lead Channel Setting Pacing Pulse Width: 1 ms
Lead Channel Setting Sensing Sensitivity: 0.5 mV
Lead Channel Setting Sensing Sensitivity: 1 mV
Pulse Gen Serial Number: 147594

## 2021-09-24 NOTE — Progress Notes (Signed)
Patient in device clinic for device check by Industry for LV vector testing per Otilio Saber, PA.  See attached report for details.  ECG completed with vector testing, no changes made due to prolonged QRS duration with alternate LV vectors- Presenting LV tip to LV ring- QRS duration , threshold 2.1V @ 54ms;  LV tip to RV- QRS duration 172 ms, threshold 1.1V @ 53ms;  LV tip to Can- QRS duration , threshold 1.1v@1ms .  Device remains programmed LV tip to Ring with output set at 2.8v@1ms .

## 2021-10-05 ENCOUNTER — Other Ambulatory Visit (HOSPITAL_COMMUNITY): Payer: Self-pay

## 2021-10-05 MED ORDER — ISOSORB DINITRATE-HYDRALAZINE 20-37.5 MG PO TABS
1.0000 | ORAL_TABLET | Freq: Two times a day (BID) | ORAL | 1 refills | Status: DC
Start: 2021-10-05 — End: 2022-06-30

## 2021-10-14 ENCOUNTER — Ambulatory Visit (INDEPENDENT_AMBULATORY_CARE_PROVIDER_SITE_OTHER): Payer: Medicaid Other

## 2021-10-14 DIAGNOSIS — I420 Dilated cardiomyopathy: Secondary | ICD-10-CM

## 2021-10-14 LAB — CUP PACEART REMOTE DEVICE CHECK
Battery Remaining Longevity: 108 mo
Battery Remaining Percentage: 100 %
Brady Statistic RA Percent Paced: 38 %
Brady Statistic RV Percent Paced: 99 %
Date Time Interrogation Session: 20221109093400
HighPow Impedance: 53 Ohm
Implantable Lead Implant Date: 20051122
Implantable Lead Implant Date: 20051122
Implantable Lead Implant Date: 20051122
Implantable Lead Location: 753858
Implantable Lead Location: 753859
Implantable Lead Location: 753860
Implantable Lead Model: 158
Implantable Lead Model: 4194
Implantable Lead Model: 5076
Implantable Lead Serial Number: 159458
Implantable Pulse Generator Implant Date: 20211101
Lead Channel Impedance Value: 482 Ohm
Lead Channel Impedance Value: 548 Ohm
Lead Channel Impedance Value: 635 Ohm
Lead Channel Setting Pacing Amplitude: 2 V
Lead Channel Setting Pacing Amplitude: 2.4 V
Lead Channel Setting Pacing Amplitude: 2.8 V
Lead Channel Setting Pacing Pulse Width: 0.4 ms
Lead Channel Setting Pacing Pulse Width: 1 ms
Lead Channel Setting Sensing Sensitivity: 0.5 mV
Lead Channel Setting Sensing Sensitivity: 1 mV
Pulse Gen Serial Number: 147594

## 2021-10-22 NOTE — Progress Notes (Signed)
Remote ICD transmission.   

## 2021-10-26 ENCOUNTER — Other Ambulatory Visit: Payer: Self-pay

## 2021-10-26 ENCOUNTER — Ambulatory Visit (HOSPITAL_COMMUNITY)
Admission: RE | Admit: 2021-10-26 | Discharge: 2021-10-26 | Disposition: A | Discharge status: Home / Self Care | Payer: Medicaid Other | Source: Ambulatory Visit | Attending: Cardiology | Admitting: Cardiology

## 2021-10-26 ENCOUNTER — Encounter (HOSPITAL_COMMUNITY): Payer: Self-pay | Admitting: Cardiology

## 2021-10-26 VITALS — BP 92/50 | HR 60 | Wt 275.8 lb

## 2021-10-26 DIAGNOSIS — G4733 Obstructive sleep apnea (adult) (pediatric): Secondary | ICD-10-CM

## 2021-10-26 DIAGNOSIS — F17210 Nicotine dependence, cigarettes, uncomplicated: Secondary | ICD-10-CM | POA: Diagnosis not present

## 2021-10-26 DIAGNOSIS — G4731 Primary central sleep apnea: Secondary | ICD-10-CM | POA: Insufficient documentation

## 2021-10-26 DIAGNOSIS — I471 Supraventricular tachycardia: Secondary | ICD-10-CM

## 2021-10-26 DIAGNOSIS — I428 Other cardiomyopathies: Secondary | ICD-10-CM | POA: Insufficient documentation

## 2021-10-26 DIAGNOSIS — I5022 Chronic systolic (congestive) heart failure: Secondary | ICD-10-CM | POA: Diagnosis not present

## 2021-10-26 DIAGNOSIS — I4719 Other supraventricular tachycardia: Secondary | ICD-10-CM

## 2021-10-26 DIAGNOSIS — Z7901 Long term (current) use of anticoagulants: Secondary | ICD-10-CM | POA: Diagnosis not present

## 2021-10-26 DIAGNOSIS — Z79899 Other long term (current) drug therapy: Secondary | ICD-10-CM | POA: Diagnosis not present

## 2021-10-26 LAB — BASIC METABOLIC PANEL
Anion gap: 6 (ref 5–15)
BUN: 20 mg/dL (ref 6–20)
CO2: 25 mmol/L (ref 22–32)
Calcium: 8.9 mg/dL (ref 8.9–10.3)
Chloride: 105 mmol/L (ref 98–111)
Creatinine, Ser: 1.07 mg/dL (ref 0.61–1.24)
GFR, Estimated: >60 mL/min (ref >60)
Glucose, Bld: 109 mg/dL — ABNORMAL HIGH (ref 70–99)
Potassium: 4.3 mmol/L (ref 3.5–5.1)
Sodium: 136 mmol/L (ref 135–145)

## 2021-10-26 LAB — BRAIN NATRIURETIC PEPTIDE: B Natriuretic Peptide: 82.8 pg/mL (ref 0.0–100.0)

## 2021-10-26 MED ORDER — CARVEDILOL 25 MG PO TABS: 25.0000 mg | ORAL_TABLET | Freq: Two times a day (BID) | ORAL | 3 refills | Status: DC

## 2021-10-26 MED ORDER — CARVEDILOL 25 MG PO TABS: 25.0000 mg | ORAL_TABLET | Freq: Two times a day (BID) | ORAL | 2 refills | Status: DC

## 2021-10-26 NOTE — Progress Notes (Signed)
Cardiology: Dr. Graciela Husbands HF Cardiology: Dr. Shirlee Latch  60 y.o. with history of nonischemic cardiomyopathy with a long history of cardiomyopathy.  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  Echo in 7/18 showed EF 20-25% with diffuse hypokinesis and dilated RV.  He has a strong family history of CHF (father, sister, half-brother).  Sleep study in 12/18 with severe central sleep apnea, now on Bipap.  Echo in 8/19 showed EF 20-25%, moderate MR, mildly decreased RV systolic function.    Echo in 11/20 showed EF 25-30%, mildly decreased RV systolic function.  Echo in 3/22 showed EF 25-30%, diffuse hypokinesis, mildly decreased RV systolic function.   He returns for HF followup.  He is still smoking occasionally.  He continues to work as a Arboriculturist.  No exertional dyspnea or chest pain.  No lightheadedness.  He is using his Bipap at night. Weight is up 7 lbs.    ECG (personally reviewed): NSR, BiV pacing, QTc 517 msec  [employer] device interrogation: HeartLogic 5, 99% BiV pacing  Labs (8/18): K 4.3, creatinine 0.88 Labs (08/17/2017) K 4.1 Creatinine 1.17, BNP 114 Labs (11/18): K 3.9, creatinine 1.14 Labs (4/19): K 4.5, creatinine 1.06 Labs (6/19): K 4, creatinine 1.09 Labs (9/19): K 3.9, creatinine 1.36 Labs (11/19): K 4.4, creatinine 1.27 Labs (3/20): K 4.5, creatinine 1.31 Labs (8/20): K 4.4, creatinine 1.34 Labs (12/20): K 4.8, creatinine 1.53 Labs (7/21): K 4, creatinine 1.46 Labs (9/21): LDL 56, TGs 337 Labs (10/21): K 3.9, creatinine 1.35 Labs (3/22): LDL 30 Labs (7/22): K 4.1, creatinine 1.23  PMH: 1. Central sleep apnea: Uses Bipap.  2. LBBB: s/p [employer] CRT-D.   3. 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  4. History of atrial tachycardia 5. Active smoker 6. Chronic systolic CHF: nonischemic cardiomyopathy.  Known since prior to 2005.  [employer] CRT-D.  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  - Echo (7/18): EF 20-25%, severe dilated LV, diffuse hypokinesis, severe  RV dilation with low normal systolic function, moderate TR, PASP 52 mmHg.  - Gynecomastia with spironolactone.  - Echo (8/19): EF 20-25%, moderate MR, mildly decreased RV systolic function.  - CPX (9/19): peak VO2 14.4, VE/VCO2 slope 40, RER 1.08.  Moderate functional limitation due to HF and body habitus.  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  - Echo (11/20): EF 25-30%, mildly decreased RV systolic function.  - Echo (3/22): EF 25-30%, diffuse hypokinesis, mildly decreased RV systolic function.   SH: Smokes 4 cigs/day, rare ETOH, occasional marijuana.   Lives with friend in [place].   FH: Father with cardiomyopathy, 1/2 brother died from CHF, sister with CHF.   ROS: All systems reviewed and negative except as per HPI.   Current Outpatient Medications  Medication Sig Dispense Refill   aspirin 81 MG tablet Take 81 mg by mouth daily.     atorvastatin (LIPITOR) 40 MG tablet Take 1 tablet (40 mg total) by mouth daily. 90 tablet 3   buPROPion (WELLBUTRIN XL) 150 MG 24 hr tablet Take 150 mg by mouth 2 (two) times daily.     cetirizine (ZYRTEC) 10 MG chewable tablet Chew 10 mg by mouth daily.     ENTRESTO 97-103 MG TAKE 1 TABLET BY MOUTH TWICE DAILY 180 tablet 3   eplerenone (INSPRA) 50 MG tablet TAKE 1 TABLET(50 MG) BY MOUTH DAILY 30 tablet 5   FARXIGA 10 MG TABS tablet TAKE 1 TABLET(10 MG) BY MOUTH DAILY BEFORE BREAKFAST 90 tablet 3   furosemide (LASIX)  40 MG tablet TAKE 1 TABLET BY MOUTH EVERY MORNING AND 1/2 TABLET EVERY EVENING 90 tablet 3   isosorbide-hydrALAZINE (BIDIL) 20-37.5 MG tablet Take 1 tablet by mouth in the morning and at bedtime. 60 tablet 1   mometasone (NASONEX) 50 MCG/ACT nasal spray Place 2 sprays into the nose as needed.     potassium chloride SA (KLOR-CON) 20 MEQ tablet TAKE 1 TABLET(20 MEQ) BY MOUTH DAILY 90 tablet 3   sotalol (BETAPACE) 80 MG tablet TAKE 1 TABLET(80 MG) BY MOUTH TWICE DAILY 180 tablet 3   carvedilol (COREG) 25 MG tablet Take  1 tablet (25 mg total) by mouth 2 (two) times daily with a meal. 180 tablet 3   No current facility-administered medications for this encounter.   BP (!) 92/50   Pulse 60   Wt 125.1 kg (275 lb 12.8 oz)   SpO2 97%   BMI 39.57 kg/m   General: NAD, obese Neck: No JVD, no thyromegaly or thyroid nodule.  Lungs: Clear to auscultation bilaterally with normal respiratory effort. CV: Nondisplaced PMI.  Heart regular S1/S2, no S3/S4, no murmur.  No peripheral edema.  No carotid bruit.  Normal pedal pulses.  Abdomen: Soft, nontender, no hepatosplenomegaly, no distention.  Skin: Intact without lesions or rashes.  Neurologic: Alert and oriented x 3.  Psych: Normal affect. Extremities: No clubbing or cyanosis.  HEENT: Normal.   Assessment/Plan: 1. Chronic systolic CHF: Nonischemic cardiomyopathy x years.  [employer] CRT-D device.  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  Echo in 8/19 with EF 20-25%, echo in 11/20 showed EF 25-30%, echo today showed stable EF 25-30%.  CPX in 9/19 showed moderate functional limitation from heart failure.  He saw Dr. 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  He is not volume overloaded on exam or by HeartLogic, NYHA class I-II symptoms.  - Continue Lasix 40 qam/20 qpm.  BMET today.  - Continue dapagliflozin 10 mg daily.   - Continue Entresto 97/103 bid.  - Continue Coreg 25 mg bid.  - Intolerant spiro due to gynecomastia.  Continue eplerenone 50 mg daily.    - Continue Bidil 1 tabs bid, unable to tolerate higher dose due to lightheadedness.   - Taking sotalol b/c of high DFTs.  ECG showed paced QTc 517 msec (keep paced QTc < 550 msec).  - Repeat echo at followup in 3/23.  2. Smoking: Discussed smoking cessation again.   - Continue bupropion.  3. Atrial Tachycardia: He is on sotalol.  4. Central sleep apnea: Continue Bipap.    Followup in 3 months with echo.   Marca Ancona 10/26/2021

## 2021-10-26 NOTE — Patient Instructions (Addendum)
EKG done today.  Labs done today. We will contact you only if your labs are abnormal.  Your Carvedilol has been refilled.   No medication changes were made. Please continue all current medications as prescribed.  Your physician recommends that you schedule a follow-up appointment in: 4 months with an echo prior to your exam.  Your physician has requested that you have an echocardiogram. Echocardiography is a painless test that uses sound waves to create images of your heart. It provides your doctor with information about the size and shape of your heart and how well your heart's chambers and valves are working. This procedure takes approximately one hour. There are no restrictions for this procedure.  If you have any questions or concerns before your next appointment please send [place] a message through [place] or call our office at [phone].    TO LEAVE A MESSAGE FOR THE NURSE SELECT OPTION 2, PLEASE LEAVE A MESSAGE INCLUDING: YOUR NAME DATE OF BIRTH CALL BACK NUMBER REASON FOR CALL**this is important as we prioritize the call backs  YOU WILL RECEIVE A CALL BACK THE SAME DAY AS LONG AS YOU CALL BEFORE 4:00 PM   Do the following things EVERYDAY: Weigh yourself in the morning before breakfast. Write it down and keep it in a log. Take your medicines as prescribed Eat low salt foods--Limit salt (sodium) to 2000 mg per day.  Stay as active as you can everyday Limit all fluids for the day to less than 2 liters   At the [facility], you and your health needs are our priority. As part of our continuing mission to provide you with exceptional heart care, we have created designated Provider Care Teams. These Care Teams include your primary Cardiologist (physician) and Advanced Practice Providers (APPs- Physician Assistants and Nurse Practitioners) who all work together to provide you with the care you need, when you need it.   You may see any of the following providers on  your designated Care Team at your next follow up: Dr Arvilla Meres Dr Carron Curie, NP Robbie Lis, [place] Karle Plumber, PharmD   Please be sure to bring in all your medications bottles to every appointment.

## 2021-10-27 NOTE — Addendum Note (Signed)
Encounter addended by: Laurey Morale, MD on: 10/27/2021 12:02 AM  Actions taken: Level of Service modified, Visit diagnoses modified

## 2021-11-24 ENCOUNTER — Other Ambulatory Visit (HOSPITAL_COMMUNITY): Payer: Self-pay | Admitting: Cardiology

## 2021-12-14 ENCOUNTER — Other Ambulatory Visit: Payer: Self-pay

## 2021-12-14 MED ORDER — ENTRESTO 97-103 MG PO TABS: 1.0000 | ORAL_TABLET | Freq: Two times a day (BID) | ORAL | 3 refills | Status: DC

## 2021-12-17 ENCOUNTER — Other Ambulatory Visit (HOSPITAL_COMMUNITY): Payer: Self-pay

## 2021-12-17 ENCOUNTER — Telehealth (HOSPITAL_COMMUNITY): Payer: Self-pay | Admitting: Pharmacist

## 2021-12-17 NOTE — Telephone Encounter (Signed)
Patient Advocate Encounter   Received notification from [facility] that prior authorization for Spencer Robertson is required.   PA submitted on CoverMyMeds Key [id number] Status is pending   Will continue to follow.   Karle Plumber, PharmD, BCPS, BCCP, CPP Heart Failure Clinic Pharmacist [phone]

## 2021-12-17 NOTE — Telephone Encounter (Signed)
Advanced Heart Failure Patient Advocate Encounter  Prior Authorization for Delene Loll has been approved.     Effective dates: 12/03/21 through 12/17/22  Audry Riles, PharmD, BCPS, BCCP, CPP Heart Failure Clinic Pharmacist [phone]

## 2021-12-19 ENCOUNTER — Other Ambulatory Visit (HOSPITAL_COMMUNITY): Payer: Self-pay | Admitting: Cardiology

## 2021-12-22 ENCOUNTER — Other Ambulatory Visit: Payer: Self-pay | Admitting: Cardiology

## 2021-12-22 ENCOUNTER — Ambulatory Visit: Payer: Medicaid Other | Admitting: Cardiology

## 2021-12-30 ENCOUNTER — Telehealth: Payer: Medicaid Other | Admitting: Cardiology

## 2021-12-30 ENCOUNTER — Other Ambulatory Visit: Payer: Self-pay

## 2022-01-25 ENCOUNTER — Ambulatory Visit (INDEPENDENT_AMBULATORY_CARE_PROVIDER_SITE_OTHER): Payer: Medicaid Other

## 2022-01-25 DIAGNOSIS — I420 Dilated cardiomyopathy: Secondary | ICD-10-CM

## 2022-01-26 LAB — CUP PACEART REMOTE DEVICE CHECK
Battery Remaining Longevity: 114 mo
Battery Remaining Percentage: 100 %
Brady Statistic RA Percent Paced: 31 %
Brady Statistic RV Percent Paced: 99 %
Date Time Interrogation Session: 20230219144600
HighPow Impedance: 46 Ohm
Implantable Lead Implant Date: 20051122
Implantable Lead Implant Date: 20051122
Implantable Lead Implant Date: 20051122
Implantable Lead Location: 753858
Implantable Lead Location: 753859
Implantable Lead Location: 753860
Implantable Lead Model: 158
Implantable Lead Model: 4194
Implantable Lead Model: 5076
Implantable Lead Serial Number: 159458
Implantable Pulse Generator Implant Date: 20211101
Lead Channel Impedance Value: 446 Ohm
Lead Channel Impedance Value: 495 Ohm
Lead Channel Impedance Value: 865 Ohm
Lead Channel Setting Pacing Amplitude: 2 V
Lead Channel Setting Pacing Amplitude: 2.4 V
Lead Channel Setting Pacing Amplitude: 2.8 V
Lead Channel Setting Pacing Pulse Width: 0.4 ms
Lead Channel Setting Pacing Pulse Width: 1 ms
Lead Channel Setting Sensing Sensitivity: 0.5 mV
Lead Channel Setting Sensing Sensitivity: 1 mV
Pulse Gen Serial Number: 147594

## 2022-01-29 ENCOUNTER — Ambulatory Visit (HOSPITAL_COMMUNITY): Payer: Medicaid Other

## 2022-01-29 ENCOUNTER — Encounter (HOSPITAL_COMMUNITY): Payer: Medicaid Other | Admitting: Cardiology

## 2022-02-01 NOTE — Progress Notes (Signed)
Remote ICD transmission.   

## 2022-02-25 ENCOUNTER — Ambulatory Visit (INDEPENDENT_AMBULATORY_CARE_PROVIDER_SITE_OTHER): Payer: Medicaid Other | Admitting: Primary Care

## 2022-03-16 ENCOUNTER — Other Ambulatory Visit (HOSPITAL_COMMUNITY): Payer: Self-pay | Admitting: Cardiology

## 2022-03-20 ENCOUNTER — Other Ambulatory Visit (HOSPITAL_COMMUNITY): Payer: Self-pay | Admitting: Cardiology

## 2022-03-22 ENCOUNTER — Other Ambulatory Visit (HOSPITAL_COMMUNITY): Payer: Self-pay | Admitting: Cardiology

## 2022-04-23 ENCOUNTER — Telehealth (INDEPENDENT_AMBULATORY_CARE_PROVIDER_SITE_OTHER): Payer: Self-pay | Admitting: Primary Care

## 2022-04-23 NOTE — Telephone Encounter (Signed)
Attempted to reach [facility]. In the middle of leaving message call was placed back in the hold que. This echo was ordered by cardiology and PA would need to be done by their office.

## 2022-04-23 NOTE — Telephone Encounter (Signed)
Copied from [place] [id number]. Topic: General - Other >> Apr 23, 2022  2:53 PM Tessa Lerner A wrote: Reason for CRM: Dani with [facility] pre [facility] has called to notify the practice a prior authorization that is required for the patient's TD echocardiogram on 04/27/21  Please contact further when possible

## 2022-04-26 ENCOUNTER — Ambulatory Visit (INDEPENDENT_AMBULATORY_CARE_PROVIDER_SITE_OTHER): Payer: Medicaid Other

## 2022-04-26 DIAGNOSIS — I420 Dilated cardiomyopathy: Secondary | ICD-10-CM | POA: Diagnosis not present

## 2022-04-26 DIAGNOSIS — I5022 Chronic systolic (congestive) heart failure: Secondary | ICD-10-CM

## 2022-04-26 NOTE — Telephone Encounter (Signed)
Left message asking Valentino Saxon to return call

## 2022-04-27 ENCOUNTER — Ambulatory Visit (HOSPITAL_COMMUNITY)
Admission: RE | Admit: 2022-04-27 | Discharge: 2022-04-27 | Disposition: A | Discharge status: Home / Self Care | Payer: Medicaid Other | Source: Ambulatory Visit | Attending: Cardiology | Admitting: Cardiology

## 2022-04-27 ENCOUNTER — Ambulatory Visit (HOSPITAL_BASED_OUTPATIENT_CLINIC_OR_DEPARTMENT_OTHER)
Admission: RE | Admit: 2022-04-27 | Discharge: 2022-04-27 | Disposition: A | Discharge status: Home / Self Care | Payer: Medicaid Other | Source: Ambulatory Visit | Attending: Cardiology | Admitting: Cardiology

## 2022-04-27 ENCOUNTER — Encounter (HOSPITAL_COMMUNITY): Payer: Self-pay | Admitting: Cardiology

## 2022-04-27 VITALS — BP 80/60 | HR 64 | Wt 259.0 lb

## 2022-04-27 DIAGNOSIS — Z716 Tobacco abuse counseling: Secondary | ICD-10-CM | POA: Insufficient documentation

## 2022-04-27 DIAGNOSIS — I428 Other cardiomyopathies: Secondary | ICD-10-CM | POA: Diagnosis not present

## 2022-04-27 DIAGNOSIS — I5022 Chronic systolic (congestive) heart failure: Secondary | ICD-10-CM | POA: Diagnosis present

## 2022-04-27 DIAGNOSIS — Z79899 Other long term (current) drug therapy: Secondary | ICD-10-CM | POA: Insufficient documentation

## 2022-04-27 DIAGNOSIS — Z7901 Long term (current) use of anticoagulants: Secondary | ICD-10-CM | POA: Insufficient documentation

## 2022-04-27 DIAGNOSIS — Z8249 Family history of ischemic heart disease and other diseases of the circulatory system: Secondary | ICD-10-CM | POA: Diagnosis not present

## 2022-04-27 DIAGNOSIS — F17210 Nicotine dependence, cigarettes, uncomplicated: Secondary | ICD-10-CM | POA: Diagnosis not present

## 2022-04-27 DIAGNOSIS — I471 Supraventricular tachycardia: Secondary | ICD-10-CM | POA: Insufficient documentation

## 2022-04-27 DIAGNOSIS — G4731 Primary central sleep apnea: Secondary | ICD-10-CM | POA: Insufficient documentation

## 2022-04-27 DIAGNOSIS — Z9989 Dependence on other enabling machines and devices: Secondary | ICD-10-CM | POA: Diagnosis not present

## 2022-04-27 LAB — CUP PACEART REMOTE DEVICE CHECK
Battery Remaining Longevity: 114 mo
Battery Remaining Percentage: 100 %
Brady Statistic RA Percent Paced: 29 %
Brady Statistic RV Percent Paced: 99 %
Date Time Interrogation Session: 20230523085800
HighPow Impedance: 49 Ohm
Implantable Lead Implant Date: 20051122
Implantable Lead Implant Date: 20051122
Implantable Lead Implant Date: 20051122
Implantable Lead Location: 753858
Implantable Lead Location: 753859
Implantable Lead Location: 753860
Implantable Lead Model: 158
Implantable Lead Model: 4194
Implantable Lead Model: 5076
Implantable Lead Serial Number: 159458
Implantable Pulse Generator Implant Date: 20211101
Lead Channel Impedance Value: 463 Ohm
Lead Channel Impedance Value: 536 Ohm
Lead Channel Impedance Value: 887 Ohm
Lead Channel Setting Pacing Amplitude: 2 V
Lead Channel Setting Pacing Amplitude: 2.4 V
Lead Channel Setting Pacing Amplitude: 2.8 V
Lead Channel Setting Pacing Pulse Width: 0.4 ms
Lead Channel Setting Pacing Pulse Width: 1 ms
Lead Channel Setting Sensing Sensitivity: 0.5 mV
Lead Channel Setting Sensing Sensitivity: 1 mV
Pulse Gen Serial Number: 147594

## 2022-04-27 LAB — ECHOCARDIOGRAM COMPLETE
Area-P 1/2: 2.11 cm2
Calc EF: 29.7 %
S' Lateral: 5 cm
Single Plane A2C EF: 28.6 %
Single Plane A4C EF: 30.1 %

## 2022-04-27 LAB — BASIC METABOLIC PANEL
Anion gap: 8 (ref 5–15)
BUN: 16 mg/dL (ref 6–20)
CO2: 24 mmol/L (ref 22–32)
Calcium: 9.1 mg/dL (ref 8.9–10.3)
Chloride: 105 mmol/L (ref 98–111)
Creatinine, Ser: 1.2 mg/dL (ref 0.61–1.24)
GFR, Estimated: >60 mL/min (ref >60)
Glucose, Bld: 94 mg/dL (ref 70–99)
Potassium: 3.8 mmol/L (ref 3.5–5.1)
Sodium: 137 mmol/L (ref 135–145)

## 2022-04-27 LAB — BRAIN NATRIURETIC PEPTIDE: B Natriuretic Peptide: 145.1 pg/mL — ABNORMAL HIGH (ref 0.0–100.0)

## 2022-04-27 NOTE — Progress Notes (Signed)
Cardiology: Dr. Caryl Comes HF Cardiology: Dr. Aundra Dubin  61 y.o. with history of nonischemic cardiomyopathy with a long history of cardiomyopathy.  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  Echo in 7/18 showed EF 20-25% with diffuse hypokinesis and dilated RV.  He has a strong family history of CHF (father, sister, half-brother).  Sleep study in 12/18 with severe central sleep apnea, now on Bipap.  Echo in 8/19 showed EF 20-25%, moderate MR, mildly decreased RV systolic function.    Echo in 11/20 showed EF 25-30%, mildly decreased RV systolic function.  Echo in 3/22 showed EF 25-30%, diffuse hypokinesis, mildly decreased RV systolic function.   Echo today showed EF 30-35%, diffuse hypokinesis, mildly decreased RV systolic function.   He returns for HF followup.  He is still smoking occasionally.  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  Weight is down 16 lbs.  BP is low today but he denies lightheadedness.  No significant exertional dyspnea.  No problem walking up stairs.  No orthopnea/PND.   ECG (personally reviewed): NSR, BiV pacing, QTc 536 msec  [employer] device interrogation: HeartLogic 2  Labs (8/18): K 4.3, creatinine 0.88 Labs (08/17/2017) K 4.1 Creatinine 1.17, BNP 114 Labs (11/18): K 3.9, creatinine 1.14 Labs (4/19): K 4.5, creatinine 1.06 Labs (6/19): K 4, creatinine 1.09 Labs (9/19): K 3.9, creatinine 1.36 Labs (11/19): K 4.4, creatinine 1.27 Labs (3/20): K 4.5, creatinine 1.31 Labs (8/20): K 4.4, creatinine 1.34 Labs (12/20): K 4.8, creatinine 1.53 Labs (7/21): K 4, creatinine 1.46 Labs (9/21): LDL 56, TGs 337 Labs (10/21): K 3.9, creatinine 1.35 Labs (3/22): LDL 30 Labs (7/22): K 4.1, creatinine 1.23 Labs (11/22): BNP 83, K 4.3, creatinine 1.07  PMH: 1. Central sleep apnea: Uses Bipap.  2. LBBB: s/p [employer] CRT-D.   3. 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  4. History of atrial tachycardia 5. Active smoker 6. Chronic systolic CHF: nonischemic  cardiomyopathy.  Known since prior to 2005.  [place] CRT-D.  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  - Echo (7/18): EF 20-25%, severe dilated LV, diffuse hypokinesis, severe RV dilation with low normal systolic function, moderate TR, PASP 52 mmHg.  - Gynecomastia with spironolactone.  - Echo (8/19): EF 20-25%, moderate MR, mildly decreased RV systolic function.  - CPX (9/19): peak VO2 14.4, VE/VCO2 slope 40, RER 1.08.  Moderate functional limitation due to HF and body habitus.  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  - Echo (11/20): EF 25-30%, mildly decreased RV systolic function.  - Echo (3/22): EF 25-30%, diffuse hypokinesis, mildly decreased RV systolic function.  - Echo (5/23): EF 30-35%, diffuse hypokinesis, mildly decreased RV systolic function.  SH: Smokes 4 cigs/day, rare ETOH, occasional marijuana.   Lives with friend in [place].   FH: Father with cardiomyopathy, 1/2 brother died from CHF, sister with CHF.   ROS: All systems reviewed and negative except as per HPI.   Current Outpatient Medications  Medication Sig Dispense Refill   aspirin 81 MG tablet Take 81 mg by mouth daily.     atorvastatin (LIPITOR) 40 MG tablet Take 1 tablet (40 mg total) by mouth daily. 90 tablet 3   buPROPion (WELLBUTRIN XL) 150 MG 24 hr tablet Take 150 mg by mouth 2 (two) times daily.     carvedilol (COREG) 25 MG tablet Take 1 tablet (25 mg total) by mouth 2 (two) times daily with a meal. 180 tablet 3   cetirizine (ZYRTEC) 10 MG chewable tablet Chew 10  mg by mouth daily.     eplerenone (INSPRA) 50 MG tablet TAKE 1 TABLET(50 MG) BY MOUTH DAILY 30 tablet 5   FARXIGA 10 MG TABS tablet TAKE 1 TABLET(10 MG) BY MOUTH DAILY BEFORE AND BREAKFAST 90 tablet 3   furosemide (LASIX) 40 MG tablet Take 1 tablet (40 mg total) by mouth every morning AND 0.5 tablets (20 mg total) every evening. 135 tablet 3   isosorbide-hydrALAZINE (BIDIL) 20-37.5 MG tablet Take 1 tablet by mouth in the  morning and at bedtime. 60 tablet 1   mometasone (NASONEX) 50 MCG/ACT nasal spray Place 2 sprays into the nose as needed.     potassium chloride SA (KLOR-CON) 20 MEQ tablet TAKE 1 TABLET(20 MEQ) BY MOUTH DAILY 90 tablet 3   sacubitril-valsartan (ENTRESTO) 97-103 MG Take 1 tablet by mouth 2 (two) times daily. 180 tablet 3   sotalol (BETAPACE) 80 MG tablet TAKE 1 TABLET(80 MG) BY MOUTH TWICE DAILY 180 tablet 3   No current facility-administered medications for this encounter.   BP (!) 80/60   Pulse 64   Wt 117.5 kg (259 lb)   SpO2 95%   BMI 37.16 kg/m   General: NAD Neck: No JVD, no thyromegaly or thyroid nodule.  Lungs: Clear to auscultation bilaterally with normal respiratory effort. CV: Nondisplaced PMI.  Heart regular S1/S2, no S3/S4, no murmur.  No peripheral edema.  No carotid bruit.  Normal pedal pulses.  Abdomen: Soft, nontender, no hepatosplenomegaly, no distention.  Skin: Intact without lesions or rashes.  Neurologic: Alert and oriented x 3.  Psych: Normal affect. Extremities: No clubbing or cyanosis.  HEENT: Normal.   Assessment/Plan: 1. Chronic systolic CHF: Nonischemic cardiomyopathy x years.  [employer] CRT-D device.  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  Echo in 8/19 with EF 20-25%, echo in 11/20 showed EF 25-30%, echo today showed EF 30-35%.  CPX in 9/19 showed moderate functional limitation from heart failure.  He saw Dr. 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  He is not volume overloaded on exam or by HeartLogic, NYHA class I-II symptoms. BP runs low at times but no orthostatic symptoms and normal creatinine.  - Continue Lasix 40 qam/20 qpm.  BMET today.  - Continue dapagliflozin 10 mg daily.   - Continue Entresto 97/103 bid.  - Continue Coreg 25 mg bid.  - Intolerant spiro due to gynecomastia.  Continue eplerenone 50 mg daily.    - Continue Bidil 1 tabs bid, unable  to tolerate higher dose due to lightheadedness.   - Taking sotalol b/c of high DFTs.  ECG showed paced QTc 536 msec (keep paced QTc < 550 msec).  2. Smoking: Discussed smoking cessation again.   - Continue bupropion.  3. Atrial Tachycardia: He is on sotalol.  4. Central sleep apnea: Continue Bipap.    Followup in 4 months with APP.   Loralie Champagne 04/27/2022

## 2022-04-27 NOTE — Progress Notes (Signed)
  Echocardiogram 2D Echocardiogram has been performed.  Spencer Robertson M 04/27/2022, 8:33 AM

## 2022-04-27 NOTE — Patient Instructions (Signed)
There has been no changes to your medications.  Labs done today, your results will be available in MyChart, we will contact you for abnormal readings.  Your physician recommends that you schedule a follow-up appointment in: 4 months   If you have any questions or concerns before your next appointment please send us a message through mychart or call our office at [phone].    TO LEAVE A MESSAGE FOR THE NURSE SELECT OPTION 2, PLEASE LEAVE A MESSAGE INCLUDING: YOUR NAME DATE OF BIRTH CALL BACK NUMBER REASON FOR CALL**this is important as we prioritize the call backs  YOU WILL RECEIVE A CALL BACK THE SAME DAY AS LONG AS YOU CALL BEFORE 4:00 PM  At the [facility], you and your health needs are our priority. As part of our continuing mission to provide you with exceptional heart care, we have created designated Provider Care Teams. These Care Teams include your primary Cardiologist (physician) and Advanced Practice Providers (APPs- Physician Assistants and Nurse Practitioners) who all work together to provide you with the care you need, when you need it.   You may see any of the following providers on your designated Care Team at your next follow up: Dr Daniel Bensimhon Dr Dalton McLean Amy Clegg, NP Brittainy Simmons, PA Jessica Milford,NP Lindsay Finch, PA Lauren Kemp, PharmD   Please be sure to bring in all your medications bottles to every appointment.    

## 2022-05-04 ENCOUNTER — Ambulatory Visit (INDEPENDENT_AMBULATORY_CARE_PROVIDER_SITE_OTHER): Payer: Medicaid Other | Admitting: Primary Care

## 2022-05-12 NOTE — Progress Notes (Signed)
Remote ICD transmission.   

## 2022-05-26 ENCOUNTER — Ambulatory Visit (INDEPENDENT_AMBULATORY_CARE_PROVIDER_SITE_OTHER): Payer: Medicaid Other | Admitting: Primary Care

## 2022-05-26 ENCOUNTER — Encounter (INDEPENDENT_AMBULATORY_CARE_PROVIDER_SITE_OTHER): Payer: Self-pay | Admitting: Primary Care

## 2022-05-26 VITALS — BP 112/79 | HR 60 | Temp 98.0°F | Ht 70.0 in | Wt 256.6 lb

## 2022-05-26 DIAGNOSIS — R7303 Prediabetes: Secondary | ICD-10-CM

## 2022-05-26 DIAGNOSIS — Z6836 Body mass index (BMI) 36.0-36.9, adult: Secondary | ICD-10-CM | POA: Diagnosis not present

## 2022-05-26 DIAGNOSIS — Z1211 Encounter for screening for malignant neoplasm of colon: Secondary | ICD-10-CM

## 2022-05-26 DIAGNOSIS — E669 Obesity, unspecified: Secondary | ICD-10-CM | POA: Diagnosis not present

## 2022-05-26 LAB — POCT GLYCOSYLATED HEMOGLOBIN (HGB A1C): Hemoglobin A1C: 6.3 % — AB (ref 4.0–5.6)

## 2022-05-26 NOTE — Progress Notes (Unsigned)
[facility]  Spencer Robertson, is a 61 y.o. male  EGB:[id number]  [id number]  DOB - 01-09-1961  Chief Complaint  Patient presents with   Prediabetes       Subjective:   Spencer Robertson is a 61 y.o. male here today for a follow up visit for prediabetes. Exercising regularly? - Yes Watching carbohydrate intake? - Yes Neuropathy ? - No Hypoglycemic events - No Recovers with :   Pertinent ROS:  Polyuria - No Polydipsia - No Vision problems - No  Patient has No headache, No chest pain, No abdominal pain - No Nausea, No new weakness tingling or numbness, No Cough - shortness of breath  No problems updated.  Allergies  Allergen Reactions   Spironolactone Other (See Comments)    gynecomastia    Past Medical History:  Diagnosis Date   CHF (congestive heart failure) (HCC)    Hyperlipidemia    Hypertension    Mitral regurgitation    Nonischemic cardiomyopathy (HCC)    Obesity    Obesity (BMI 30-39.9) 09/01/2015   OSA (obstructive sleep apnea)    severe   S/P implantation of automatic cardioverter/defibrillator (AICD)    Guidant Contact [id number]   Tobacco abuse     Current Outpatient Medications on File Prior to Visit  Medication Sig Dispense Refill   aspirin 81 MG tablet Take 81 mg by mouth daily.     atorvastatin (LIPITOR) 40 MG tablet Take 1 tablet (40 mg total) by mouth daily. 90 tablet 3   buPROPion (WELLBUTRIN XL) 150 MG 24 hr tablet Take 150 mg by mouth 2 (two) times daily.     carvedilol (COREG) 25 MG tablet Take 1 tablet (25 mg total) by mouth 2 (two) times daily with a meal. 180 tablet 3   cetirizine (ZYRTEC) 10 MG chewable tablet Chew 10 mg by mouth daily.     eplerenone (INSPRA) 50 MG tablet TAKE 1 TABLET(50 MG) BY MOUTH DAILY 30 tablet 5   FARXIGA 10 MG TABS tablet TAKE 1 TABLET(10 MG) BY MOUTH DAILY BEFORE AND BREAKFAST 90 tablet 3   furosemide (LASIX) 40 MG tablet Take 1 tablet (40 mg total) by mouth every morning AND 0.5 tablets (20 mg total)  every evening. 135 tablet 3   isosorbide-hydrALAZINE (BIDIL) 20-37.5 MG tablet Take 1 tablet by mouth in the morning and at bedtime. 60 tablet 1   mometasone (NASONEX) 50 MCG/ACT nasal spray Place 2 sprays into the nose as needed.     potassium chloride SA (KLOR-CON) 20 MEQ tablet TAKE 1 TABLET(20 MEQ) BY MOUTH DAILY 90 tablet 3   sacubitril-valsartan (ENTRESTO) 97-103 MG Take 1 tablet by mouth 2 (two) times daily. 180 tablet 3   sotalol (BETAPACE) 80 MG tablet TAKE 1 TABLET(80 MG) BY MOUTH TWICE DAILY 180 tablet 3   No current facility-administered medications on file prior to visit.    Objective:   Vitals:   05/26/22 1531  BP: 112/79  Pulse: 60  Temp: 98 F (36.7 C)  TempSrc: Oral  SpO2: 95%  Weight: 256 lb 9.6 oz (116.4 kg)  Height: 5\' 10"  (1.778 m)    Exam General appearance : Awake, alert, not in any distress. Morbid obese male .Speech Clear. Not toxic looking HEENT: Atraumatic and Normocephalic, pupils equally reactive to light and accomodation Neck: Supple, no JVD. No cervical lymphadenopathy.  Chest: Good air entry bilaterally, no added sounds  CVS: S1 S2 regular, no murmurs.  Abdomen: Bowel sounds present, Non tender and not distended with  no gaurding, rigidity or rebound. Extremities: B/L Lower Ext shows no edema, both legs are warm to touch Neurology: Awake alert, and oriented X 3, Non focal Skin: No Rash  Data Review Lab Results  Component Value Date   HGBA1C 6.3 (A) 05/26/2022   HGBA1C 5.9 (A) 08/28/2021   HGBA1C 6.4 (A) 02/24/2021    Assessment & Plan   1. Prediabetes - HgB A1c 6.3 slightly up from 5.9 9/22  Monitor foods that are high in carbohydrates are the following rice, potatoes, breads, sugars, and pastas.  Reduction in the intake (eating) will assist in lowering your blood sugars.     Patient have been counseled extensively about nutrition and exercise. Other issues discussed during this visit include: low cholesterol diet, weight control and  daily exercise, foot care, annual eye examinations at Ophthalmology, importance of adherence with medications and regular follow-up. We also discussed long term complications of uncontrolled diabetes and hypertension.   No follow-ups on file.  The patient was given clear instructions to go to ER or return to medical center if symptoms don't improve, worsen or new problems develop. The patient verbalized understanding. The patient was told to call to get lab results if they haven't heard anything in the next week.   This note has been created with Education officer, environmental. Any transcriptional errors are unintentional.   Grayce Sessions, NP 05/26/2022, 4:07 PM

## 2022-05-26 NOTE — Patient Instructions (Signed)
Calorie Counting for Weight Loss Calories are units of energy. Your body needs a certain number of calories from food to keep going throughout the day. When you eat or drink more calories than your body needs, your body stores the extra calories mostly as fat. When you eat or drink fewer calories than your body needs, your body burns fat to get the energy it needs. Calorie counting means keeping track of how many calories you eat and drink each day. Calorie counting can be helpful if you need to lose weight. If you eat fewer calories than your body needs, you should lose weight. Ask your health care provider what a healthy weight is for you. For calorie counting to work, you will need to eat the right number of calories each day to lose a healthy amount of weight per week. A dietitian can help you figure out how many calories you need in a day and will suggest ways to reach your calorie goal. A healthy amount of weight to lose each week is usually 1-2 lb (0.5-0.9 kg). This usually means that your daily calorie intake should be reduced by 500-750 calories. Eating 1,200-1,500 calories a day can help most women lose weight. Eating 1,500-1,800 calories a day can help most men lose weight. What do I need to know about calorie counting? Work with your health care provider or dietitian to determine how many calories you should get each day. To meet your daily calorie goal, you will need to: Find out how many calories are in each food that you would like to eat. Try to do this before you eat. Decide how much of the food you plan to eat. Keep a food log. Do this by writing down what you ate and how many calories it had. To successfully lose weight, it is important to balance calorie counting with a healthy lifestyle that includes regular activity. Where do I find calorie information?  The number of calories in a food can be found on a Nutrition Facts label. If a food does not have a Nutrition Facts label, try  to look up the calories online or ask your dietitian for help. Remember that calories are listed per serving. If you choose to have more than one serving of a food, you will have to multiply the calories per serving by the number of servings you plan to eat. For example, the label on a package of bread might say that a serving size is 1 slice and that there are 90 calories in a serving. If you eat 1 slice, you will have eaten 90 calories. If you eat 2 slices, you will have eaten 180 calories. How do I keep a food log? After each time that you eat, record the following in your food log as soon as possible: What you ate. Be sure to include toppings, sauces, and other extras on the food. How much you ate. This can be measured in cups, ounces, or number of items. How many calories were in each food and drink. The total number of calories in the food you ate. Keep your food log near you, such as in a pocket-sized notebook or on an app or website on your mobile phone. Some programs will calculate calories for you and show you how many calories you have left to meet your daily goal. What are some portion-control tips? Know how many calories are in a serving. This will help you know how many servings you can have of a certain   food. Use a measuring cup to measure serving sizes. You could also try weighing out portions on a kitchen scale. With time, you will be able to estimate serving sizes for some foods. Take time to put servings of different foods on your favorite plates or in your favorite bowls and cups so you know what a serving looks like. Try not to eat straight from a food's packaging, such as from a bag or box. Eating straight from the package makes it hard to see how much you are eating and can lead to overeating. Put the amount you would like to eat in a cup or on a plate to make sure you are eating the right portion. Use smaller plates, glasses, and bowls for smaller portions and to prevent  overeating. Try not to multitask. For example, avoid watching TV or using your computer while eating. If it is time to eat, sit down at a table and enjoy your food. This will help you recognize when you are full. It will also help you be more mindful of what and how much you are eating. What are tips for following this plan? Reading food labels Check the calorie count compared with the serving size. The serving size may be smaller than what you are used to eating. Check the source of the calories. Try to choose foods that are high in protein, fiber, and vitamins, and low in saturated fat, trans fat, and sodium. Shopping Read nutrition labels while you shop. This will help you make healthy decisions about which foods to buy. Pay attention to nutrition labels for low-fat or fat-free foods. These foods sometimes have the same number of calories or more calories than the full-fat versions. They also often have added sugar, starch, or salt to make up for flavor that was removed with the fat. Make a grocery list of lower-calorie foods and stick to it. Cooking Try to cook your favorite foods in a healthier way. For example, try baking instead of frying. Use low-fat dairy products. Meal planning Use more fruits and vegetables. One-half of your plate should be fruits and vegetables. Include lean proteins, such as chicken, turkey, and fish. Lifestyle Each week, aim to do one of the following: 150 minutes of moderate exercise, such as walking. 75 minutes of vigorous exercise, such as running. General information Know how many calories are in the foods you eat most often. This will help you calculate calorie counts faster. Find a way of tracking calories that works for you. Get creative. Try different apps or programs if writing down calories does not work for you. What foods should I eat?  Eat nutritious foods. It is better to have a nutritious, high-calorie food, such as an avocado, than a food with  few nutrients, such as a bag of potato chips. Use your calories on foods and drinks that will fill you up and will not leave you hungry soon after eating. Examples of foods that fill you up are nuts and nut butters, vegetables, lean proteins, and high-fiber foods such as whole grains. High-fiber foods are foods with more than 5 g of fiber per serving. Pay attention to calories in drinks. Low-calorie drinks include water and unsweetened drinks. The items listed above may not be a complete list of foods and beverages you can eat. Contact a dietitian for more information. What foods should I limit? Limit foods or drinks that are not good sources of vitamins, minerals, or protein or that are high in unhealthy fats. These   include: Candy. Other sweets. Sodas, specialty coffee drinks, alcohol, and juice. The items listed above may not be a complete list of foods and beverages you should avoid. Contact a dietitian for more information. How do I count calories when eating out? Pay attention to portions. Often, portions are much larger when eating out. Try these tips to keep portions smaller: Consider sharing a meal instead of getting your own. If you get your own meal, eat only half of it. Before you start eating, ask for a container and put half of your meal into it. When available, consider ordering smaller portions from the menu instead of full portions. Pay attention to your food and drink choices. Knowing the way food is cooked and what is included with the meal can help you eat fewer calories. If calories are listed on the menu, choose the lower-calorie options. Choose dishes that include vegetables, fruits, whole grains, low-fat dairy products, and lean proteins. Choose items that are boiled, broiled, grilled, or steamed. Avoid items that are buttered, battered, fried, or served with cream sauce. Items labeled as crispy are usually fried, unless stated otherwise. Choose water, low-fat milk,  unsweetened iced tea, or other drinks without added sugar. If you want an alcoholic beverage, choose a lower-calorie option, such as a glass of wine or light beer. Ask for dressings, sauces, and syrups on the side. These are usually high in calories, so you should limit the amount you eat. If you want a salad, choose a garden salad and ask for grilled meats. Avoid extra toppings such as bacon, cheese, or fried items. Ask for the dressing on the side, or ask for olive oil and vinegar or lemon to use as dressing. Estimate how many servings of a food you are given. Knowing serving sizes will help you be aware of how much food you are eating at restaurants. Where to find more information Centers for Disease Control and Prevention: www.cdc.gov U.S. Department of Agriculture: myplate.gov Summary Calorie counting means keeping track of how many calories you eat and drink each day. If you eat fewer calories than your body needs, you should lose weight. A healthy amount of weight to lose per week is usually 1-2 lb (0.5-0.9 kg). This usually means reducing your daily calorie intake by 500-750 calories. The number of calories in a food can be found on a Nutrition Facts label. If a food does not have a Nutrition Facts label, try to look up the calories online or ask your dietitian for help. Use smaller plates, glasses, and bowls for smaller portions and to prevent overeating. Use your calories on foods and drinks that will fill you up and not leave you hungry shortly after a meal. This information is not intended to replace advice given to you by your health care provider. Make sure you discuss any questions you have with your health care provider. Document Revised: 01/03/2020 Document Reviewed: 01/03/2020 Elsevier Patient Education  2023 Elsevier Inc.  

## 2022-06-07 LAB — COLOGUARD

## 2022-06-29 ENCOUNTER — Other Ambulatory Visit (HOSPITAL_COMMUNITY): Payer: Self-pay

## 2022-06-29 MED ORDER — POTASSIUM CHLORIDE CRYS ER 20 MEQ PO TBCR: EXTENDED_RELEASE_TABLET | ORAL | 3 refills | Status: DC

## 2022-06-30 ENCOUNTER — Other Ambulatory Visit (HOSPITAL_COMMUNITY): Payer: Self-pay

## 2022-06-30 MED ORDER — ISOSORB DINITRATE-HYDRALAZINE 20-37.5 MG PO TABS
1.0000 | ORAL_TABLET | Freq: Two times a day (BID) | ORAL | 4 refills | Status: DC
Start: 2022-06-30 — End: 2022-08-26

## 2022-07-02 IMAGING — CR DG CHEST 2V
1 series · 1 of 1 positions shown · non-contrast
Comparison: Chest x-ray 05/24/2017.

CLINICAL DATA: 60-year-old male status post AICD placement.

EXAM:
CHEST - 2 VIEW

[w chest pa *]
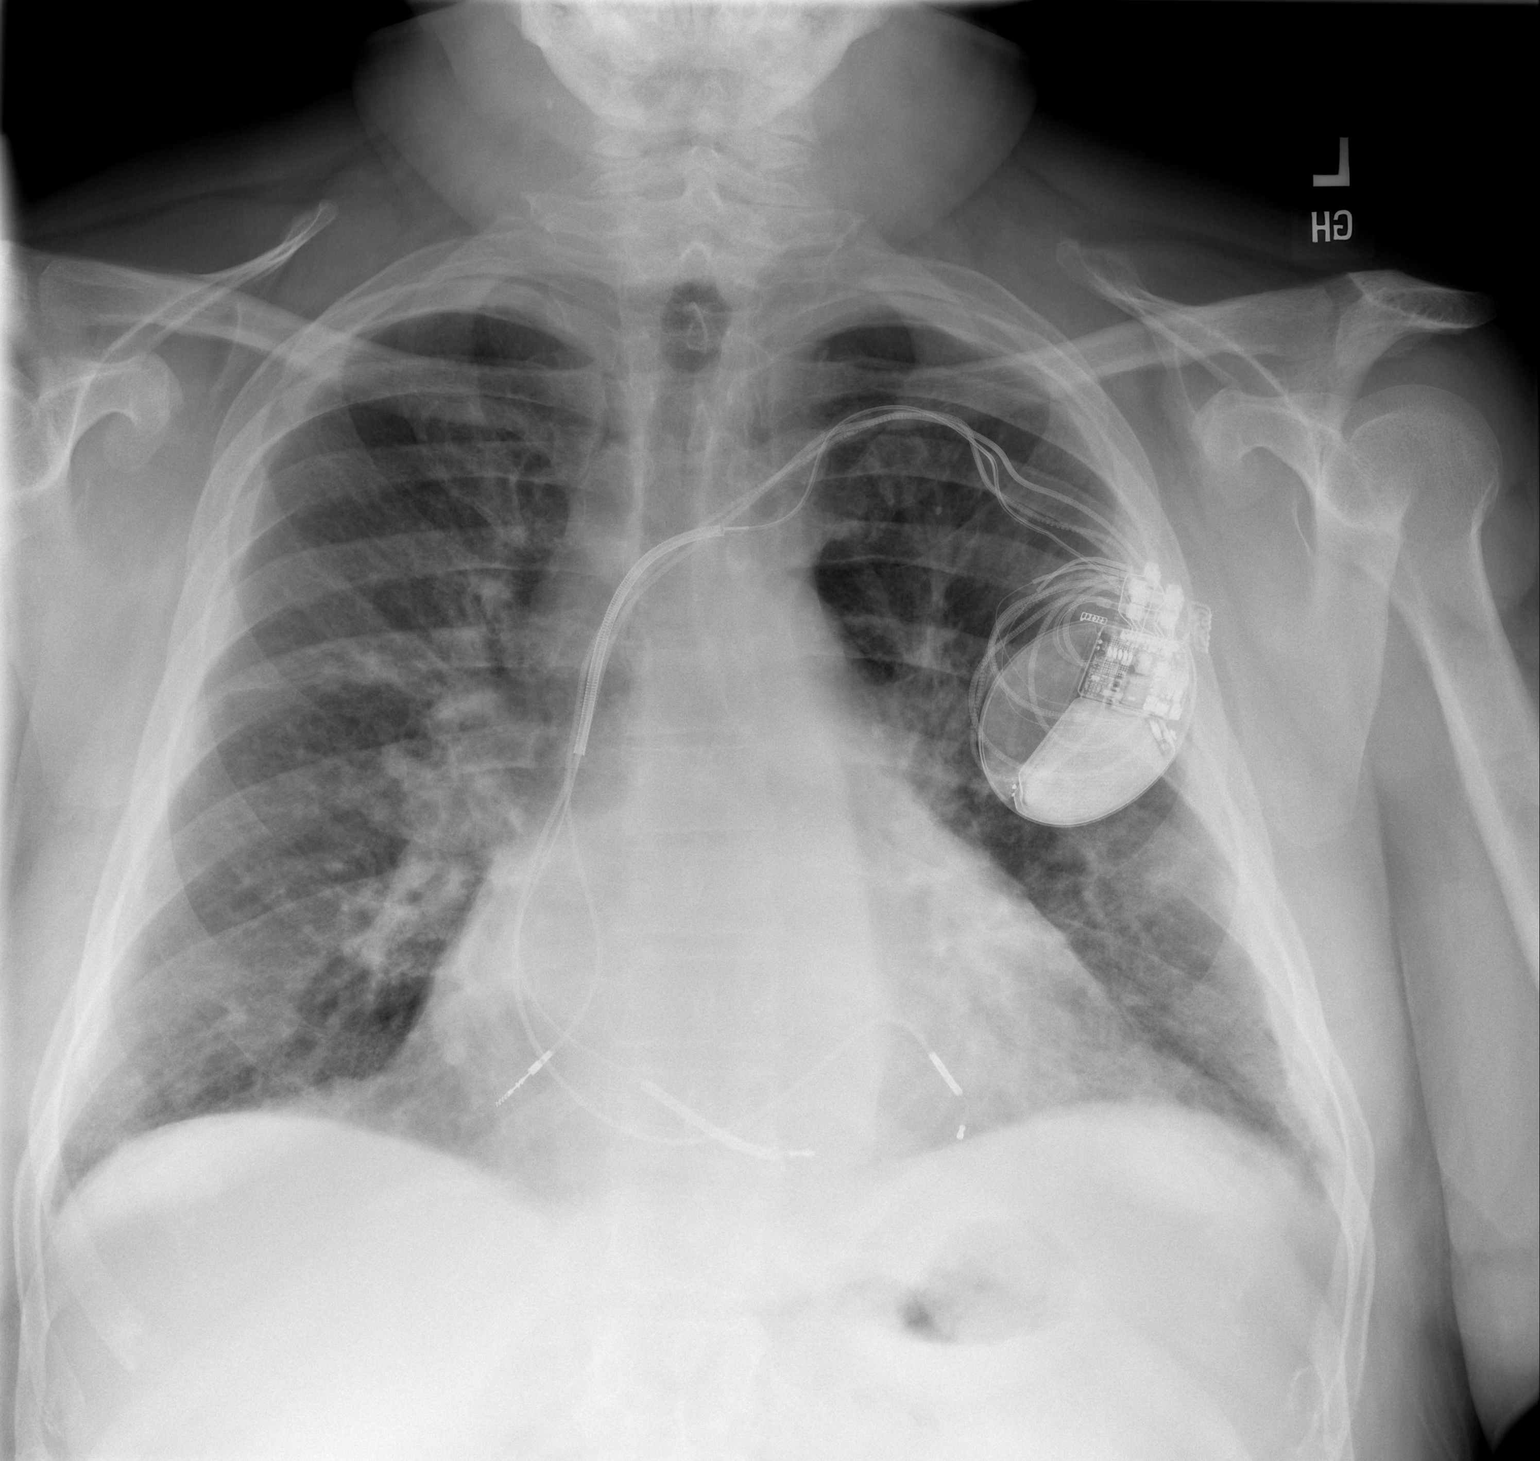

[1 of 1 positions shown; findings below may reference images not displayed]

FINDINGS: There is cephalization of the pulmonary vasculature and slight
indistinctness of the interstitial markings suggestive of mild
pulmonary edema. Mild cardiomegaly. Upper mediastinal contours are
within normal limits. Left-sided biventricular pacemaker/AICD with
lead tips projecting over the expected location of the right atrium,
right ventricle and overlying the left ventricle via the coronary
sinus and coronary veins.
IMPRESSION: 1. The appearance of the chest suggests mild congestive heart
failure, as above.
2. Left-sided biventricular pacemaker/AICD in position, as above.

## 2022-07-26 ENCOUNTER — Other Ambulatory Visit (HOSPITAL_COMMUNITY): Payer: Self-pay | Admitting: *Deleted

## 2022-07-26 ENCOUNTER — Ambulatory Visit (INDEPENDENT_AMBULATORY_CARE_PROVIDER_SITE_OTHER): Payer: Medicaid Other

## 2022-07-26 DIAGNOSIS — I5022 Chronic systolic (congestive) heart failure: Secondary | ICD-10-CM | POA: Diagnosis not present

## 2022-07-26 DIAGNOSIS — I420 Dilated cardiomyopathy: Secondary | ICD-10-CM

## 2022-07-27 ENCOUNTER — Other Ambulatory Visit: Payer: Self-pay | Admitting: Student

## 2022-07-28 LAB — CUP PACEART REMOTE DEVICE CHECK
Battery Remaining Longevity: 114 mo
Battery Remaining Percentage: 100 %
Brady Statistic RA Percent Paced: 31 %
Brady Statistic RV Percent Paced: 99 %
Date Time Interrogation Session: 20230822114800
HighPow Impedance: 47 Ohm
Implantable Lead Implant Date: 20051122
Implantable Lead Implant Date: 20051122
Implantable Lead Implant Date: 20051122
Implantable Lead Location: 753858
Implantable Lead Location: 753859
Implantable Lead Location: 753860
Implantable Lead Model: 158
Implantable Lead Model: 4194
Implantable Lead Model: 5076
Implantable Lead Serial Number: 159458
Implantable Pulse Generator Implant Date: 20211101
Lead Channel Impedance Value: 471 Ohm
Lead Channel Impedance Value: 537 Ohm
Lead Channel Impedance Value: 936 Ohm
Lead Channel Setting Pacing Amplitude: 2 V
Lead Channel Setting Pacing Amplitude: 2.4 V
Lead Channel Setting Pacing Amplitude: 2.8 V
Lead Channel Setting Pacing Pulse Width: 0.4 ms
Lead Channel Setting Pacing Pulse Width: 1 ms
Lead Channel Setting Sensing Sensitivity: 0.5 mV
Lead Channel Setting Sensing Sensitivity: 1 mV
Pulse Gen Serial Number: 147594

## 2022-07-30 ENCOUNTER — Other Ambulatory Visit: Payer: Self-pay | Admitting: Student

## 2022-07-30 MED ORDER — CARVEDILOL 25 MG PO TABS: 25.0000 mg | ORAL_TABLET | Freq: Two times a day (BID) | ORAL | 3 refills | Status: DC

## 2022-08-22 NOTE — Progress Notes (Signed)
Remote ICD transmission.   

## 2022-08-25 ENCOUNTER — Telehealth (HOSPITAL_COMMUNITY): Payer: Self-pay

## 2022-08-25 NOTE — Progress Notes (Signed)
Cardiology: Dr. Caryl Comes HF Cardiology: Dr. Aundra Dubin  61 y.o. with history of nonischemic cardiomyopathy with a long history of cardiomyopathy.  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  Echo in 7/18 showed EF 20-25% with diffuse hypokinesis and dilated RV.  He has a strong family history of CHF (father, sister, half-brother).  Sleep study in 12/18 with severe central sleep apnea, now on Bipap.  Echo in 8/19 showed EF 20-25%, moderate MR, mildly decreased RV systolic function.    Echo in 11/20 showed EF 25-30%, mildly decreased RV systolic function.  Echo in 3/22 showed EF 25-30%, diffuse hypokinesis, mildly decreased RV systolic function.   Echo 05/23 showed EF 30-35%, diffuse hypokinesis, mildly decreased RV systolic function.   Here today for 4 month follow-up. He has been doing well. Works part-time at [employer]. Denies dyspnea, CP, orthopnea, PND or lower extremity edema. Weight down 8 lb from last visit. He ran out of bidil a month ago and had trouble getting it from the pharmacy. Taking all other meds as prescribed.  No ETOH use. Smokes 5 cigarettes a day. Stopped taking Wellbutrin. Believes he can quit on his own when ready.   ECG (personally reviewed): A sensed, V paced 61 bpm, Qtc 523 ms  [employer] device interrogation: HeartLogic 0, no VT or AF, activity level 2.9 hrs/day  Labs (8/18): K 4.3, creatinine 0.88 Labs (08/17/2017) K 4.1 Creatinine 1.17, BNP 114 Labs (11/18): K 3.9, creatinine 1.14 Labs (4/19): K 4.5, creatinine 1.06 Labs (6/19): K 4, creatinine 1.09 Labs (9/19): K 3.9, creatinine 1.36 Labs (11/19): K 4.4, creatinine 1.27 Labs (3/20): K 4.5, creatinine 1.31 Labs (8/20): K 4.4, creatinine 1.34 Labs (12/20): K 4.8, creatinine 1.53 Labs (7/21): K 4, creatinine 1.46 Labs (9/21): LDL 56, TGs 337 Labs (10/21): K 3.9, creatinine 1.35 Labs (3/22): LDL 30 Labs (7/22): K 4.1, creatinine 1.23 Labs (11/22): BNP 83, K 4.3, creatinine 1.07 Labs  (05/23): Scr 1.20, K 3.8, BNP 145, Hgb A1c 6.3%  PMH: 1. Central sleep apnea: Uses Bipap.  2. LBBB: s/p [employer] CRT-D.   3. 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  4. History of atrial tachycardia 5. Active smoker 6. Chronic systolic CHF: nonischemic cardiomyopathy.  Known since prior to 2005.  [place] CRT-D.  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  - Echo (7/18): EF 20-25%, severe dilated LV, diffuse hypokinesis, severe RV dilation with low normal systolic function, moderate TR, PASP 52 mmHg.  - Gynecomastia with spironolactone.  - Echo (8/19): EF 20-25%, moderate MR, mildly decreased RV systolic function.  - CPX (9/19): peak VO2 14.4, VE/VCO2 slope 40, RER 1.08.  Moderate functional limitation due to HF and body habitus.  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  - Echo (11/20): EF 25-30%, mildly decreased RV systolic function.  - Echo (3/22): EF 25-30%, diffuse hypokinesis, mildly decreased RV systolic function.  - Echo (5/23): EF 30-35%, diffuse hypokinesis, mildly decreased RV systolic function.  SH: Smokes 4 cigs/day, rare ETOH, occasional marijuana.   Lives with friend in [place].   FH: Father with cardiomyopathy, 1/2 brother died from CHF, sister with CHF.   ROS: All systems reviewed and negative except as per HPI.   Current Outpatient Medications  Medication Sig Dispense Refill   aspirin 81 MG tablet Take 81 mg by mouth daily.     atorvastatin (LIPITOR) 40 MG tablet Take 1 tablet (40 mg total) by mouth daily. 90 tablet 3   carvedilol (COREG) 25  MG tablet Take 1 tablet (25 mg total) by mouth 2 (two) times daily with a meal. 180 tablet 3   cetirizine (ZYRTEC) 10 MG chewable tablet Chew 10 mg by mouth daily.     eplerenone (INSPRA) 50 MG tablet TAKE 1 TABLET(50 MG) BY MOUTH DAILY 30 tablet 5   FARXIGA 10 MG TABS tablet TAKE 1 TABLET(10 MG) BY MOUTH DAILY BEFORE AND BREAKFAST 90 tablet 3   furosemide (LASIX) 40 MG tablet Take 1 tablet (40 mg  total) by mouth every morning AND 0.5 tablets (20 mg total) every evening. 135 tablet 3   mometasone (NASONEX) 50 MCG/ACT nasal spray Place 2 sprays into the nose as needed.     potassium chloride SA (KLOR-CON M) 20 MEQ tablet TAKE 1 TABLET(20 MEQ) BY MOUTH DAILY 90 tablet 3   sacubitril-valsartan (ENTRESTO) 97-103 MG Take 1 tablet by mouth 2 (two) times daily. 180 tablet 3   sotalol (BETAPACE) 80 MG tablet TAKE 1 TABLET(80 MG) BY MOUTH TWICE DAILY 180 tablet 3   No current facility-administered medications for this encounter.   BP 108/70   Pulse 62   Wt 114.1 kg (251 lb 9.6 oz)   SpO2 98%   BMI 36.10 kg/m   General:  Well appearing. Ambulated into clinic HEENT: normal Neck: supple. no JVD. Carotids 2+ bilat; no bruits.  Cor: PMI nondisplaced. Regular rate & rhythm. No rubs, gallops or murmurs. Lungs: clear Abdomen: soft, nontender, nondistended.  Extremities: no cyanosis, clubbing, rash, edema Neuro: alert & orientedx3, cranial nerves grossly intact. moves all 4 extremities w/o difficulty. Affect pleasant   Assessment/Plan: 1. Chronic systolic CHF: Nonischemic cardiomyopathy x years.  [employer] CRT-D device.  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  Echo in 8/19 with EF 20-25%, echo in 11/20 showed EF 25-30%, echo 05/23 showed EF 30-35%.  CPX in 9/19 showed moderate functional limitation from heart failure.  He saw Dr. 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 NYHA II. Volume looks good on exam. BP runs low at times but no orthostatic symptoms and normal creatinine.  - Continue Lasix 40 qam/20 qpm.  BMET/BNP today.  - Continue dapagliflozin 10 mg daily.   - Continue Entresto 97/103 bid.  - Continue Coreg 25 mg bid.  - Intolerant spiro due to gynecomastia.  Continue eplerenone 50 mg daily.    - Has been out of bidil for a month. BP tends to average in 100s. Will hold off on restarting. -  Taking sotalol b/c of high DFTs.  ECG showed paced QTc 523 msec (keep paced QTc < 550 msec).  2. Smoking: Discussed smoking cessation again.   - He has stopped bupropion - He plans to try to quit cold [place] when ready 3. Atrial Tachycardia: He is on sotalol.  4. Central sleep apnea: Continue Bipap.    Followup: 4 months with APP, plan to repeat echo around 05/24  [facility], Lonzo Saulter N 08/26/2022

## 2022-08-25 NOTE — Telephone Encounter (Signed)
Called to confirm/remind patient of their appointment at the [facility] on 08/25/22.   Patient reminded to bring all medications and/or complete list.  Confirmed patient has transportation. Gave directions, instructed to utilize valet parking.  Confirmed appointment prior to ending call.   

## 2022-08-26 ENCOUNTER — Ambulatory Visit (HOSPITAL_COMMUNITY)
Admission: RE | Admit: 2022-08-26 | Discharge: 2022-08-26 | Disposition: A | Discharge status: Home / Self Care | Payer: Medicaid Other | Source: Ambulatory Visit | Attending: Physician Assistant | Admitting: Physician Assistant

## 2022-08-26 VITALS — BP 108/70 | HR 62 | Wt 251.6 lb

## 2022-08-26 DIAGNOSIS — Z8249 Family history of ischemic heart disease and other diseases of the circulatory system: Secondary | ICD-10-CM | POA: Insufficient documentation

## 2022-08-26 DIAGNOSIS — Z7984 Long term (current) use of oral hypoglycemic drugs: Secondary | ICD-10-CM | POA: Insufficient documentation

## 2022-08-26 DIAGNOSIS — Z716 Tobacco abuse counseling: Secondary | ICD-10-CM | POA: Insufficient documentation

## 2022-08-26 DIAGNOSIS — I5022 Chronic systolic (congestive) heart failure: Secondary | ICD-10-CM | POA: Diagnosis present

## 2022-08-26 DIAGNOSIS — I471 Supraventricular tachycardia: Secondary | ICD-10-CM | POA: Diagnosis not present

## 2022-08-26 DIAGNOSIS — F17210 Nicotine dependence, cigarettes, uncomplicated: Secondary | ICD-10-CM | POA: Insufficient documentation

## 2022-08-26 DIAGNOSIS — F17200 Nicotine dependence, unspecified, uncomplicated: Secondary | ICD-10-CM | POA: Diagnosis not present

## 2022-08-26 DIAGNOSIS — Z79899 Other long term (current) drug therapy: Secondary | ICD-10-CM | POA: Insufficient documentation

## 2022-08-26 DIAGNOSIS — Z7901 Long term (current) use of anticoagulants: Secondary | ICD-10-CM | POA: Diagnosis not present

## 2022-08-26 DIAGNOSIS — G4731 Primary central sleep apnea: Secondary | ICD-10-CM | POA: Diagnosis not present

## 2022-08-26 DIAGNOSIS — I428 Other cardiomyopathies: Secondary | ICD-10-CM | POA: Diagnosis not present

## 2022-08-26 LAB — BASIC METABOLIC PANEL
Anion gap: 8 (ref 5–15)
BUN: 20 mg/dL (ref 6–20)
CO2: 26 mmol/L (ref 22–32)
Calcium: 9.4 mg/dL (ref 8.9–10.3)
Chloride: 103 mmol/L (ref 98–111)
Creatinine, Ser: 1.33 mg/dL — ABNORMAL HIGH (ref 0.61–1.24)
GFR, Estimated: >60 mL/min (ref >60)
Glucose, Bld: 102 mg/dL — ABNORMAL HIGH (ref 70–99)
Potassium: 3.9 mmol/L (ref 3.5–5.1)
Sodium: 137 mmol/L (ref 135–145)

## 2022-08-26 LAB — BRAIN NATRIURETIC PEPTIDE: B Natriuretic Peptide: 96.5 pg/mL (ref 0.0–100.0)

## 2022-08-26 NOTE — Patient Instructions (Signed)
STOP Wellbutrin STOP Bidil  Labs today We will only contact you if something comes back abnormal or we need to make some changes. Otherwise no news is good news!  Your physician recommends that you schedule a follow-up appointment in: 4 months  in the Advanced Practition[facility] (PA/NP) Clinic     Do the following things EVERYDAY: Weigh yourself in the morning before breakfast. Write it down and keep it in a log. Take your medicines as prescribed Eat low salt foods--Limit salt (sodium) to 2000 mg per day.  Stay as active as you can everyday Limit all fluids for the day to less than 2 liters   At the [facility], you and your health needs are our priority. As part of our continuing mission to provide you with exceptional heart care, we have created designated Provider Care Teams. These Care Teams include your primary Cardiologist (physician) and Advanced Practice Providers (APPs- Physician Assistants and Nurse Practitioners) who all work together to provide you with the care you need, when you need it.   You may see any of the following providers on your designated Care Team at your next follow up: Dr Glori Bickers Dr Loralie Champagne Dr. Roxana Hires, NP Lyda Jester, [place] [facility] [place], [place] Forestine Na, NP Audry Riles, PharmD   Please be sure to bring in all your medications bottles to every appointment.   If you have any questions or concerns before your next appointment please send [place] a message through [place] or call our office at [phone].    TO LEAVE A MESSAGE FOR THE NURSE SELECT OPTION 2, PLEASE LEAVE A MESSAGE INCLUDING: YOUR NAME DATE OF BIRTH CALL BACK NUMBER REASON FOR CALL**this is important as we prioritize the call backs  YOU WILL RECEIVE A CALL BACK THE SAME DAY AS LONG AS YOU CALL BEFORE 4:00 PM

## 2022-08-28 ENCOUNTER — Other Ambulatory Visit (HOSPITAL_COMMUNITY): Payer: Self-pay | Admitting: Cardiology

## 2022-09-13 ENCOUNTER — Other Ambulatory Visit (INDEPENDENT_AMBULATORY_CARE_PROVIDER_SITE_OTHER): Payer: Self-pay | Admitting: Primary Care

## 2022-09-26 NOTE — Progress Notes (Deleted)
Electrophysiology Office Note Date: 09/26/2022  ID:  Spencer Robertson, Spencer Robertson July 10, 1961, MRN [id number]  PCP: Grayce Sessions, NP Primary Cardiologist: None Electrophysiologist: Sherryl Manges, MD   CC: Routine ICD follow-up  Spencer Robertson is a 61 y.o. male seen today for Sherryl Manges, MD for routine electrophysiology followup. Since last being seen in our clinic the patient reports doing ***.  he denies chest pain, palpitations, dyspnea, PND, orthopnea, nausea, vomiting, dizziness, syncope, edema, weight gain, or early satiety.     He has not had ICD shocks.   Device History:  BSCi CRT-D, gen change 08/05/10 and 09/2020, originally implanted 2005, Dr. Graciela Husbands AAD: sotalol added to improve DFT's  Past Medical History:  Diagnosis Date   CHF (congestive heart failure) (HCC)    Hyperlipidemia    Hypertension    Mitral regurgitation    Nonischemic cardiomyopathy (HCC)    Obesity    Obesity (BMI 30-39.9) 09/01/2015   OSA (obstructive sleep apnea)    severe   S/P implantation of automatic cardioverter/defibrillator (AICD)    Guidant Contact [id number]   Tobacco abuse    Past Surgical History:  Procedure Laterality Date   BIV ICD GENERATOR CHANGEOUT N/A 10/06/2020   Procedure: BIV ICD GENERATOR CHANGEOUT;  Surgeon: Duke Salvia, MD;  Location: [facility] INVASIVE CV LAB;  Service: Cardiovascular;  Laterality: N/A;   CARDIAC DEFIBRILLATOR PLACEMENT     Guidant Contak H177 device     Current Outpatient Medications  Medication Sig Dispense Refill   aspirin 81 MG tablet Take 81 mg by mouth daily.     atorvastatin (LIPITOR) 40 MG tablet TAKE 1 TABLET(40 MG) BY MOUTH DAILY 90 tablet 3   carvedilol (COREG) 25 MG tablet Take 1 tablet (25 mg total) by mouth 2 (two) times daily with a meal. 180 tablet 3   cetirizine (ZYRTEC) 10 MG chewable tablet Chew 10 mg by mouth daily.     eplerenone (INSPRA) 50 MG tablet TAKE 1 TABLET(50 MG) BY MOUTH DAILY 30 tablet 5   FARXIGA 10 MG TABS tablet TAKE 1  TABLET(10 MG) BY MOUTH DAILY BEFORE AND BREAKFAST 90 tablet 3   furosemide (LASIX) 40 MG tablet Take 1 tablet (40 mg total) by mouth every morning AND 0.5 tablets (20 mg total) every evening. 135 tablet 3   mometasone (NASONEX) 50 MCG/ACT nasal spray Place 2 sprays into the nose as needed.     potassium chloride SA (KLOR-CON M) 20 MEQ tablet TAKE 1 TABLET(20 MEQ) BY MOUTH DAILY 90 tablet 3   sacubitril-valsartan (ENTRESTO) 97-103 MG Take 1 tablet by mouth 2 (two) times daily. 180 tablet 3   sotalol (BETAPACE) 80 MG tablet TAKE 1 TABLET(80 MG) BY MOUTH TWICE DAILY 180 tablet 3   No current facility-administered medications for this visit.    Allergies:   Spironolactone   Social History: Social History   Socioeconomic History   Marital status: Single    Spouse name: Not on file   Number of children: Not on file   Years of education: Not on file   Highest education level: Not on file  Occupational History   Occupation: disabled  Tobacco Use   Smoking status: Some Days    Packs/day: 0.25    Years: 20.00    Total pack years: 5.00    Types: Cigarettes   Smokeless tobacco: Never   Tobacco comments:    2 packs per week 07/17/2021  Vaping Use   Vaping Use: Never used  Substance  and Sexual Activity   Alcohol use: Yes    Comment: socially   Drug use: Yes    Types: Marijuana    Comment: occasional   Sexual activity: Not on file  Other Topics Concern   Not on file  Social History Narrative   Not on file   Social Determinants of Health   Financial Resource Strain: Not on file  Food Insecurity: Not on file  Transportation Needs: Not on file  Physical Activity: Not on file  Stress: Not on file  Social Connections: Not on file  Intimate Partner Violence: Not on file    Family History: Family History  Problem Relation Age of Onset   Cardiomyopathy Father    Hypertension Other    Heart disease Sister    Heart disease Brother     Review of Systems: All other systems  reviewed and are otherwise negative except as noted above.   Physical Exam: There were no vitals filed for this visit.   GEN- The patient is well appearing, alert and oriented x 3 today.   HEENT: normocephalic, atraumatic; sclera clear, conjunctiva pink; hearing intact; oropharynx clear; neck supple, no JVP Lymph- no cervical lymphadenopathy Lungs- Clear to ausculation bilaterally, normal work of breathing.  No wheezes, rales, rhonchi Heart- {Blank single:19197::"Regular","Irregularly irregular"}  rate and rhythm, no murmurs, rubs or gallops, PMI not laterally displaced GI- soft, non-tender, non-distended, bowel sounds present, no hepatosplenomegaly Extremities- no clubbing or cyanosis. {EDEMA PTWSF:68127} peripheral edema; DP/PT/radial pulses 2+ bilaterally MS- no significant deformity or atrophy Skin- warm and dry, no rash or lesion; ICD pocket well healed Psych- euthymic mood, full affect Neuro- strength and sensation are intact  ICD interrogation- reviewed in detail today,  See PACEART report  EKG:  EKG is not ordered today. EKG 08/26/22 showed AS-VP rhythm   Recent Labs: 08/26/2022: B Natriuretic Peptide 96.5; BUN 20; Creatinine, Ser 1.33; Potassium 3.9; Sodium 137   Wt Readings from Last 3 Encounters:  08/26/22 251 lb 9.6 oz (114.1 kg)  05/26/22 256 lb 9.6 oz (116.4 kg)  04/27/22 259 lb (117.5 kg)     Other studies Reviewed: Additional studies/ records that were reviewed today include: Previous EP office notes.   Assessment and Plan:  1.  Chronic systolic dysfunction s/p Chemical engineer CRT-D  euvolemic today Stable on an appropriate medical regimen Normal ICD function See Pace Art report No changes today  2. HTN Stable on current regimen   3. OSA  Encouraged nightly CPAP   Current medicines are reviewed at length with the patient today.   =  Labs/ tests ordered today include: *** No orders of the defined types were placed in this  encounter.    Disposition:   Follow up with {EPMDS:28135} {Blank single:19197::"in 2 weeks","in 4 weeks","in 3 months","in 6 months","in 12 months","as usual post gen change"}    Signed, Shirley Friar, PA-C  09/26/2022 2:26 PM  [place] [address] [phone] (office) [phone] (fax)

## 2022-09-27 ENCOUNTER — Other Ambulatory Visit: Payer: Self-pay | Admitting: Student

## 2022-09-27 LAB — COLOGUARD: COLOGUARD: NEGATIVE

## 2022-09-29 ENCOUNTER — Ambulatory Visit: Payer: Medicaid Other | Admitting: Student

## 2022-09-29 DIAGNOSIS — I5022 Chronic systolic (congestive) heart failure: Secondary | ICD-10-CM

## 2022-09-29 DIAGNOSIS — G4733 Obstructive sleep apnea (adult) (pediatric): Secondary | ICD-10-CM

## 2022-09-29 DIAGNOSIS — I10 Essential (primary) hypertension: Secondary | ICD-10-CM

## 2022-10-22 ENCOUNTER — Ambulatory Visit: Payer: Medicaid Other | Attending: Student | Admitting: Physician Assistant

## 2022-10-22 NOTE — Progress Notes (Deleted)
Cardiology Office Note Date:  10/22/2022  Patient ID:  Spencer Robertson, Spencer Robertson 1961/05/01, MRN [id number] PCP:  Grayce Sessions, NP  Cardiologist:  Dr. Shirlee Latch Electrophysiologist: Dr. Graciela Husbands  ***refresh   Chief Complaint: *** 6 mo  History of Present Illness: Spencer Robertson is a 61 y.o. male with history of OSA (severe, w/BIPAP), chronic CHF (systolic), NICM (possibly familial CM), smoker, LBBB, ATach  Gynecomastia w/spironolactone > tolerates eplerenone  He saw Mardelle Matte 09/16/21, , doing well, denied symptoms, this was hi 1st visit post gen change a year prior, noted a gradual reduction in impedance, but a large change in threshold on the LV lead (implanted ***), planned for CXR Planned 6 mo in clinic visit with MD  CXR was stable, rec for in clinic with industry/device clinic to revisit LV lead/alternative vectors.  Device clinic 09/24/21,  ECG completed with vector testing, no changes made due to prolonged QRS duration with alternate LV vectors- Presenting LV tip to LV ring- QRS duration , threshold 2.1V @ 52ms;  LV tip to RV- QRS duration 172 ms, threshold 1.1V @ 81ms;  LV tip to Can- QRS duration , threshold 1.1v@1ms .  Device remains programmed LV tip to Ring with output set at 2.8v@1ms     Saw the HF team 08/26/22, working, had run out of his BiDil.  Doing well, volume stable, paced QTC 523. Borderline BPs and BiDil not resumed. Discussed smoking cessation.  Planned for 4 mo f/u  *** LV lead? *** device only *** volume *** sotalol EKG/labs   Device information BSCi CRT-D, implanted 10/27/2004, Gen changes: 08/05/2010, 10/06/2020 On Sotalol w/known high DFT  He had MDT RA/LV leads  Past Medical History:  Diagnosis Date   CHF (congestive heart failure) (HCC)    Hyperlipidemia    Hypertension    Mitral regurgitation    Nonischemic cardiomyopathy (HCC)    Obesity    Obesity (BMI 30-39.9) 09/01/2015   OSA (obstructive sleep apnea)    severe   S/P implantation  of automatic cardioverter/defibrillator (AICD)    Guidant Contact H177   Tobacco abuse     Past Surgical History:  Procedure Laterality Date   BIV ICD GENERATOR CHANGEOUT N/A 10/06/2020   Procedure: BIV ICD GENERATOR CHANGEOUT;  Surgeon: Duke Salvia, MD;  Location: [facility] INVASIVE CV LAB;  Service: Cardiovascular;  Laterality: N/A;   CARDIAC DEFIBRILLATOR PLACEMENT     Guidant Contak H177 device     Current Outpatient Medications  Medication Sig Dispense Refill   aspirin 81 MG tablet Take 81 mg by mouth daily.     atorvastatin (LIPITOR) 40 MG tablet TAKE 1 TABLET(40 MG) BY MOUTH DAILY 90 tablet 3   carvedilol (COREG) 25 MG tablet Take 1 tablet (25 mg total) by mouth 2 (two) times daily with a meal. 180 tablet 3   cetirizine (ZYRTEC) 10 MG chewable tablet Chew 10 mg by mouth daily.     eplerenone (INSPRA) 50 MG tablet TAKE 1 TABLET(50 MG) BY MOUTH DAILY 30 tablet 5   FARXIGA 10 MG TABS tablet TAKE 1 TABLET(10 MG) BY MOUTH DAILY BEFORE AND BREAKFAST 90 tablet 3   furosemide (LASIX) 40 MG tablet Take 1 tablet (40 mg total) by mouth every morning AND 0.5 tablets (20 mg total) every evening. 135 tablet 3   mometasone (NASONEX) 50 MCG/ACT nasal spray Place 2 sprays into the nose as needed.     potassium chloride SA (KLOR-CON M) 20 MEQ tablet TAKE 1 TABLET(20 MEQ) BY  MOUTH DAILY 90 tablet 3   sacubitril-valsartan (ENTRESTO) 97-103 MG Take 1 tablet by mouth 2 (two) times daily. 180 tablet 3   sotalol (BETAPACE) 80 MG tablet TAKE 1 TABLET(80 MG) BY MOUTH TWICE DAILY 180 tablet 3   No current facility-administered medications for this visit.    Allergies:   Spironolactone   Social History:  The patient  reports that he has been smoking cigarettes. He has a 5.00 pack-year smoking history. He has never used smokeless tobacco. He reports current alcohol use. He reports current drug use. Drug: Marijuana.   Family History:  The patient's family history includes Cardiomyopathy in his father; Heart  disease in his brother and sister; Hypertension in an other family member.  ROS:  Please see the history of present illness.    All other systems are reviewed and otherwise negative.   PHYSICAL EXAM:  VS:  There were no vitals taken for this visit. BMI: There is no height or weight on file to calculate BMI. Well nourished, well developed, in no acute distress HEENT: normocephalic, atraumatic Neck: no JVD, carotid bruits or masses Cardiac:  *** RRR; no significant murmurs, no rubs, or gallops Lungs:  *** CTA b/l, no wheezing, rhonchi or rales Abd: soft, nontender MS: no deformity or *** atrophy Ext: *** no edema Skin: warm and dry, no rash Neuro:  No gross deficits appreciated Psych: euthymic mood, full affect  *** ICD site is stable, no tethering or discomfort   EKG:   done today and reviewed by myself ***  Device interrogation done today and reviewed by myself:  ***  04/27/22: TTE 1. Left ventricular ejection fraction, by estimation, is 30 to 35%. The  left ventricle has moderately decreased function. The left ventricle  demonstrates global hypokinesis. The left ventricular internal cavity size  was moderately dilated. Left  ventricular diastolic parameters are consistent with Grade I diastolic  dysfunction (impaired relaxation).   2. Right ventricular systolic function is mildly reduced. The right  ventricular size is normal. Tricuspid regurgitation signal is inadequate  for assessing PA pressure.   3. The mitral valve is normal in structure. Mild mitral valve  regurgitation. No evidence of mitral stenosis.   4. The aortic valve is tricuspid. Aortic valve regurgitation is not  visualized. No aortic stenosis is present.   5. The inferior vena cava is normal in size with greater than 50%  respiratory variability, suggesting right atrial pressure of 3 mmHg.   Recent Labs: 08/26/2022: B Natriuretic Peptide 96.5; BUN 20; Creatinine, Ser 1.33; Potassium 3.9; Sodium 137  No  results found for requested labs within last 365 days.   CrCl cannot be calculated (Patient's most recent lab result is older than the maximum 21 days allowed.).   Wt Readings from Last 3 Encounters:  08/26/22 251 lb 9.6 oz (114.1 kg)  05/26/22 256 lb 9.6 oz (116.4 kg)  04/27/22 259 lb (117.5 kg)     Other studies reviewed: Additional studies/records reviewed today include: summarized above  ASSESSMENT AND PLAN:  CRT-D NICM Chronic CHF  Disposition: F/u with ***  Current medicines are reviewed at length with the patient today.  The patient did not have any concerns regarding medicines.  Norma Fredrickson, PA-C 10/22/2022 6:00 AM     [facility] [address] [place] [address] [phone] (office)  [phone] (fax)

## 2022-10-25 ENCOUNTER — Ambulatory Visit (INDEPENDENT_AMBULATORY_CARE_PROVIDER_SITE_OTHER): Payer: Medicaid Other

## 2022-10-25 DIAGNOSIS — I428 Other cardiomyopathies: Secondary | ICD-10-CM

## 2022-10-25 DIAGNOSIS — I5022 Chronic systolic (congestive) heart failure: Secondary | ICD-10-CM

## 2022-10-29 LAB — CUP PACEART REMOTE DEVICE CHECK
Battery Remaining Longevity: 108 mo
Battery Remaining Percentage: 100 %
Brady Statistic RA Percent Paced: 31 %
Brady Statistic RV Percent Paced: 99 %
Date Time Interrogation Session: 20231123154900
HighPow Impedance: 55 Ohm
Implantable Lead Connection Status: 753985
Implantable Lead Connection Status: 753985
Implantable Lead Connection Status: 753985
Implantable Lead Implant Date: 20051122
Implantable Lead Implant Date: 20051122
Implantable Lead Implant Date: 20051122
Implantable Lead Location: 753858
Implantable Lead Location: 753859
Implantable Lead Location: 753860
Implantable Lead Model: 158
Implantable Lead Model: 4194
Implantable Lead Model: 5076
Implantable Lead Serial Number: 159458
Implantable Pulse Generator Implant Date: 20211101
Lead Channel Impedance Value: 475 Ohm
Lead Channel Impedance Value: 552 Ohm
Lead Channel Impedance Value: 877 Ohm
Lead Channel Setting Pacing Amplitude: 2 V
Lead Channel Setting Pacing Amplitude: 2.4 V
Lead Channel Setting Pacing Amplitude: 2.8 V
Lead Channel Setting Pacing Pulse Width: 0.4 ms
Lead Channel Setting Pacing Pulse Width: 1 ms
Lead Channel Setting Sensing Sensitivity: 0.5 mV
Lead Channel Setting Sensing Sensitivity: 1 mV
Pulse Gen Serial Number: 147594

## 2022-12-02 ENCOUNTER — Ambulatory Visit (INDEPENDENT_AMBULATORY_CARE_PROVIDER_SITE_OTHER): Payer: Medicaid Other | Admitting: Primary Care

## 2022-12-08 NOTE — Progress Notes (Signed)
Remote ICD transmission.   

## 2022-12-27 ENCOUNTER — Ambulatory Visit (HOSPITAL_COMMUNITY)
Admission: RE | Admit: 2022-12-27 | Discharge: 2022-12-27 | Disposition: A | Discharge status: Home / Self Care | Payer: Medicaid Other | Source: Ambulatory Visit | Attending: Physician Assistant | Admitting: Physician Assistant

## 2022-12-27 ENCOUNTER — Ambulatory Visit (INDEPENDENT_AMBULATORY_CARE_PROVIDER_SITE_OTHER): Payer: Medicaid Other | Admitting: Primary Care

## 2022-12-27 ENCOUNTER — Encounter (HOSPITAL_COMMUNITY): Payer: Self-pay

## 2022-12-27 VITALS — BP 100/60 | HR 60 | Wt 248.2 lb

## 2022-12-27 DIAGNOSIS — Z79899 Other long term (current) drug therapy: Secondary | ICD-10-CM | POA: Diagnosis not present

## 2022-12-27 DIAGNOSIS — I5022 Chronic systolic (congestive) heart failure: Secondary | ICD-10-CM | POA: Diagnosis present

## 2022-12-27 DIAGNOSIS — G4731 Primary central sleep apnea: Secondary | ICD-10-CM | POA: Diagnosis not present

## 2022-12-27 DIAGNOSIS — I428 Other cardiomyopathies: Secondary | ICD-10-CM | POA: Diagnosis not present

## 2022-12-27 DIAGNOSIS — Z8249 Family history of ischemic heart disease and other diseases of the circulatory system: Secondary | ICD-10-CM | POA: Diagnosis not present

## 2022-12-27 DIAGNOSIS — I959 Hypotension, unspecified: Secondary | ICD-10-CM | POA: Insufficient documentation

## 2022-12-27 DIAGNOSIS — F17200 Nicotine dependence, unspecified, uncomplicated: Secondary | ICD-10-CM

## 2022-12-27 DIAGNOSIS — I4719 Other supraventricular tachycardia: Secondary | ICD-10-CM | POA: Insufficient documentation

## 2022-12-27 DIAGNOSIS — N62 Hypertrophy of breast: Secondary | ICD-10-CM | POA: Insufficient documentation

## 2022-12-27 LAB — BASIC METABOLIC PANEL
Anion gap: 10 (ref 5–15)
BUN: 19 mg/dL (ref 6–20)
CO2: 22 mmol/L (ref 22–32)
Calcium: 9 mg/dL (ref 8.9–10.3)
Chloride: 104 mmol/L (ref 98–111)
Creatinine, Ser: 1.22 mg/dL (ref 0.61–1.24)
GFR, Estimated: >60 mL/min (ref >60)
Glucose, Bld: 93 mg/dL (ref 70–99)
Potassium: 3.8 mmol/L (ref 3.5–5.1)
Sodium: 136 mmol/L (ref 135–145)

## 2022-12-27 LAB — BRAIN NATRIURETIC PEPTIDE: B Natriuretic Peptide: 117.7 pg/mL — ABNORMAL HIGH (ref 0.0–100.0)

## 2022-12-27 NOTE — Patient Instructions (Signed)
Thank you for coming in today  Labs were done today, if any labs are abnormal the clinic will call you No news is good news  Your physician recommends that you schedule a follow-up appointment in:  4 month with Dr. Haroldine Laws with echocardiogram You will receive a reminder letter in the mail a few months in advance. If you don't receive a letter, please call our office to schedule the follow-up appointment.  Your physician has requested that you have an echocardiogram. Echocardiography is a painless test that uses sound waves to create images of your heart. It provides your doctor with information about the size and shape of your heart and how well your heart's chambers and valves are working. This procedure takes approximately one hour. There are no restrictions for this procedure.     Do the following things EVERYDAY: Weigh yourself in the morning before breakfast. Write it down and keep it in a log. Take your medicines as prescribed Eat low salt foods--Limit salt (sodium) to 2000 mg per day.  Stay as active as you can everyday Limit all fluids for the day to less than 2 liters  At the [facility], you and your health needs are our priority. As part of our continuing mission to provide you with exceptional heart care, we have created designated Provider Care Teams. These Care Teams include your primary Cardiologist (physician) and Advanced Practice Providers (APPs- Physician Assistants and Nurse Practitioners) who all work together to provide you with the care you need, when you need it.   You may see any of the following providers on your designated Care Team at your next follow up: Dr Glori Bickers Dr Loralie Champagne Dr. Roxana Hires, NP Lyda Jester, [place] [facility] [place], [place] Forestine Na, NP Audry Riles, PharmD   Please be sure to bring in all your medications bottles to every appointment.   If you have any questions or concerns  before your next appointment please send [place] a message through [place] or call our office at [phone].    TO LEAVE A MESSAGE FOR THE NURSE SELECT OPTION 2, PLEASE LEAVE A MESSAGE INCLUDING: YOUR NAME DATE OF BIRTH CALL BACK NUMBER REASON FOR CALL**this is important as we prioritize the call backs  YOU WILL RECEIVE A CALL BACK THE SAME DAY AS LONG AS YOU CALL BEFORE 4:00 PM

## 2022-12-27 NOTE — Progress Notes (Signed)
Cardiology: Dr. Caryl Comes HF Cardiology: Dr. Aundra Dubin  62 y.o. AAM with history of nonischemic cardiomyopathy with a long history of cardiomyopathy.  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  Echo in 7/18 showed EF 20-25% with diffuse hypokinesis and dilated RV.  He has a strong family history of CHF (father, sister, half-brother).  Sleep study in 12/18 with severe central sleep apnea, now on Bipap.  Echo in 8/19 showed EF 20-25%, moderate MR, mildly decreased RV systolic function.    Echo in 11/20 showed EF 25-30%, mildly decreased RV systolic function.  Echo in 3/22 showed EF 25-30%, diffuse hypokinesis, mildly decreased RV systolic function.   Echo 05/23 EF 30-35%, diffuse hypokinesis, mildly decreased RV systolic function.   He is here today for 4 month follow-up. Has been doing well from HF standpoint. 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 Denies dyspnea, orthopnea, PND or lower extremity edema. He is taking all meds as prescribed. No issues with side effects.  No ETOH. Smoking 6-7 cigarettes/day, had been 1.5 ppd at one point. Tried Wellbutrin, patches and gum in the past.  ECG (personally reviewed): A sensed/V paced, 61 bpm  [employer] device interrogation: HL index is 6, activity level 3.6 hrs/day, no AT/AF or VT   PMH: 1. Central sleep apnea: Uses Bipap.  2. LBBB: s/p [employer] CRT-D.   3. 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  4. History of atrial tachycardia 5. Active smoker 6. Chronic systolic CHF: nonischemic cardiomyopathy.  Known since prior to 2005.  Story CRT-D.  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  - Echo (7/18): EF 20-25%, severe dilated LV, diffuse hypokinesis, severe RV dilation with low normal systolic function, moderate TR, PASP 52 mmHg.  - Gynecomastia with spironolactone.  - Echo (8/19): EF 20-25%, moderate MR, mildly decreased RV systolic function.  - CPX (9/19): peak VO2 14.4, VE/VCO2 slope 40, RER 1.08.  Moderate  functional limitation due to HF and body habitus.  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  - Echo (11/20): EF 25-30%, mildly decreased RV systolic function.  - Echo (3/22): EF 25-30%, diffuse hypokinesis, mildly decreased RV systolic function.  - Echo (5/23): EF 30-35%, diffuse hypokinesis, mildly decreased RV systolic function.  SH: Smokes 4 cigs/day, rare ETOH, occasional marijuana.   Lives with friend in [place].   FH: Father with cardiomyopathy, 1/2 brother died from CHF, sister with CHF.   ROS: All systems reviewed and negative except as per HPI.   Current Outpatient Medications  Medication Sig Dispense Refill   aspirin 81 MG tablet Take 81 mg by mouth daily.     atorvastatin (LIPITOR) 40 MG tablet TAKE 1 TABLET(40 MG) BY MOUTH DAILY 90 tablet 3   carvedilol (COREG) 25 MG tablet Take 1 tablet (25 mg total) by mouth 2 (two) times daily with a meal. 180 tablet 3   cetirizine (ZYRTEC) 10 MG chewable tablet Chew 10 mg by mouth daily.     eplerenone (INSPRA) 50 MG tablet TAKE 1 TABLET(50 MG) BY MOUTH DAILY 30 tablet 5   FARXIGA 10 MG TABS tablet TAKE 1 TABLET(10 MG) BY MOUTH DAILY BEFORE AND BREAKFAST 90 tablet 3   furosemide (LASIX) 40 MG tablet Take 1 tablet (40 mg total) by mouth every morning AND 0.5 tablets (20 mg total) every evening. 135 tablet 3   mometasone (NASONEX) 50 MCG/ACT nasal spray Place 2 sprays into the nose as needed.     potassium chloride SA (KLOR-CON M)  20 MEQ tablet TAKE 1 TABLET(20 MEQ) BY MOUTH DAILY 90 tablet 3   sacubitril-valsartan (ENTRESTO) 97-103 MG Take 1 tablet by mouth 2 (two) times daily. 180 tablet 3   sotalol (BETAPACE) 80 MG tablet TAKE 1 TABLET(80 MG) BY MOUTH TWICE DAILY 180 tablet 3   No current facility-administered medications for this encounter.   BP 100/60   Pulse 60   Wt 112.6 kg (248 lb 3.2 oz)   SpO2 96%   BMI 35.61 kg/m   General:  Well appearing. Ambulated into clinic HEENT: normal Neck: supple. no JVD.  Carotids 2+ bilat; no bruits.  Cor: PMI nondisplaced. Regular rate & rhythm. No rubs, gallops or murmurs. Lungs: clear Abdomen: soft, nontender, nondistended.  Extremities: no cyanosis, clubbing, rash, edema Neuro: alert & orientedx3, cranial nerves grossly intact. moves all 4 extremities w/o difficulty. Affect pleasant   Assessment/Plan: 1. Chronic systolic CHF: Nonischemic cardiomyopathy x years.  [employer] CRT-D device.  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  Echo in 8/19 with EF 20-25%, echo in 11/20 showed EF 25-30%, echo 05/23 showed EF 30-35%.  CPX in 9/19 showed moderate functional limitation from heart failure.  He saw Dr. 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  - NYHA II. Volume looks good on exam. - Continue Lasix 40 qam/20 qpm.  BMET/BNP today.  - Continue dapagliflozin 10 mg daily.   - Continue Entresto 97/103 bid.  - Continue Coreg 25 mg bid.  - Intolerant spiro due to gynecomastia.  Continue eplerenone 50 mg daily.    - Off bidil d/t low BP - Taking sotalol b/c of high DFTs.  ECG showed paced QTc 509 msec (keep paced QTc < 550 msec).  2. Smoking: Discussed smoking cessation again.   - He has stopped bupropion. Tried patches and gum in the past. - He plans to try to quit cold [place] when ready 3. Atrial Tachycardia: He is on sotalol.  4. Central sleep apnea: Continue Bipap.    Followup: 4 months with Dr. Aundra Dubin with echo  [facility], [facility] 12/27/2022

## 2022-12-29 ENCOUNTER — Telehealth (HOSPITAL_COMMUNITY): Payer: Self-pay | Admitting: Pharmacy Technician

## 2022-12-29 ENCOUNTER — Other Ambulatory Visit (HOSPITAL_COMMUNITY): Payer: Self-pay

## 2022-12-29 ENCOUNTER — Telehealth (HOSPITAL_COMMUNITY): Payer: Self-pay

## 2022-12-29 MED ORDER — ENTRESTO 97-103 MG PO TABS: 1.0000 | ORAL_TABLET | Freq: Two times a day (BID) | ORAL | 3 refills | Status: DC

## 2022-12-29 NOTE — Telephone Encounter (Signed)
Advanced Heart Failure Patient Advocate Encounter  Prior Authorization for Delene Loll has been approved.    PA# [id number] Effective dates: 12/29/22 through 12/29/23  Charlann Boxer, CPhT

## 2022-12-29 NOTE — Telephone Encounter (Signed)
Patient Advocate Encounter   Received notification from [facility] that prior authorization for Spencer Robertson is required.   PA submitted on CoverMyMeds Key [id number] Status is pending   Will continue to follow.

## 2022-12-29 NOTE — Telephone Encounter (Signed)
PA approved, patient should be able to fill RX at his pharmacy now.

## 2022-12-29 NOTE — Telephone Encounter (Signed)
He called and states the pharmacy told him we need to do a prior auth for his Entresto. Can you look at this?

## 2022-12-29 NOTE — Telephone Encounter (Signed)
I submitted the prior authorization, just waiting for the insurance to approve.

## 2022-12-30 NOTE — Telephone Encounter (Signed)
I spoke with patient and gave him prior authorization update. He understood.

## 2023-01-06 ENCOUNTER — Ambulatory Visit (INDEPENDENT_AMBULATORY_CARE_PROVIDER_SITE_OTHER): Payer: Medicaid Other | Admitting: Primary Care

## 2023-01-20 ENCOUNTER — Ambulatory Visit (INDEPENDENT_AMBULATORY_CARE_PROVIDER_SITE_OTHER): Payer: Medicaid Other | Admitting: Primary Care

## 2023-02-01 ENCOUNTER — Ambulatory Visit (INDEPENDENT_AMBULATORY_CARE_PROVIDER_SITE_OTHER): Payer: Medicaid Other | Admitting: Primary Care

## 2023-02-01 ENCOUNTER — Encounter (INDEPENDENT_AMBULATORY_CARE_PROVIDER_SITE_OTHER): Payer: Self-pay | Admitting: Primary Care

## 2023-02-01 DIAGNOSIS — R7303 Prediabetes: Secondary | ICD-10-CM | POA: Diagnosis not present

## 2023-02-01 DIAGNOSIS — E785 Hyperlipidemia, unspecified: Secondary | ICD-10-CM | POA: Diagnosis not present

## 2023-02-02 LAB — CBC WITH DIFFERENTIAL/PLATELET
Basophils Absolute: 0 10*3/uL (ref 0.0–0.2)
Basos: 1 %
EOS (ABSOLUTE): 0.1 10*3/uL (ref 0.0–0.4)
Eos: 3 %
Hematocrit: 46.6 % (ref 37.5–51.0)
Hemoglobin: 16.1 g/dL (ref 13.0–17.7)
Immature Grans (Abs): 0 10*3/uL (ref 0.0–0.1)
Immature Granulocytes: 1 %
Lymphocytes Absolute: 1.9 10*3/uL (ref 0.7–3.1)
Lymphs: 41 %
MCH: 31.8 pg (ref 26.6–33.0)
MCHC: 34.5 g/dL (ref 31.5–35.7)
MCV: 92 fL (ref 79–97)
Monocytes Absolute: 0.5 10*3/uL (ref 0.1–0.9)
Monocytes: 11 %
Neutrophils Absolute: 2 10*3/uL (ref 1.4–7.0)
Neutrophils: 43 %
Platelets: 140 10*3/uL — ABNORMAL LOW (ref 150–450)
RBC: 5.07 x10E6/uL (ref 4.14–5.80)
RDW: 15.3 % (ref 11.6–15.4)
WBC: 4.5 10*3/uL (ref 3.4–10.8)

## 2023-02-02 LAB — LIPID PANEL
Chol/HDL Ratio: 3.1 ratio (ref 0.0–5.0)
Cholesterol, Total: 99 mg/dL — ABNORMAL LOW (ref 100–199)
HDL: 32 mg/dL — ABNORMAL LOW (ref >39)
LDL Chol Calc (NIH): 46 mg/dL (ref 0–99)
Triglycerides: 114 mg/dL (ref 0–149)
VLDL Cholesterol Cal: 21 mg/dL (ref 5–40)

## 2023-02-02 LAB — HEMOGLOBIN A1C
Est. average glucose Bld gHb Est-mCnc: 140 mg/dL
Hgb A1c MFr Bld: 6.5 % — ABNORMAL HIGH (ref 4.8–5.6)

## 2023-02-04 ENCOUNTER — Telehealth (INDEPENDENT_AMBULATORY_CARE_PROVIDER_SITE_OTHER): Payer: Self-pay

## 2023-02-04 NOTE — Telephone Encounter (Signed)
Contacted pt to go over lab results pt is aware and doesn't have any questions or concerns 

## 2023-02-06 ENCOUNTER — Encounter (INDEPENDENT_AMBULATORY_CARE_PROVIDER_SITE_OTHER): Payer: Self-pay | Admitting: Primary Care

## 2023-02-06 NOTE — Progress Notes (Signed)
Spencer  Koah Robertson, is a 62 y.o. male  [id number]  SD:[id number]  DOB - [birth year]  No chief complaint on file.      Subjective:   Spencer Robertson is a 62 y.o. male here today for a follow up visit. Patient has No headache, No chest pain, No abdominal pain - No Nausea, No new weakness tingling or numbness, No Cough - shortness of breath  No problems updated.  Allergies  Allergen Reactions   Spironolactone Other (See Comments)    gynecomastia    Past Medical History:  Diagnosis Date   CHF (congestive heart failure) (HCC)    Hyperlipidemia    Hypertension    Mitral regurgitation    Nonischemic cardiomyopathy (HCC)    Obesity    Obesity (BMI 30-39.9) 09/01/2015   OSA (obstructive sleep apnea)    severe   S/P implantation of automatic cardioverter/defibrillator (AICD)    Guidant Contact [id number]   Tobacco abuse     Current Outpatient Medications on File Prior to Visit  Medication Sig Dispense Refill   aspirin 81 MG tablet Take 81 mg by mouth daily.     atorvastatin (LIPITOR) 40 MG tablet TAKE 1 TABLET(40 MG) BY MOUTH DAILY 90 tablet 3   carvedilol (COREG) 25 MG tablet Take 1 tablet (25 mg total) by mouth 2 (two) times daily with a meal. 180 tablet 3   cetirizine (ZYRTEC) 10 MG chewable tablet Chew 10 mg by mouth daily.     eplerenone (INSPRA) 50 MG tablet TAKE 1 TABLET(50 MG) BY MOUTH DAILY 30 tablet 5   FARXIGA 10 MG TABS tablet TAKE 1 TABLET(10 MG) BY MOUTH DAILY BEFORE AND BREAKFAST 90 tablet 3   furosemide (LASIX) 40 MG tablet Take 1 tablet (40 mg total) by mouth every morning AND 0.5 tablets (20 mg total) every evening. 135 tablet 3   mometasone (NASONEX) 50 MCG/ACT nasal spray Place 2 sprays into the nose as needed.     potassium chloride SA (KLOR-CON M) 20 MEQ tablet TAKE 1 TABLET(20 MEQ) BY MOUTH DAILY 90 tablet 3   sacubitril-valsartan (ENTRESTO) 97-103 MG Take 1 tablet by mouth 2 (two) times daily. 180 tablet 3   sotalol (BETAPACE) 80 MG  tablet TAKE 1 TABLET(80 MG) BY MOUTH TWICE DAILY 180 tablet 3   No current facility-administered medications on file prior to visit.    Objective:  There were no vitals filed for this visit.  Comprehensive ROS Pertinent positive and negative noted in HPI   Exam General appearance : Awake, alert, not in any distress. Speech Clear. Not toxic looking HEENT: Atraumatic and Normocephalic, pupils equally reactive to light and accomodation Neck: Supple, no JVD. No cervical lymphadenopathy.  Chest: Good air entry bilaterally, no added sounds  CVS: S1 S2 regular, no murmurs.  Abdomen: Bowel sounds present, Non tender and not distended with no gaurding, rigidity or rebound. Extremities: B/L Lower Ext shows no edema, both legs are warm to touch Neurology: Awake alert, and oriented X 3, Non focal Skin: No Rash  Data Review Lab Results  Component Value Date   HGBA1C 6.5 (H) 02/01/2023   HGBA1C 6.3 (A) 05/26/2022   HGBA1C 5.9 (A) 08/28/2021    Assessment & Plan  Diagnoses and all orders for this visit:  Prediabetes - educated on lifestyle modifications, including but not limited to diet choices and adding exercise to daily routine.   -     CBC with Differential -     Hemoglobin A1c 6.5  Hyperlipidemia, unspecified hyperlipidemia type  Healthy lifestyle diet of fruits vegetables fish nuts whole grains and low saturated fat . Foods high in cholesterol or liver, fatty meats,cheese, butter avocados, nuts and seeds, chocolate and fried foods. -     Lipid Panel  Patient have been counseled extensively about nutrition and exercise. Other issues discussed during this visit include: low cholesterol diet, weight control and daily exercise, foot care, annual eye examinations at Ophthalmology, importance of adherence with medications and regular follow-up. We also discussed long term complications of uncontrolled diabetes and hypertension.   No follow-ups on file.  The patient was given clear  instructions to go to ER or return to medical center if symptoms don't improve, worsen or new problems develop. The patient verbalized understanding. The patient was told to call to get lab results if they haven't heard anything in the next week.   This note has been created with Surveyor, quantity. Any transcriptional errors are unintentional.   Kerin Perna, NP 02/06/2023, 8:31 PM

## 2023-02-08 ENCOUNTER — Other Ambulatory Visit (HOSPITAL_COMMUNITY): Payer: Self-pay | Admitting: Cardiology

## 2023-02-08 MED ORDER — SOTALOL HCL 80 MG PO TABS: 80.0000 mg | ORAL_TABLET | Freq: Two times a day (BID) | ORAL | 3 refills | Status: DC

## 2023-02-09 ENCOUNTER — Other Ambulatory Visit (HOSPITAL_COMMUNITY): Payer: Self-pay

## 2023-02-09 MED ORDER — CARVEDILOL 25 MG PO TABS: 25.0000 mg | ORAL_TABLET | Freq: Two times a day (BID) | ORAL | 3 refills | Status: DC

## 2023-03-11 ENCOUNTER — Other Ambulatory Visit (HOSPITAL_COMMUNITY): Payer: Self-pay

## 2023-03-11 MED ORDER — DAPAGLIFLOZIN PROPANEDIOL 10 MG PO TABS: ORAL_TABLET | ORAL | 3 refills | Status: DC

## 2023-04-06 ENCOUNTER — Telehealth (HOSPITAL_COMMUNITY): Payer: Self-pay

## 2023-04-06 NOTE — Telephone Encounter (Signed)
Hey can you set him up for an echo and appt with Dr. Shirlee Latch?  Thanks!

## 2023-04-27 ENCOUNTER — Telehealth: Payer: Self-pay | Admitting: Internal Medicine

## 2023-04-27 NOTE — Telephone Encounter (Signed)
Left message for patient informing him there is no documentation of anyone from out office attempting to contact him or leave a message. He has not had any recent labs/orders or anything waiting to be scheduled from what I can see.  Informed him if someone needs to get in touch with him they will likely reach back out. Provided office number for callback if any other questions/needs.

## 2023-04-27 NOTE — Telephone Encounter (Signed)
Patient states he is returning a call, but no one left him a message.

## 2023-05-03 ENCOUNTER — Ambulatory Visit (INDEPENDENT_AMBULATORY_CARE_PROVIDER_SITE_OTHER): Payer: Medicaid Other | Admitting: Primary Care

## 2023-05-31 ENCOUNTER — Ambulatory Visit (HOSPITAL_BASED_OUTPATIENT_CLINIC_OR_DEPARTMENT_OTHER)
Admission: RE | Admit: 2023-05-31 | Discharge: 2023-05-31 | Disposition: A | Discharge status: Home / Self Care | Payer: Medicaid Other | Source: Ambulatory Visit | Attending: Cardiology | Admitting: Cardiology

## 2023-05-31 ENCOUNTER — Encounter (HOSPITAL_COMMUNITY): Payer: Self-pay | Admitting: Cardiology

## 2023-05-31 ENCOUNTER — Ambulatory Visit (HOSPITAL_COMMUNITY)
Admission: RE | Admit: 2023-05-31 | Discharge: 2023-05-31 | Disposition: A | Discharge status: Home / Self Care | Payer: Medicaid Other | Source: Ambulatory Visit | Attending: Cardiology | Admitting: Cardiology

## 2023-05-31 VITALS — BP 100/64 | HR 60 | Wt 238.8 lb

## 2023-05-31 DIAGNOSIS — E785 Hyperlipidemia, unspecified: Secondary | ICD-10-CM | POA: Diagnosis not present

## 2023-05-31 DIAGNOSIS — R000 Tachycardia, unspecified: Secondary | ICD-10-CM | POA: Insufficient documentation

## 2023-05-31 DIAGNOSIS — G4730 Sleep apnea, unspecified: Secondary | ICD-10-CM | POA: Insufficient documentation

## 2023-05-31 DIAGNOSIS — I428 Other cardiomyopathies: Secondary | ICD-10-CM | POA: Insufficient documentation

## 2023-05-31 DIAGNOSIS — F17200 Nicotine dependence, unspecified, uncomplicated: Secondary | ICD-10-CM | POA: Diagnosis not present

## 2023-05-31 DIAGNOSIS — I081 Rheumatic disorders of both mitral and tricuspid valves: Secondary | ICD-10-CM | POA: Insufficient documentation

## 2023-05-31 DIAGNOSIS — I5022 Chronic systolic (congestive) heart failure: Secondary | ICD-10-CM | POA: Insufficient documentation

## 2023-05-31 DIAGNOSIS — I110 Hypertensive heart disease with heart failure: Secondary | ICD-10-CM | POA: Insufficient documentation

## 2023-05-31 LAB — BASIC METABOLIC PANEL
Anion gap: 6 (ref 5–15)
BUN: 16 mg/dL (ref 8–23)
CO2: 26 mmol/L (ref 22–32)
Calcium: 9.1 mg/dL (ref 8.9–10.3)
Chloride: 103 mmol/L (ref 98–111)
Creatinine, Ser: 1.15 mg/dL (ref 0.61–1.24)
GFR, Estimated: >60 mL/min (ref >60)
Glucose, Bld: 97 mg/dL (ref 70–99)
Potassium: 4 mmol/L (ref 3.5–5.1)
Sodium: 135 mmol/L (ref 135–145)

## 2023-05-31 LAB — ECHOCARDIOGRAM COMPLETE
AR max vel: 3.05 cm2
AV Area VTI: 2.89 cm2
AV Area mean vel: 2.9 cm2
AV Mean grad: 3 mmHg
AV Peak grad: 5.9 mmHg
Ao pk vel: 1.22 m/s
Area-P 1/2: 3.12 cm2
Calc EF: 30.3 %
MV M vel: 4.56 m/s
MV Peak grad: 83.2 mmHg
MV Vena cont: 0.1 cm
Radius: 0.3 cm
S' Lateral: 5.8 cm
Single Plane A2C EF: 28.4 %
Single Plane A4C EF: 28.8 %

## 2023-05-31 LAB — BRAIN NATRIURETIC PEPTIDE: B Natriuretic Peptide: 151.1 pg/mL — ABNORMAL HIGH (ref 0.0–100.0)

## 2023-05-31 NOTE — Patient Instructions (Signed)
There has been no changes to your medications.  Labs done today, your results will be available in MyChart, we will contact you for abnormal readings.  Your physician recommends that you schedule a follow-up appointment in: 4 months  If you have any questions or concerns before your next appointment please send us a message through mychart or call our office at [phone].    TO LEAVE A MESSAGE FOR THE NURSE SELECT OPTION 2, PLEASE LEAVE A MESSAGE INCLUDING: YOUR NAME DATE OF BIRTH CALL BACK NUMBER REASON FOR CALL**this is important as we prioritize the call backs  YOU WILL RECEIVE A CALL BACK THE SAME DAY AS LONG AS YOU CALL BEFORE 4:00 PM  At the [facility], you and your health needs are our priority. As part of our continuing mission to provide you with exceptional heart care, we have created designated Provider Care Teams. These Care Teams include your primary Cardiologist (physician) and Advanced Practice Providers (APPs- Physician Assistants and Nurse Practitioners) who all work together to provide you with the care you need, when you need it.   You may see any of the following providers on your designated Care Team at your next follow up: Dr Daniel Bensimhon Dr Dalton McLean Dr. Aditya Sabharwal Amy Clegg, NP Brittainy Simmons, PA Jessica Milford,NP Lindsay Finch, PA Alma Diaz, NP Lauren Kemp, PharmD   Please be sure to bring in all your medications bottles to every appointment.    Thank you for choosing [facility]    

## 2023-05-31 NOTE — Progress Notes (Signed)
Cardiology: Dr. Graciela Husbands HF Cardiology: Dr. Shirlee Latch  62 y.o. with history of nonischemic cardiomyopathy with a long history of cardiomyopathy.  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  Echo in 7/18 showed EF 20-25% with diffuse hypokinesis and dilated RV.  He has a strong family history of CHF (father, sister, half-brother).  Sleep study in 12/18 with severe central sleep apnea, now on Bipap.  Echo in 8/19 showed EF 20-25%, moderate MR, mildly decreased RV systolic function.    Echo in 11/20 showed EF 25-30%, mildly decreased RV systolic function.  Echo in 3/22 showed EF 25-30%, diffuse hypokinesis, mildly decreased RV systolic function.   Echo in 5/23 showed EF 30-35%, diffuse hypokinesis, mildly decreased RV systolic function.  Echo today showed EF 20-25%, diffuse hypokinesis, moderate LV dilation, mildly decreased RV systolic function, moderate MR.   He returns for HF followup.  He is still smoking occasionally.  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  He denies exertional dyspnea.  Really no significant exercise limitations, he push mows his grass and does other yardwork with no problems.  No orthopnea/PND.  No chest pain.  No lightheadedness.   ECG (personally reviewed): A-BiV pacing, paced QTc 529 msec  Labs (8/18): K 4.3, creatinine 0.88 Labs (08/17/2017) K 4.1 Creatinine 1.17, BNP 114 Labs (11/18): K 3.9, creatinine 1.14 Labs (4/19): K 4.5, creatinine 1.06 Labs (6/19): K 4, creatinine 1.09 Labs (9/19): K 3.9, creatinine 1.36 Labs (11/19): K 4.4, creatinine 1.27 Labs (3/20): K 4.5, creatinine 1.31 Labs (8/20): K 4.4, creatinine 1.34 Labs (12/20): K 4.8, creatinine 1.53 Labs (7/21): K 4, creatinine 1.46 Labs (9/21): LDL 56, TGs 337 Labs (10/21): K 3.9, creatinine 1.35 Labs (3/22): LDL 30 Labs (7/22): K 4.1, creatinine 1.23 Labs (11/22): BNP 83, K 4.3, creatinine 1.07 Labs (2/24): K 3.8, creatinine 1.22, BNP 118, hgb 16.1  PMH: 1. Central sleep apnea:  Uses Bipap.  2. LBBB: s/p [employer] CRT-D.   3. 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  4. History of atrial tachycardia 5. Active smoker 6. Chronic systolic CHF: nonischemic cardiomyopathy.  Known since prior to 2005.  [employer] CRT-D.  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  - Echo (7/18): EF 20-25%, severe dilated LV, diffuse hypokinesis, severe RV dilation with low normal systolic function, moderate TR, PASP 52 mmHg.  - Gynecomastia with spironolactone.  - Echo (8/19): EF 20-25%, moderate MR, mildly decreased RV systolic function.  - CPX (9/19): peak VO2 14.4, VE/VCO2 slope 40, RER 1.08.  Moderate functional limitation due to HF and body habitus.  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  - Echo (11/20): EF 25-30%, mildly decreased RV systolic function.  - Echo (3/22): EF 25-30%, diffuse hypokinesis, mildly decreased RV systolic function.  - Echo (5/23): EF 30-35%, diffuse hypokinesis, mildly decreased RV systolic function. - Echo (6/24): EF 20-25%, diffuse hypokinesis, moderate LV dilation, mildly decreased RV systolic function, moderate MR.   SH: Smokes 4 cigs/day, rare ETOH, occasional marijuana.   Lives with friend in [place].   FH: Father with cardiomyopathy, 1/2 brother died from CHF, sister with CHF.   ROS: All systems reviewed and negative except as per HPI.   Current Outpatient Medications  Medication Sig Dispense Refill   aspirin 81 MG tablet Take 81 mg by mouth daily.     atorvastatin (LIPITOR) 40 MG tablet TAKE 1 TABLET(40 MG) BY MOUTH DAILY 90 tablet 3   carvedilol (COREG) 25 MG tablet Take  1 tablet (25 mg total) by mouth 2 (two) times daily with a meal. 180 tablet 3   cetirizine (ZYRTEC) 10 MG chewable tablet Chew 10 mg by mouth daily.     dapagliflozin propanediol (FARXIGA) 10 MG TABS tablet TAKE 1 TABLET(10 MG) BY MOUTH DAILY BEFORE AND BREAKFAST 90 tablet 3   eplerenone (INSPRA) 50 MG tablet TAKE 1 TABLET(50 MG) BY MOUTH DAILY 30 tablet  5   furosemide (LASIX) 40 MG tablet Take 1 tablet (40 mg total) by mouth every morning AND 0.5 tablets (20 mg total) every evening. 135 tablet 3   mometasone (NASONEX) 50 MCG/ACT nasal spray Place 2 sprays into the nose as needed.     potassium chloride SA (KLOR-CON M) 20 MEQ tablet TAKE 1 TABLET(20 MEQ) BY MOUTH DAILY 90 tablet 3   sacubitril-valsartan (ENTRESTO) 97-103 MG Take 1 tablet by mouth 2 (two) times daily. 180 tablet 3   sotalol (BETAPACE) 80 MG tablet Take 1 tablet (80 mg total) by mouth 2 (two) times daily. 180 tablet 3   No current facility-administered medications for this encounter.   BP 100/64   Pulse 60   Wt 108.3 kg (238 lb 12.8 oz)   SpO2 98%   BMI 34.26 kg/m   General: NAD Neck: No JVD, no thyromegaly or thyroid nodule.  Lungs: Clear to auscultation bilaterally with normal respiratory effort. CV: Nondisplaced PMI.  Heart regular S1/S2, no S3/S4, no murmur.  No peripheral edema.  No carotid bruit.  Normal pedal pulses.  Abdomen: Soft, nontender, no hepatosplenomegaly, no distention.  Skin: Intact without lesions or rashes.  Neurologic: Alert and oriented x 3.  Psych: Normal affect. Extremities: No clubbing or cyanosis.  HEENT: Normal.   Assessment/Plan: 1. Chronic systolic CHF: Nonischemic cardiomyopathy x years.  [employer] CRT-D device.  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  Echo in 8/19 with EF 20-25%, echo in 11/20 showed EF 25-30%, echo in 5/23 showed EF 30-35%, echo today showed EF 20-25% with mild RV dysfunction.  CPX in 9/19 showed moderate functional limitation from heart failure.  He saw Dr. 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  He is not volume overloaded on exam, NYHA class I symptoms. BP runs low at times but no orthostatic symptoms and normal creatinine.  - Continue Lasix 40 qam/20 qpm.  BMET/BNP today.  - Continue dapagliflozin 10 mg daily.   -  Continue Entresto 97/103 bid.  - Continue Coreg 25 mg bid.  - Intolerant spiro due to gynecomastia.  Continue eplerenone 50 mg daily.    - BP too low to add Bidil.   - Taking sotalol b/c of high DFTs.  ECG showed paced QTc 529 msec (keep paced QTc < 550 msec).  - If he becomes more symptomatic in the future, consider barostimulation activation therapy.  2. Smoking: Discussed smoking cessation again.   - Bupropion did not help. .  3. Atrial Tachycardia: He is on sotalol.  4. Central sleep apnea: Continue Bipap.    Followup in 4 months with APP.   Marca Ancona 05/31/2023

## 2023-06-08 ENCOUNTER — Other Ambulatory Visit (HOSPITAL_COMMUNITY): Payer: Self-pay

## 2023-06-08 MED ORDER — FUROSEMIDE 40 MG PO TABS
ORAL_TABLET | ORAL | 3 refills | Status: DC
Start: 2023-06-08 — End: 2024-03-28

## 2023-06-22 ENCOUNTER — Other Ambulatory Visit (HOSPITAL_COMMUNITY): Payer: Self-pay

## 2023-06-22 MED ORDER — POTASSIUM CHLORIDE CRYS ER 20 MEQ PO TBCR: EXTENDED_RELEASE_TABLET | ORAL | 3 refills | Status: DC

## 2023-07-25 ENCOUNTER — Ambulatory Visit (INDEPENDENT_AMBULATORY_CARE_PROVIDER_SITE_OTHER): Payer: Medicaid Other

## 2023-07-25 DIAGNOSIS — I428 Other cardiomyopathies: Secondary | ICD-10-CM | POA: Diagnosis not present

## 2023-07-26 LAB — CUP PACEART REMOTE DEVICE CHECK
Battery Remaining Longevity: 102 mo
Battery Remaining Percentage: 100 %
Brady Statistic RA Percent Paced: 33 %
Brady Statistic RV Percent Paced: 98 %
Date Time Interrogation Session: 20240820141600
HighPow Impedance: 46 Ohm
Implantable Lead Connection Status: 753985
Implantable Lead Connection Status: 753985
Implantable Lead Connection Status: 753985
Implantable Lead Implant Date: 20051122
Implantable Lead Implant Date: 20051122
Implantable Lead Implant Date: 20051122
Implantable Lead Location: 753858
Implantable Lead Location: 753859
Implantable Lead Location: 753860
Implantable Lead Model: 158
Implantable Lead Model: 4194
Implantable Lead Model: 5076
Implantable Lead Serial Number: 159458
Implantable Pulse Generator Implant Date: 20211101
Lead Channel Impedance Value: 463 Ohm
Lead Channel Impedance Value: 472 Ohm
Lead Channel Impedance Value: 864 Ohm
Lead Channel Setting Pacing Amplitude: 2 V
Lead Channel Setting Pacing Amplitude: 2.4 V
Lead Channel Setting Pacing Amplitude: 2.8 V
Lead Channel Setting Pacing Pulse Width: 0.4 ms
Lead Channel Setting Pacing Pulse Width: 1 ms
Lead Channel Setting Sensing Sensitivity: 0.5 mV
Lead Channel Setting Sensing Sensitivity: 1 mV
Pulse Gen Serial Number: 147594

## 2023-08-04 NOTE — Progress Notes (Signed)
Remote ICD transmission.   

## 2023-08-18 ENCOUNTER — Telehealth: Payer: Self-pay | Admitting: Internal Medicine

## 2023-08-18 NOTE — Telephone Encounter (Signed)
This is a CHF pt 

## 2023-08-18 NOTE — Telephone Encounter (Signed)
*  STAT* If patient is at the pharmacy, call can be transferred to refill team.   1. Which medications need to be refilled? (please list name of each medication and dose if known)   carvedilol (COREG) 25 MG tablet   2. Would you like to learn more about the convenience, safety, & potential cost savings by using the [facility]?   3. Are you open to using the [facility] (Type [facility]. ).  4. Which pharmacy/location (including street and city if local pharmacy) is medication to be sent to?  [id number] - [place], [place] - [address] AT [address]   5. Do they need a 30 day or 90 day supply?   90 day  Patient stated he is completely out of this medication.  Patient has appointment scheduled on 1/6.

## 2023-08-19 MED ORDER — CARVEDILOL 25 MG PO TABS: 25.0000 mg | ORAL_TABLET | Freq: Two times a day (BID) | ORAL | 3 refills | Status: DC

## 2023-08-19 NOTE — Telephone Encounter (Signed)
Done

## 2023-09-01 ENCOUNTER — Other Ambulatory Visit (HOSPITAL_COMMUNITY): Payer: Self-pay

## 2023-09-01 DIAGNOSIS — I5022 Chronic systolic (congestive) heart failure: Secondary | ICD-10-CM

## 2023-09-01 MED ORDER — EPLERENONE 50 MG PO TABS: ORAL_TABLET | ORAL | 6 refills | Status: DC

## 2023-09-12 ENCOUNTER — Telehealth (HOSPITAL_COMMUNITY): Payer: Self-pay

## 2023-09-12 NOTE — Progress Notes (Signed)
Cardiology: Dr. Graciela Husbands HF Cardiology: Dr. Shirlee Latch  62 y.o. with history of nonischemic cardiomyopathy with a long history of cardiomyopathy.  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  Echo in 7/18 showed EF 20-25% with diffuse hypokinesis and dilated RV.  He has a strong family history of CHF (father, sister, half-brother).  Sleep study in 12/18 with severe central sleep apnea, now on Bipap.  Echo in 8/19 showed EF 20-25%, moderate MR, mildly decreased RV systolic function.    Echo in 11/20 showed EF 25-30%, mildly decreased RV systolic function.  Echo in 3/22 showed EF 25-30%, diffuse hypokinesis, mildly decreased RV systolic function.   Echo in 5/23 showed EF 30-35%, diffuse hypokinesis, mildly decreased RV systolic function.  Echo today showed EF 20-25%, diffuse hypokinesis, moderate LV dilation, mildly decreased RV systolic function, moderate MR.   He returns for HF followup.  He is still smoking occasionally.  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  He denies exertional dyspnea.  Really no significant exercise limitations, he push mows his grass and does other yardwork with no problems.  No orthopnea/PND.  No chest pain.  No lightheadedness.   ECG (personally reviewed): A-BiV pacing, paced QTc 529 msec  Labs (8/18): K 4.3, creatinine 0.88 Labs (08/17/2017) K 4.1 Creatinine 1.17, BNP 114 Labs (11/18): K 3.9, creatinine 1.14 Labs (4/19): K 4.5, creatinine 1.06 Labs (6/19): K 4, creatinine 1.09 Labs (9/19): K 3.9, creatinine 1.36 Labs (11/19): K 4.4, creatinine 1.27 Labs (3/20): K 4.5, creatinine 1.31 Labs (8/20): K 4.4, creatinine 1.34 Labs (12/20): K 4.8, creatinine 1.53 Labs (7/21): K 4, creatinine 1.46 Labs (9/21): LDL 56, TGs 337 Labs (10/21): K 3.9, creatinine 1.35 Labs (3/22): LDL 30 Labs (7/22): K 4.1, creatinine 1.23 Labs (11/22): BNP 83, K 4.3, creatinine 1.07 Labs (2/24): K 3.8, creatinine 1.22, BNP 118, hgb 16.1  PMH: 1. Central sleep apnea:  Uses Bipap.  2. LBBB: s/p [employer] CRT-D.   3. 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  4. History of atrial tachycardia 5. Active smoker 6. Chronic systolic CHF: nonischemic cardiomyopathy.  Known since prior to 2005.  [employer] CRT-D.  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  - Echo (7/18): EF 20-25%, severe dilated LV, diffuse hypokinesis, severe RV dilation with low normal systolic function, moderate TR, PASP 52 mmHg.  - Gynecomastia with spironolactone.  - Echo (8/19): EF 20-25%, moderate MR, mildly decreased RV systolic function.  - CPX (9/19): peak VO2 14.4, VE/VCO2 slope 40, RER 1.08.  Moderate functional limitation due to HF and body habitus.  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  - Echo (11/20): EF 25-30%, mildly decreased RV systolic function.  - Echo (3/22): EF 25-30%, diffuse hypokinesis, mildly decreased RV systolic function.  - Echo (5/23): EF 30-35%, diffuse hypokinesis, mildly decreased RV systolic function. - Echo (6/24): EF 20-25%, diffuse hypokinesis, moderate LV dilation, mildly decreased RV systolic function, moderate MR.   SH: Smokes 4 cigs/day, rare ETOH, occasional marijuana.   Lives with friend in [place].   FH: Father with cardiomyopathy, 1/2 brother died from CHF, sister with CHF.   ROS: All systems reviewed and negative except as per HPI.   Current Outpatient Medications  Medication Sig Dispense Refill   aspirin 81 MG tablet Take 81 mg by mouth daily.     atorvastatin (LIPITOR) 40 MG tablet TAKE 1 TABLET(40 MG) BY MOUTH DAILY 90 tablet 3   carvedilol (COREG) 25 MG tablet Take  1 tablet (25 mg total) by mouth 2 (two) times daily with a meal. 180 tablet 3   cetirizine (ZYRTEC) 10 MG chewable tablet Chew 10 mg by mouth daily.     dapagliflozin propanediol (FARXIGA) 10 MG TABS tablet TAKE 1 TABLET(10 MG) BY MOUTH DAILY BEFORE AND BREAKFAST 90 tablet 3   eplerenone (INSPRA) 50 MG tablet TAKE 1 TABLET(50 MG) BY MOUTH DAILY 30 tablet  6   furosemide (LASIX) 40 MG tablet Take 1 tablet (40 mg total) by mouth every morning AND 0.5 tablets (20 mg total) every evening. 135 tablet 3   mometasone (NASONEX) 50 MCG/ACT nasal spray Place 2 sprays into the nose as needed.     potassium chloride SA (KLOR-CON M) 20 MEQ tablet TAKE 1 TABLET(20 MEQ) BY MOUTH DAILY 90 tablet 3   sacubitril-valsartan (ENTRESTO) 97-103 MG Take 1 tablet by mouth 2 (two) times daily. 180 tablet 3   sotalol (BETAPACE) 80 MG tablet Take 1 tablet (80 mg total) by mouth 2 (two) times daily. 180 tablet 3   No current facility-administered medications for this visit.   There were no vitals taken for this visit.  General: NAD Neck: No JVD, no thyromegaly or thyroid nodule.  Lungs: Clear to auscultation bilaterally with normal respiratory effort. CV: Nondisplaced PMI.  Heart regular S1/S2, no S3/S4, no murmur.  No peripheral edema.  No carotid bruit.  Normal pedal pulses.  Abdomen: Soft, nontender, no hepatosplenomegaly, no distention.  Skin: Intact without lesions or rashes.  Neurologic: Alert and oriented x 3.  Psych: Normal affect. Extremities: No clubbing or cyanosis.  HEENT: Normal.   Assessment/Plan: 1. Chronic systolic CHF: Nonischemic cardiomyopathy x years.  [employer] CRT-D device.  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  Echo in 8/19 with EF 20-25%, echo in 11/20 showed EF 25-30%, echo in 5/23 showed EF 30-35%, echo today showed EF 20-25% with mild RV dysfunction.  CPX in 9/19 showed moderate functional limitation from heart failure.  He saw Dr. 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  He is not volume overloaded on exam, NYHA class I symptoms. BP runs low at times but no orthostatic symptoms and normal creatinine.  - Continue Lasix 40 qam/20 qpm.  BMET/BNP today.  - Continue dapagliflozin 10 mg daily.   - Continue Entresto 97/103 bid.  - Continue Coreg 25  mg bid.  - Intolerant spiro due to gynecomastia.  Continue eplerenone 50 mg daily.    - BP too low to add Bidil.   - Taking sotalol b/c of high DFTs.  ECG showed paced QTc 529 msec (keep paced QTc < 550 msec).  - If he becomes more symptomatic in the future, consider barostimulation activation therapy.  2. Smoking: Discussed smoking cessation again.   - Bupropion did not help. .  3. Atrial Tachycardia: He is on sotalol.  4. Central sleep apnea: Continue Bipap.    Followup in 4 months with APP.   Anderson Malta [facility] 09/12/2023

## 2023-09-12 NOTE — Telephone Encounter (Signed)
Called to confirm/remind patient of their appointment at the [facility] on 09/13/23.   Patient reminded to bring all medications and/or complete list.  Confirmed patient has transportation. Gave directions, instructed to utilize valet parking.  Confirmed appointment prior to ending call.

## 2023-09-13 ENCOUNTER — Ambulatory Visit (HOSPITAL_COMMUNITY)
Admission: RE | Admit: 2023-09-13 | Discharge: 2023-09-13 | Disposition: A | Discharge status: Home / Self Care | Payer: Medicaid Other | Source: Ambulatory Visit | Attending: Family Medicine | Admitting: Family Medicine

## 2023-09-13 ENCOUNTER — Telehealth (HOSPITAL_COMMUNITY): Payer: Self-pay

## 2023-09-13 ENCOUNTER — Encounter (HOSPITAL_COMMUNITY): Payer: Self-pay

## 2023-09-13 VITALS — BP 98/64 | HR 60 | Wt 236.4 lb

## 2023-09-13 DIAGNOSIS — I4719 Other supraventricular tachycardia: Secondary | ICD-10-CM | POA: Diagnosis not present

## 2023-09-13 DIAGNOSIS — I5022 Chronic systolic (congestive) heart failure: Secondary | ICD-10-CM

## 2023-09-13 DIAGNOSIS — Z8249 Family history of ischemic heart disease and other diseases of the circulatory system: Secondary | ICD-10-CM | POA: Diagnosis not present

## 2023-09-13 DIAGNOSIS — G4731 Primary central sleep apnea: Secondary | ICD-10-CM | POA: Insufficient documentation

## 2023-09-13 DIAGNOSIS — N62 Hypertrophy of breast: Secondary | ICD-10-CM | POA: Insufficient documentation

## 2023-09-13 DIAGNOSIS — Z79899 Other long term (current) drug therapy: Secondary | ICD-10-CM | POA: Diagnosis not present

## 2023-09-13 DIAGNOSIS — F17200 Nicotine dependence, unspecified, uncomplicated: Secondary | ICD-10-CM | POA: Insufficient documentation

## 2023-09-13 DIAGNOSIS — I428 Other cardiomyopathies: Secondary | ICD-10-CM | POA: Diagnosis not present

## 2023-09-13 LAB — BASIC METABOLIC PANEL
Anion gap: 8 (ref 5–15)
BUN: 29 mg/dL — ABNORMAL HIGH (ref 8–23)
CO2: 27 mmol/L (ref 22–32)
Calcium: 9.1 mg/dL (ref 8.9–10.3)
Chloride: 101 mmol/L (ref 98–111)
Creatinine, Ser: 1.48 mg/dL — ABNORMAL HIGH (ref 0.61–1.24)
GFR, Estimated: 53 mL/min — ABNORMAL LOW (ref >60)
Glucose, Bld: 100 mg/dL — ABNORMAL HIGH (ref 70–99)
Potassium: 4.6 mmol/L (ref 3.5–5.1)
Sodium: 136 mmol/L (ref 135–145)

## 2023-09-13 LAB — BRAIN NATRIURETIC PEPTIDE: B Natriuretic Peptide: 190.3 pg/mL — ABNORMAL HIGH (ref 0.0–100.0)

## 2023-09-13 NOTE — Telephone Encounter (Addendum)
Pt aware, agreeable, and verbalized understanding  Labs schedule med list updated ----- Message from Jacklynn Ganong sent at 09/13/2023  3:36 PM EDT ----- Kidney function elevated Decrease Lasix to 40 mg daily Repeat BMET in 2 weeks

## 2023-09-13 NOTE — Patient Instructions (Addendum)
Thank you for coming in today  If you had labs drawn today, any labs that are abnormal the clinic will call you No news is good news  Medications: No changes  Follow up appointments:  Your physician recommends that you schedule a follow-up appointment in:  4 months With Dr. Earlean Shawl will receive a reminder letter in the mail a few months in advance. If you don't receive a letter, please call our office to schedule the follow-up appointment.    Do the following things EVERYDAY: Weigh yourself in the morning before breakfast. Write it down and keep it in a log. Take your medicines as prescribed Eat low salt foods--Limit salt (sodium) to 2000 mg per day.  Stay as active as you can everyday Limit all fluids for the day to less than 2 liters   At the [facility], you and your health needs are our priority. As part of our continuing mission to provide you with exceptional heart care, we have created designated Provider Care Teams. These Care Teams include your primary Cardiologist (physician) and Advanced Practice Providers (APPs- Physician Assistants and Nurse Practitioners) who all work together to provide you with the care you need, when you need it.   You may see any of the following providers on your designated Care Team at your next follow up: Dr Arvilla Meres Dr Marca Ancona Dr. Marcos Eke, NP Robbie Lis, [place] [facility] [place], [place] Brynda Peon, NP Karle Plumber, PharmD   Please be sure to bring in all your medications bottles to every appointment.    Thank you for choosing [facility]  If you have any questions or concerns before your next appointment please send [place] a message through [place] or call our office at [phone].    TO LEAVE A MESSAGE FOR THE NURSE SELECT OPTION 2, PLEASE LEAVE A MESSAGE INCLUDING: YOUR NAME DATE OF BIRTH CALL BACK NUMBER REASON FOR CALL**this  is important as we prioritize the call backs  YOU WILL RECEIVE A CALL BACK THE SAME DAY AS LONG AS YOU CALL BEFORE 4:00 PM

## 2023-09-17 ENCOUNTER — Other Ambulatory Visit (INDEPENDENT_AMBULATORY_CARE_PROVIDER_SITE_OTHER): Payer: Self-pay | Admitting: Primary Care

## 2023-09-19 ENCOUNTER — Other Ambulatory Visit (INDEPENDENT_AMBULATORY_CARE_PROVIDER_SITE_OTHER): Payer: Self-pay | Admitting: Primary Care

## 2023-09-19 ENCOUNTER — Other Ambulatory Visit (INDEPENDENT_AMBULATORY_CARE_PROVIDER_SITE_OTHER): Payer: Self-pay

## 2023-09-19 MED ORDER — ATORVASTATIN CALCIUM 40 MG PO TABS: 40.0000 mg | ORAL_TABLET | Freq: Every day | ORAL | 0 refills | Status: DC

## 2023-09-19 NOTE — Telephone Encounter (Signed)
Requested medication (s) are due for refill today: expired medication   Requested medication (s) are on the active medication list: yes   Last refill:  09/13/22 #90 3 refills  Future visit scheduled: no   Notes to clinic:  expired medication. Do you want to renew Rx?     Requested Prescriptions  Pending Prescriptions Disp Refills   atorvastatin (LIPITOR) 40 MG tablet [Pharmacy Med Name: ATORVASTATIN 40MG  TABLETS] 90 tablet 3    Sig: TAKE 1 TABLET(40 MG) BY MOUTH DAILY     Cardiovascular:  Antilipid - Statins Failed - 09/17/2023  9:22 AM      Failed - Lipid Panel in normal range within the last 12 months    Cholesterol, Total  Date Value Ref Range Status  02/01/2023 99 (L) 100 - 199 mg/dL Final   LDL Chol Calc (NIH)  Date Value Ref Range Status  02/01/2023 46 0 - 99 mg/dL Final   HDL  Date Value Ref Range Status  02/01/2023 32 (L) >39 mg/dL Final   Triglycerides  Date Value Ref Range Status  02/01/2023 114 0 - 149 mg/dL Final         Passed - Patient is not pregnant      Passed - Valid encounter within last 12 months    Recent Outpatient Visits           7 months ago Prediabetes   [place] [facility] Grayce Sessions, NP   1 year ago Prediabetes   [place] [facility] Grayce Sessions, NP   2 years ago Prediabetes   [place] [facility] Grayce Sessions, NP   2 years ago Prediabetes   [place] [facility] Bing Neighbors, NP   3 years ago Encounter to establish care   [place] [facility] Grayce Sessions, NP

## 2023-09-27 ENCOUNTER — Other Ambulatory Visit (HOSPITAL_COMMUNITY): Payer: Medicaid Other

## 2023-10-04 ENCOUNTER — Encounter: Payer: Self-pay | Admitting: Family Medicine

## 2023-10-24 ENCOUNTER — Ambulatory Visit: Payer: Medicaid Other

## 2023-10-24 DIAGNOSIS — I5022 Chronic systolic (congestive) heart failure: Secondary | ICD-10-CM

## 2023-10-24 DIAGNOSIS — I428 Other cardiomyopathies: Secondary | ICD-10-CM

## 2023-10-25 LAB — CUP PACEART REMOTE DEVICE CHECK
Battery Remaining Longevity: 90 mo
Battery Remaining Percentage: 100 %
Brady Statistic RA Percent Paced: 33 %
Brady Statistic RV Percent Paced: 98 %
Date Time Interrogation Session: 20241118052700
HighPow Impedance: 46 Ohm
Implantable Lead Connection Status: 753985
Implantable Lead Connection Status: 753985
Implantable Lead Connection Status: 753985
Implantable Lead Implant Date: 20051122
Implantable Lead Implant Date: 20051122
Implantable Lead Implant Date: 20051122
Implantable Lead Location: 753858
Implantable Lead Location: 753859
Implantable Lead Location: 753860
Implantable Lead Model: 158
Implantable Lead Model: 4194
Implantable Lead Model: 5076
Implantable Lead Serial Number: 159458
Implantable Pulse Generator Implant Date: 20211101
Lead Channel Impedance Value: 448 Ohm
Lead Channel Impedance Value: 524 Ohm
Lead Channel Impedance Value: 892 Ohm
Lead Channel Setting Pacing Amplitude: 2 V
Lead Channel Setting Pacing Amplitude: 2.4 V
Lead Channel Setting Pacing Amplitude: 2.8 V
Lead Channel Setting Pacing Pulse Width: 0.4 ms
Lead Channel Setting Pacing Pulse Width: 1 ms
Lead Channel Setting Sensing Sensitivity: 0.5 mV
Lead Channel Setting Sensing Sensitivity: 1 mV
Pulse Gen Serial Number: 147594

## 2023-10-26 ENCOUNTER — Other Ambulatory Visit (INDEPENDENT_AMBULATORY_CARE_PROVIDER_SITE_OTHER): Payer: Self-pay | Admitting: Primary Care

## 2023-11-05 ENCOUNTER — Other Ambulatory Visit (INDEPENDENT_AMBULATORY_CARE_PROVIDER_SITE_OTHER): Payer: Self-pay | Admitting: Primary Care

## 2023-11-08 NOTE — Telephone Encounter (Signed)
Requested medication (s) are due for refill today: yes  Requested medication (s) are on the active medication list: yes  Last refill:  09/19/23 #30  Future visit scheduled: no  Notes to clinic:  called pt and LM on VM to call pt to make appt- med was previously refused    Requested Prescriptions  Pending Prescriptions Disp Refills   atorvastatin (LIPITOR) 40 MG tablet [Pharmacy Med Name: ATORVASTATIN 40MG  TABLETS] 90 tablet     Sig: TAKE 1 TABLET(40 MG) BY MOUTH DAILY     Cardiovascular:  Antilipid - Statins Failed - 11/05/2023 12:35 PM      Failed - Lipid Panel in normal range within the last 12 months    Cholesterol, Total  Date Value Ref Range Status  02/01/2023 99 (L) 100 - 199 mg/dL Final   LDL Chol Calc (NIH)  Date Value Ref Range Status  02/01/2023 46 0 - 99 mg/dL Final   HDL  Date Value Ref Range Status  02/01/2023 32 (L) >39 mg/dL Final   Triglycerides  Date Value Ref Range Status  02/01/2023 114 0 - 149 mg/dL Final         Passed - Patient is not pregnant      Passed - Valid encounter within last 12 months    Recent Outpatient Visits           9 months ago Prediabetes   [place] [facility] Grayce Sessions, NP   1 year ago Prediabetes   [place] [facility] Grayce Sessions, NP   2 years ago Prediabetes   [place] [facility] Grayce Sessions, NP   2 years ago Prediabetes    [facility] Bing Neighbors, NP   3 years ago Encounter to establish care    [facility] Grayce Sessions, NP

## 2023-11-09 ENCOUNTER — Other Ambulatory Visit (INDEPENDENT_AMBULATORY_CARE_PROVIDER_SITE_OTHER): Payer: Self-pay | Admitting: Primary Care

## 2023-11-09 ENCOUNTER — Ambulatory Visit (INDEPENDENT_AMBULATORY_CARE_PROVIDER_SITE_OTHER): Payer: Medicaid Other | Admitting: Primary Care

## 2023-11-15 ENCOUNTER — Ambulatory Visit (INDEPENDENT_AMBULATORY_CARE_PROVIDER_SITE_OTHER): Payer: Medicaid Other | Admitting: Primary Care

## 2023-11-16 ENCOUNTER — Ambulatory Visit (INDEPENDENT_AMBULATORY_CARE_PROVIDER_SITE_OTHER): Payer: Medicaid Other | Admitting: Primary Care

## 2023-11-16 ENCOUNTER — Encounter (INDEPENDENT_AMBULATORY_CARE_PROVIDER_SITE_OTHER): Payer: Self-pay | Admitting: Primary Care

## 2023-11-16 VITALS — BP 74/49 | HR 64 | Resp 16 | Ht 69.0 in | Wt 232.0 lb

## 2023-11-16 DIAGNOSIS — E785 Hyperlipidemia, unspecified: Secondary | ICD-10-CM

## 2023-11-16 DIAGNOSIS — Z125 Encounter for screening for malignant neoplasm of prostate: Secondary | ICD-10-CM | POA: Diagnosis not present

## 2023-11-16 DIAGNOSIS — Z23 Encounter for immunization: Secondary | ICD-10-CM | POA: Diagnosis not present

## 2023-11-16 DIAGNOSIS — R7303 Prediabetes: Secondary | ICD-10-CM

## 2023-11-16 NOTE — Progress Notes (Signed)
[facility]  Spencer Robertson, is a 62 y.o. male  [id number]  [id number]  DOB - [birth year]  Chief Complaint  Patient presents with   Hypertension   Prediabetes      Sh 1  05/26/23  Subjective:   Spencer Robertson is a 63 y.o. male here today for a follow up visit HTN. Patient has No headache, No chest pain, No abdominal pain - No Nausea, No new weakness tingling or numbness, No Cough - shortness of breath. Prediabetes-You are prediabetic .  Prediabetes is 5.7-6.4 monitor carbohydrates -rice, potatoes, tortillas, breads, pasta, sweets, sodas.  Increase exercising to help maintain appropriate weight.   No problems updated. Comprehensive ROS Pertinent positive and negative noted in HPI   Allergies  Allergen Reactions   Spironolactone Other (See Comments)    gynecomastia    Past Medical History:  Diagnosis Date   CHF (congestive heart failure) (HCC)    Hyperlipidemia    Hypertension    Mitral regurgitation    Nonischemic cardiomyopathy (HCC)    Obesity    Obesity (BMI 30-39.9) 09/01/2015   OSA (obstructive sleep apnea)    severe   S/P implantation of automatic cardioverter/defibrillator (AICD)    Guidant Contact [id number]   Tobacco abuse     Current Outpatient Medications on File Prior to Visit  Medication Sig Dispense Refill   aspirin 81 MG tablet Take 81 mg by mouth daily.     carvedilol (COREG) 25 MG tablet Take 1 tablet (25 mg total) by mouth 2 (two) times daily with a meal. 180 tablet 3   cetirizine (ZYRTEC) 10 MG chewable tablet Chew 10 mg by mouth daily.     dapagliflozin propanediol (FARXIGA) 10 MG TABS tablet TAKE 1 TABLET(10 MG) BY MOUTH DAILY BEFORE AND BREAKFAST 90 tablet 3   eplerenone (INSPRA) 50 MG tablet TAKE 1 TABLET(50 MG) BY MOUTH DAILY 30 tablet 6   furosemide (LASIX) 40 MG tablet Take 1 tablet (40 mg total) by mouth every morning AND 0.5 tablets (20 mg total) every evening. 135 tablet 3   mometasone (NASONEX) 50 MCG/ACT nasal spray Place 2  sprays into the nose as needed.     potassium chloride SA (KLOR-CON M) 20 MEQ tablet TAKE 1 TABLET(20 MEQ) BY MOUTH DAILY 90 tablet 3   sacubitril-valsartan (ENTRESTO) 97-103 MG Take 1 tablet by mouth 2 (two) times daily. 180 tablet 3   sotalol (BETAPACE) 80 MG tablet Take 1 tablet (80 mg total) by mouth 2 (two) times daily. 180 tablet 3   No current facility-administered medications on file prior to visit.   Health Maintenance  Topic Date Due   Zoster (Shingles) Vaccine (1 of 2) Never done   COVID-19 Vaccine (3 - 2024-25 season) 08/07/2023   Cologuard (Stool DNA test)  09/21/2025   DTaP/Tdap/Td vaccine (2 - Td or Tdap) 08/25/2030   Flu Shot  Completed   Hepatitis C Screening  Completed   HIV Screening  Completed   HPV Vaccine  Aged Out    Objective:   Vitals:   11/16/23 1012 11/16/23 1014  BP: (!) 79/52 (!) 74/49  Pulse: 64   Resp: 16   SpO2: 97%   Weight: 232 lb (105.2 kg)   Height: 5\' 9"  (1.753 m)    BP Readings from Last 3 Encounters:  11/16/23 (!) 74/49  09/13/23 98/64  05/31/23 100/64      Physical Exam Vitals reviewed.  Constitutional:      Appearance: He is obese.  HENT:  Head: Normocephalic.     Right Ear: Tympanic membrane and external ear normal.     Left Ear: Tympanic membrane and external ear normal.  Eyes:     Extraocular Movements: Extraocular movements intact.     Pupils: Pupils are equal, round, and reactive to light.  Cardiovascular:     Rate and Rhythm: Normal rate and regular rhythm.  Abdominal:     General: Bowel sounds are normal. There is distension.     Palpations: Abdomen is soft.  Musculoskeletal:        General: Normal range of motion.     Cervical back: Normal range of motion and neck supple.  Skin:    General: Skin is warm and dry.  Neurological:     Mental Status: He is alert and oriented to person, place, and time.  Psychiatric:        Mood and Affect: Mood normal.        Behavior: Behavior normal.      Assessment &  Plan  Mearl was seen today for hypertension and prediabetes.  Diagnoses and all orders for this visit:  Prostate cancer screening -     PSA  Prediabetes -     Hemoglobin A1c 6.2 on dapagliflozin propanediol (FARXIGA) 10 MG TABS tabl .  Prediabetes is 5.7-6.4 monitor carbohydrates -rice, potatoes, tortillas, breads, pasta, sweets, sodas.  Increase exercising to help maintain appropriate weight.   Hyperlipidemia, unspecified hyperlipidemia type -     Lipid panel  Need for shingles vaccine  Encounter for immunization -     Flu vaccine trivalent PF, 6mos and older(Flulaval,Afluria,Fluarix,Fluzone)    Patient have been counseled extensively about nutrition and exercise. Other issues discussed during this visit include: low cholesterol diet, weight control and daily exercise, foot care, annual eye examinations at Ophthalmology, importance of adherence with medications and regular follow-up. We also discussed long term complications of uncontrolled diabetes and hypertension.   Return in about 3 months (around 02/14/2024) for medical conditions.  The patient was given clear instructions to go to ER or return to medical center if symptoms don't improve, worsen or new problems develop. The patient verbalized understanding. The patient was told to call to get lab results if they haven't heard anything in the next week.   This note has been created with Education officer, environmental. Any transcriptional errors are unintentional.   Grayce Sessions, NP 11/22/2023, 9:21 PM

## 2023-11-16 NOTE — Patient Instructions (Signed)

## 2023-11-17 LAB — LIPID PANEL
Chol/HDL Ratio: 4.1 {ratio} (ref 0.0–5.0)
Cholesterol, Total: 159 mg/dL (ref 100–199)
HDL: 39 mg/dL — ABNORMAL LOW (ref >39)
LDL Chol Calc (NIH): 101 mg/dL — ABNORMAL HIGH (ref 0–99)
Triglycerides: 100 mg/dL (ref 0–149)
VLDL Cholesterol Cal: 19 mg/dL (ref 5–40)

## 2023-11-17 LAB — HEMOGLOBIN A1C
Est. average glucose Bld gHb Est-mCnc: 131 mg/dL
Hgb A1c MFr Bld: 6.2 % — ABNORMAL HIGH (ref 4.8–5.6)

## 2023-11-17 LAB — PSA: Prostate Specific Ag, Serum: 1.4 ng/mL (ref 0.0–4.0)

## 2023-11-17 NOTE — Addendum Note (Signed)
Addended by: Geralyn Flash D on: 11/17/2023 10:16 AM   Modules accepted: Orders

## 2023-11-17 NOTE — Progress Notes (Signed)
Remote ICD transmission.   

## 2023-11-21 ENCOUNTER — Other Ambulatory Visit (INDEPENDENT_AMBULATORY_CARE_PROVIDER_SITE_OTHER): Payer: Self-pay | Admitting: Primary Care

## 2023-12-12 ENCOUNTER — Encounter: Payer: Medicaid Other | Admitting: Internal Medicine

## 2023-12-13 ENCOUNTER — Encounter: Payer: Self-pay | Admitting: Internal Medicine

## 2024-01-13 ENCOUNTER — Other Ambulatory Visit (HOSPITAL_COMMUNITY): Payer: Self-pay

## 2024-01-13 DIAGNOSIS — I5022 Chronic systolic (congestive) heart failure: Secondary | ICD-10-CM

## 2024-01-13 MED ORDER — SACUBITRIL-VALSARTAN 97-103 MG PO TABS: 1.0000 | ORAL_TABLET | Freq: Two times a day (BID) | ORAL | 3 refills | Status: DC

## 2024-01-23 ENCOUNTER — Ambulatory Visit (INDEPENDENT_AMBULATORY_CARE_PROVIDER_SITE_OTHER): Payer: Medicaid Other

## 2024-01-23 DIAGNOSIS — I428 Other cardiomyopathies: Secondary | ICD-10-CM

## 2024-01-23 DIAGNOSIS — I5022 Chronic systolic (congestive) heart failure: Secondary | ICD-10-CM

## 2024-01-24 LAB — CUP PACEART REMOTE DEVICE CHECK
Battery Remaining Longevity: 90 mo
Battery Remaining Percentage: 100 %
Brady Statistic RA Percent Paced: 33 %
Brady Statistic RV Percent Paced: 98 %
Date Time Interrogation Session: 20250218050100
HighPow Impedance: 46 Ohm
Implantable Lead Connection Status: 753985
Implantable Lead Connection Status: 753985
Implantable Lead Connection Status: 753985
Implantable Lead Implant Date: 20051122
Implantable Lead Implant Date: 20051122
Implantable Lead Implant Date: 20051122
Implantable Lead Location: 753858
Implantable Lead Location: 753859
Implantable Lead Location: 753860
Implantable Lead Model: 158
Implantable Lead Model: 4194
Implantable Lead Model: 5076
Implantable Lead Serial Number: 159458
Implantable Pulse Generator Implant Date: 20211101
Lead Channel Impedance Value: 461 Ohm
Lead Channel Impedance Value: 534 Ohm
Lead Channel Impedance Value: 881 Ohm
Lead Channel Setting Pacing Amplitude: 2 V
Lead Channel Setting Pacing Amplitude: 2.4 V
Lead Channel Setting Pacing Amplitude: 2.8 V
Lead Channel Setting Pacing Pulse Width: 0.4 ms
Lead Channel Setting Pacing Pulse Width: 1 ms
Lead Channel Setting Sensing Sensitivity: 0.5 mV
Lead Channel Setting Sensing Sensitivity: 1 mV
Pulse Gen Serial Number: 147594

## 2024-01-30 ENCOUNTER — Other Ambulatory Visit (HOSPITAL_COMMUNITY): Payer: Self-pay | Admitting: Cardiology

## 2024-02-03 ENCOUNTER — Encounter (HOSPITAL_COMMUNITY): Payer: Medicaid Other | Admitting: Cardiology

## 2024-02-14 ENCOUNTER — Telehealth (INDEPENDENT_AMBULATORY_CARE_PROVIDER_SITE_OTHER): Payer: Self-pay | Admitting: Primary Care

## 2024-02-14 ENCOUNTER — Ambulatory Visit (INDEPENDENT_AMBULATORY_CARE_PROVIDER_SITE_OTHER): Payer: Medicaid Other | Admitting: Primary Care

## 2024-02-14 NOTE — Telephone Encounter (Signed)
 Called to offer virtual appt. Pt answered and was able to schedule appt because pt wanted to be seen in person.

## 2024-02-14 NOTE — Telephone Encounter (Signed)
Called pt to offer

## 2024-02-27 ENCOUNTER — Other Ambulatory Visit (HOSPITAL_COMMUNITY): Payer: Self-pay

## 2024-02-27 DIAGNOSIS — I5022 Chronic systolic (congestive) heart failure: Secondary | ICD-10-CM

## 2024-02-27 MED ORDER — CARVEDILOL 25 MG PO TABS: 25.0000 mg | ORAL_TABLET | Freq: Two times a day (BID) | ORAL | 3 refills | Status: AC

## 2024-02-29 ENCOUNTER — Ambulatory Visit (INDEPENDENT_AMBULATORY_CARE_PROVIDER_SITE_OTHER): Admitting: Primary Care

## 2024-02-29 NOTE — Progress Notes (Signed)
 Remote ICD transmission.

## 2024-02-29 NOTE — Addendum Note (Signed)
 Addended by: Geralyn Flash D on: 02/29/2024 03:39 PM   Modules accepted: Orders

## 2024-03-02 ENCOUNTER — Telehealth (HOSPITAL_COMMUNITY): Payer: Self-pay

## 2024-03-02 NOTE — Telephone Encounter (Signed)
 Called to confirm/remind patient of their appointment [facility] on 03/05/24.   Appointment:   [] Confirmed  [x] Left mess   [] No answer/No voice mail  [] Phone not in service

## 2024-03-05 ENCOUNTER — Encounter (HOSPITAL_COMMUNITY): Payer: Medicaid Other

## 2024-03-08 ENCOUNTER — Ambulatory Visit (INDEPENDENT_AMBULATORY_CARE_PROVIDER_SITE_OTHER): Admitting: Primary Care

## 2024-03-12 ENCOUNTER — Telehealth (INDEPENDENT_AMBULATORY_CARE_PROVIDER_SITE_OTHER): Payer: Self-pay | Admitting: Primary Care

## 2024-03-12 NOTE — Telephone Encounter (Signed)
 Called pt to confirm appt. Pt will be present.

## 2024-03-20 ENCOUNTER — Ambulatory Visit (INDEPENDENT_AMBULATORY_CARE_PROVIDER_SITE_OTHER): Admitting: Primary Care

## 2024-03-27 ENCOUNTER — Ambulatory Visit (INDEPENDENT_AMBULATORY_CARE_PROVIDER_SITE_OTHER): Admitting: Primary Care

## 2024-03-27 ENCOUNTER — Encounter (INDEPENDENT_AMBULATORY_CARE_PROVIDER_SITE_OTHER): Payer: Self-pay | Admitting: Primary Care

## 2024-03-27 VITALS — BP 96/64 | HR 63 | Resp 16 | Ht 67.0 in | Wt 225.8 lb

## 2024-03-27 DIAGNOSIS — I10 Essential (primary) hypertension: Secondary | ICD-10-CM

## 2024-03-27 DIAGNOSIS — R7303 Prediabetes: Secondary | ICD-10-CM

## 2024-03-27 DIAGNOSIS — E785 Hyperlipidemia, unspecified: Secondary | ICD-10-CM | POA: Diagnosis not present

## 2024-03-27 NOTE — Progress Notes (Signed)
 Subjective:  Patient ID: Spencer Robertson, male    DOB: 18-Dec-1960  Age: 63 y.o. MRN: [id number]  CC: Hypertension and Prediabetes   ANOOP BISSETT presents for Follow-up of diabetes. Patient does not check blood sugar at home Diabetes Compliant with meds - Yes Checking CBGs? No  Fasting avg -   Postprandial average -  Exercising regularly? - Yes Watching carbohydrate intake? - Yes Neuropathy ? - No Hypoglycemic events - No  - Recovers with :   Pertinent ROS:  Polyuria - No Polydipsia - No Vision problems - No  Medications as noted below. Taking them regularly without complication/adverse reaction being reported today.   History Cramer has a past medical history of CHF (congestive heart failure) (HCC), Hyperlipidemia, Hypertension, Mitral regurgitation, Nonischemic cardiomyopathy (HCC), Obesity, Obesity (BMI 30-39.9) (09/01/2015), OSA (obstructive sleep apnea), S/P implantation of automatic cardioverter/defibrillator (AICD), and Tobacco abuse.   He has a past surgical history that includes Cardiac defibrillator placement and BIV ICD GENERATOR CHANGEOUT (N/A, 10/06/2020).   His family history includes Cardiomyopathy in his father; Heart disease in his brother and sister; Hypertension in an other family member.He reports that he has been smoking cigarettes. He has a 5 pack-year smoking history. He has never used smokeless tobacco. He reports current alcohol use. He reports current drug use. Drug: Marijuana.  Current Outpatient Medications on File Prior to Visit  Medication Sig Dispense Refill   aspirin 81 MG tablet Take 81 mg by mouth daily.     atorvastatin  (LIPITOR) 40 MG tablet TAKE 1 TABLET(40 MG) BY MOUTH DAILY 90 tablet 1   carvedilol  (COREG ) 25 MG tablet Take 1 tablet (25 mg total) by mouth 2 (two) times daily with a meal. 180 tablet 3   cetirizine (ZYRTEC) 10 MG chewable tablet Chew 10 mg by mouth daily.     dapagliflozin  propanediol (FARXIGA ) 10 MG TABS tablet TAKE 1  TABLET BY MOUTH EVERY DAY WITH BREAKFAST 90 tablet 3   eplerenone  (INSPRA ) 50 MG tablet TAKE 1 TABLET(50 MG) BY MOUTH DAILY 30 tablet 6   furosemide  (LASIX ) 40 MG tablet Take 1 tablet (40 mg total) by mouth every morning AND 0.5 tablets (20 mg total) every evening. 135 tablet 3   mometasone  (NASONEX ) 50 MCG/ACT nasal spray Place 2 sprays into the nose as needed.     potassium chloride  SA (KLOR-CON  M) 20 MEQ tablet TAKE 1 TABLET(20 MEQ) BY MOUTH DAILY 90 tablet 3   sacubitril -valsartan  (ENTRESTO ) 97-103 MG Take 1 tablet by mouth 2 (two) times daily. 180 tablet 3   sotalol  (BETAPACE ) 80 MG tablet Take 1 tablet (80 mg total) by mouth 2 (two) times daily. 180 tablet 3   No current facility-administered medications on file prior to visit.    Review of Systems Comprehensive ROS Pertinent positive and negative noted in HPI   Objective:  BP 96/64   Pulse 63   Resp 16   Ht 5\' 7"  (1.702 m)   Wt 225 lb 12.8 oz (102.4 kg)   SpO2 99%   BMI 35.37 kg/m   BP Readings from Last 3 Encounters:  03/27/24 96/64  11/16/23 (!) 74/49  09/13/23 98/64    Wt Readings from Last 3 Encounters:  03/27/24 225 lb 12.8 oz (102.4 kg)  11/16/23 232 lb (105.2 kg)  09/13/23 236 lb 6.4 oz (107.2 kg)    Physical Exam Vitals reviewed.  Constitutional:      Appearance: He is obese.  HENT:     Head: Normocephalic.  Right Ear: Tympanic membrane and external ear normal.     Left Ear: Tympanic membrane and external ear normal.     Nose: Nose normal.  Eyes:     Extraocular Movements: Extraocular movements intact.     Pupils: Pupils are equal, round, and reactive to light.  Cardiovascular:     Rate and Rhythm: Normal rate and regular rhythm.  Pulmonary:     Effort: Pulmonary effort is normal.     Breath sounds: Normal breath sounds.  Abdominal:     General: Bowel sounds are normal. There is distension.     Palpations: Abdomen is soft.  Musculoskeletal:        General: Normal range of motion.  Skin:     General: Skin is warm and dry.  Neurological:     Mental Status: He is oriented to person, place, and time.  Psychiatric:        Mood and Affect: Mood normal.        Behavior: Behavior normal.        Thought Content: Thought content normal.        Judgment: Judgment normal.     Lab Results  Component Value Date   HGBA1C 6.2 (H) 11/16/2023   HGBA1C 6.5 (H) 02/01/2023   HGBA1C 6.3 (A) 05/26/2022    Lab Results  Component Value Date   WBC 4.5 02/01/2023   HGB 16.1 02/01/2023   HCT 46.6 02/01/2023   PLT 140 (L) 02/01/2023   GLUCOSE 100 (H) 09/13/2023   CHOL 159 11/16/2023   TRIG 100 11/16/2023   HDL 39 (L) 11/16/2023   LDLCALC 101 (H) 11/16/2023   ALT 13 06/23/2020   AST 17 06/23/2020   NA 136 09/13/2023   K 4.6 09/13/2023   CL 101 09/13/2023   CREATININE 1.48 (H) 09/13/2023   BUN 29 (H) 09/13/2023   CO2 27 09/13/2023   INR 1.1 ratio (H) 07/31/2010   HGBA1C 6.2 (H) 11/16/2023     Assessment & Plan:  Nijay was seen today for hypertension and prediabetes.  Diagnoses and all orders for this visit:  Prediabetes - educated on lifestyle modifications, including but not limited to diet choices and adding exercise to daily routine.   -     Hemoglobin A1c -     Lipid panel  Hyperlipidemia, unspecified hyperlipidemia type -     CMP14+EGFR -     Lipid panel  Benign essential HTN Nonischemic cardiomyopathy managed by cardiology  -     CBC with Differential/Platelet -     CMP14+EGFR  Follow-up:  Return in about 6 months (around 09/26/2024).  The above assessment and management plan was discussed with the patient. The patient verbalized understanding of and has agreed to the management plan. Patient is aware to call the clinic if symptoms fail to improve or worsen. Patient is aware when to return to the clinic for a follow-up visit. Patient educated on when it is appropriate to go to the emergency department.   Madelyn Schick, NP-C

## 2024-03-28 ENCOUNTER — Other Ambulatory Visit (HOSPITAL_COMMUNITY): Payer: Self-pay

## 2024-03-28 DIAGNOSIS — I5022 Chronic systolic (congestive) heart failure: Secondary | ICD-10-CM

## 2024-03-28 LAB — CBC WITH DIFFERENTIAL/PLATELET
Basophils Absolute: 0 10*3/uL (ref 0.0–0.2)
Basos: 1 %
EOS (ABSOLUTE): 0.2 10*3/uL (ref 0.0–0.4)
Eos: 4 %
Hematocrit: 48.2 % (ref 37.5–51.0)
Hemoglobin: 15.9 g/dL (ref 13.0–17.7)
Immature Grans (Abs): 0 10*3/uL (ref 0.0–0.1)
Immature Granulocytes: 0 %
Lymphocytes Absolute: 1.6 10*3/uL (ref 0.7–3.1)
Lymphs: 35 %
MCH: 31.4 pg (ref 26.6–33.0)
MCHC: 33 g/dL (ref 31.5–35.7)
MCV: 95 fL (ref 79–97)
Monocytes Absolute: 0.5 10*3/uL (ref 0.1–0.9)
Monocytes: 11 %
Neutrophils Absolute: 2.2 10*3/uL (ref 1.4–7.0)
Neutrophils: 49 %
Platelets: 124 10*3/uL — ABNORMAL LOW (ref 150–450)
RBC: 5.06 x10E6/uL (ref 4.14–5.80)
RDW: 15.3 % (ref 11.6–15.4)
WBC: 4.6 10*3/uL (ref 3.4–10.8)

## 2024-03-28 LAB — CMP14+EGFR
ALT: 38 IU/L (ref 0–44)
AST: 42 IU/L — ABNORMAL HIGH (ref 0–40)
Albumin: 4 g/dL (ref 3.9–4.9)
Alkaline Phosphatase: 70 IU/L (ref 44–121)
BUN/Creatinine Ratio: 15 (ref 10–24)
BUN: 19 mg/dL (ref 8–27)
Bilirubin Total: 0.7 mg/dL (ref 0.0–1.2)
CO2: 23 mmol/L (ref 20–29)
Calcium: 9 mg/dL (ref 8.6–10.2)
Chloride: 104 mmol/L (ref 96–106)
Creatinine, Ser: 1.23 mg/dL (ref 0.76–1.27)
Globulin, Total: 2.6 g/dL (ref 1.5–4.5)
Glucose: 94 mg/dL (ref 70–99)
Potassium: 4.5 mmol/L (ref 3.5–5.2)
Sodium: 139 mmol/L (ref 134–144)
Total Protein: 6.6 g/dL (ref 6.0–8.5)
eGFR: 66 mL/min/{1.73_m2} (ref >59)

## 2024-03-28 LAB — HEMOGLOBIN A1C
Est. average glucose Bld gHb Est-mCnc: 134 mg/dL
Hgb A1c MFr Bld: 6.3 % — ABNORMAL HIGH (ref 4.8–5.6)

## 2024-03-28 LAB — LIPID PANEL
Chol/HDL Ratio: 2.4 ratio (ref 0.0–5.0)
Cholesterol, Total: 100 mg/dL (ref 100–199)
HDL: 41 mg/dL (ref >39)
LDL Chol Calc (NIH): 46 mg/dL (ref 0–99)
Triglycerides: 52 mg/dL (ref 0–149)
VLDL Cholesterol Cal: 13 mg/dL (ref 5–40)

## 2024-03-28 MED ORDER — FUROSEMIDE 40 MG PO TABS: ORAL_TABLET | ORAL | 3 refills | Status: AC

## 2024-03-28 MED ORDER — EPLERENONE 50 MG PO TABS: ORAL_TABLET | ORAL | 6 refills | Status: DC

## 2024-04-02 ENCOUNTER — Other Ambulatory Visit (INDEPENDENT_AMBULATORY_CARE_PROVIDER_SITE_OTHER): Payer: Self-pay | Admitting: Primary Care

## 2024-04-02 DIAGNOSIS — D696 Thrombocytopenia, unspecified: Secondary | ICD-10-CM

## 2024-04-18 ENCOUNTER — Telehealth (HOSPITAL_COMMUNITY): Payer: Self-pay

## 2024-04-18 MED ORDER — SOTALOL HCL 80 MG PO TABS: 80.0000 mg | ORAL_TABLET | Freq: Two times a day (BID) | ORAL | 3 refills | Status: AC

## 2024-04-18 NOTE — Telephone Encounter (Signed)
 Refill sent to pharmacy.

## 2024-04-19 ENCOUNTER — Telehealth (HOSPITAL_COMMUNITY): Payer: Self-pay | Admitting: *Deleted

## 2024-04-19 NOTE — Telephone Encounter (Signed)
 Called to confirm/remind patient of their appointment at [facility] on 04/20/24.      Appointment:              [x] Confirmed             [] Left mess              [] No answer/No voice mail             [] Phone not in service   Patient reminded to bring all medications and/or complete list.   Confirmed patient has transportation. Gave directions, instructed to utilize valet parking.

## 2024-04-20 ENCOUNTER — Encounter (HOSPITAL_COMMUNITY): Payer: Self-pay

## 2024-04-20 ENCOUNTER — Ambulatory Visit (HOSPITAL_COMMUNITY)
Admission: RE | Admit: 2024-04-20 | Discharge: 2024-04-20 | Disposition: A | Discharge status: Home / Self Care | Source: Ambulatory Visit | Attending: Family Medicine | Admitting: Family Medicine

## 2024-04-20 VITALS — BP 110/68 | HR 69 | Ht 70.0 in | Wt 221.8 lb

## 2024-04-20 DIAGNOSIS — Z8249 Family history of ischemic heart disease and other diseases of the circulatory system: Secondary | ICD-10-CM | POA: Diagnosis not present

## 2024-04-20 DIAGNOSIS — I5022 Chronic systolic (congestive) heart failure: Secondary | ICD-10-CM | POA: Diagnosis present

## 2024-04-20 DIAGNOSIS — G4731 Primary central sleep apnea: Secondary | ICD-10-CM | POA: Diagnosis not present

## 2024-04-20 DIAGNOSIS — I340 Nonrheumatic mitral (valve) insufficiency: Secondary | ICD-10-CM | POA: Insufficient documentation

## 2024-04-20 DIAGNOSIS — I4719 Other supraventricular tachycardia: Secondary | ICD-10-CM | POA: Diagnosis not present

## 2024-04-20 DIAGNOSIS — Z95810 Presence of automatic (implantable) cardiac defibrillator: Secondary | ICD-10-CM | POA: Insufficient documentation

## 2024-04-20 DIAGNOSIS — F17210 Nicotine dependence, cigarettes, uncomplicated: Secondary | ICD-10-CM | POA: Diagnosis not present

## 2024-04-20 DIAGNOSIS — Z79899 Other long term (current) drug therapy: Secondary | ICD-10-CM | POA: Insufficient documentation

## 2024-04-20 DIAGNOSIS — I428 Other cardiomyopathies: Secondary | ICD-10-CM | POA: Diagnosis not present

## 2024-04-20 NOTE — Patient Instructions (Signed)
 No medication changes today. Please schedule follow up with [facility] for your Bi pap mask issues - see contact info below. Return to see Dr. Mitzie Anda with an echo in 3 - 4 months. PLEASE CALL US  AT [phone] IN AUGUST TO SCHEDULE THIS APPOINTMENT. Please call us  at [phone] if any questions or concern prior to your next appointment.

## 2024-04-20 NOTE — Progress Notes (Signed)
 Cardiology: Dr. Rodolfo Clan HF Cardiology: Dr. Mitzie Anda  63 y.o. with history of nonischemic cardiomyopathy with a long history of cardiomyopathy.  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  Echo in 7/18 showed EF 20-25% with diffuse hypokinesis and dilated RV.  He has a strong family history of CHF (father, sister, half-brother).  Sleep study in 12/18 with severe central sleep apnea, now on Bipap.  Echo in 8/19 showed EF 20-25%, moderate MR, mildly decreased RV systolic function.    Echo in 11/20 showed EF 25-30%, mildly decreased RV systolic function.  Echo in 3/22 showed EF 25-30%, diffuse hypokinesis, mildly decreased RV systolic function.   Echo in 5/23 showed EF 30-35%, diffuse hypokinesis, mildly decreased RV systolic function.  Echo 6/24 EF 20-25%, diffuse hypokinesis, moderate LV dilation, mildly decreased RV systolic function, moderate MR.   Today he returns for HF follow up. Overall feeling fine. Enjoys fishing. No SOB with activity or work duties. 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 Denies increasing SOB, palpitations, abnormal bleeding, CP, dizziness, edema, or PND/Orthopnea. Appetite ok. No fever or chills. Weight at home 210 pounds. Taking all medications. Wears BiPap 5/50, having trouble with mask. Smokes 6 cigs/day, occasional THC and ETOH.  ECG (personally reviewed): BiV pacing, A sensed, V paced 62 bpm  Device interrogation (personally reviewed): HL Score 0, 96% BiV pacing, 3.2 hr/day activity, average HR 74 bpm  Labs (2/24): K 3.8, creatinine 1.22, BNP 118, hgb 16.1 Labs (6/24): K 4.0, creatinine 1.15 Labs (12/24): LDL 101 Labs (4/25): K 4.5, creatinine 1.23, LDL 46  PMH: 1. Central sleep apnea: Uses Bipap.  2. LBBB: s/p [employer] CRT-D.   3. He is on sotalol  due to high DFT.  4. History of atrial tachycardia 5. Active smoker 6. Chronic systolic CHF: nonischemic cardiomyopathy.  Known since prior to 2005.  [employer] CRT-D.  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  - Echo (7/18): EF 20-25%, severe dilated LV, diffuse hypokinesis, severe RV dilation with low normal systolic function, moderate TR, PASP 52 mmHg.  - Gynecomastia with spironolactone .  - Echo (8/19): EF 20-25%, moderate MR, mildly decreased RV systolic function.  - CPX (9/19): peak VO2 14.4, VE/VCO2 slope 40, RER 1.08.  Moderate functional limitation due to HF and body habitus.  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  - Echo (11/20): EF 25-30%, mildly decreased RV systolic function.  - Echo (3/22): EF 25-30%, diffuse hypokinesis, mildly decreased RV systolic function.  - Echo (5/23): EF 30-35%, diffuse hypokinesis, mildly decreased RV systolic function. - Echo (6/24): EF 20-25%, diffuse hypokinesis, moderate LV dilation, mildly decreased RV systolic function, moderate MR.   SH: Smokes 1/2 ppd, rare ETOH, occasional marijuana.   Lives with friend in [place].   FH: Father with cardiomyopathy, 1/2 brother died from CHF, sister with CHF.   ROS: All systems reviewed and negative except as per HPI.   Current Outpatient Medications  Medication Sig Dispense Refill   aspirin 81 MG tablet Take 81 mg by mouth daily.     atorvastatin  (LIPITOR) 40 MG tablet TAKE 1 TABLET(40 MG) BY MOUTH DAILY 90 tablet 1   carvedilol  (COREG ) 25 MG tablet Take 1 tablet (25 mg total) by mouth 2 (two) times daily with a meal. 180 tablet 3   cetirizine (ZYRTEC) 10 MG chewable tablet Chew 10 mg by mouth daily.     dapagliflozin  propanediol (FARXIGA ) 10 MG TABS tablet TAKE 1 TABLET BY MOUTH EVERY DAY WITH BREAKFAST 90 tablet  3   eplerenone  (INSPRA ) 50 MG tablet TAKE 1 TABLET(50 MG) BY MOUTH DAILY 30 tablet 6   furosemide  (LASIX ) 40 MG tablet Take 1 tablet (40 mg total) by mouth every morning AND 0.5 tablets (20 mg total) every evening. 135 tablet 3   mometasone  (NASONEX ) 50 MCG/ACT nasal spray Place 2 sprays into the nose as needed.     potassium chloride  SA (KLOR-CON  M) 20 MEQ  tablet TAKE 1 TABLET(20 MEQ) BY MOUTH DAILY 90 tablet 3   sacubitril -valsartan  (ENTRESTO ) 97-103 MG Take 1 tablet by mouth 2 (two) times daily. 180 tablet 3   sotalol  (BETAPACE ) 80 MG tablet Take 1 tablet (80 mg total) by mouth 2 (two) times daily. 180 tablet 3   No current facility-administered medications for this encounter.   Wt Readings from Last 3 Encounters:  04/20/24 100.6 kg (221 lb 12.8 oz)  03/27/24 102.4 kg (225 lb 12.8 oz)  11/16/23 105.2 kg (232 lb)   BP 110/68   Pulse 69   Ht 5\' 10"  (1.778 m)   Wt 100.6 kg (221 lb 12.8 oz)   SpO2 96%   BMI 31.82 kg/m   Physical Exam General:  NAD. No resp difficulty, walked into clinic HEENT: Normal Neck: Supple. No JVD. Cor: Regular rate & rhythm. No rubs, gallops or murmurs. Lungs: Clear Abdomen: Soft, obese, nontender, nondistended.  Extremities: No cyanosis, clubbing, rash, edema Neuro: Alert & oriented x 3, moves all 4 extremities w/o difficulty. Affect pleasant.  Assessment/Plan: 1. Chronic systolic CHF: Nonischemic cardiomyopathy x years.  [employer] CRT-D device.  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  Echo in 8/19 with EF 20-25%, echo in 11/20 showed EF 25-30%, echo in 5/23 showed EF 30-35%, echo 6/24 showed EF 20-25% with mild RV dysfunction.  CPX in 9/19 showed moderate functional limitation from heart failure.  He saw Dr. 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  He is not volume overloaded on exam, NYHA class I symptoms. BP runs low at times, but no orthostatic symptoms and normal creatinine.  - Continue Lasix  40 qam/20 qpm + 20 KCL daily. Recent labs reviewed and are stable - Continue dapagliflozin  10 mg daily.  No GU symptoms. - Continue Entresto  97/103 bid.  - Continue Coreg  25 mg bid.  - Continue eplerenone  50 mg daily. Intolerant spiro due to gynecomastia.     - BP too low to add Bidil .   - Taking sotalol  b/c of high  DFTs.  ECG showed paced QTc 529 msec (keep paced QTc < 550 msec).  - If he becomes more symptomatic in the future, consider barostimulation activation therapy.  - Update echo next visit. 2. Smoking: Discussed smoking cessation again.   - Bupropion  did not help. He would like to try to quit on his own. 3. Atrial Tachycardia: He is on sotalol .  4. Central sleep apnea: Encouraged compliance with BiPap - Refer back to Pulm to discuss mask option.  Follow up in 3-4 months with Dr. Mitzie Anda + echo  Arlice Bene [facility] FNP-BC 04/20/2024

## 2024-04-23 ENCOUNTER — Ambulatory Visit (HOSPITAL_COMMUNITY): Payer: Self-pay | Admitting: Family Medicine

## 2024-04-23 ENCOUNTER — Ambulatory Visit (INDEPENDENT_AMBULATORY_CARE_PROVIDER_SITE_OTHER): Payer: Medicaid Other

## 2024-04-23 DIAGNOSIS — I428 Other cardiomyopathies: Secondary | ICD-10-CM

## 2024-04-23 DIAGNOSIS — I5022 Chronic systolic (congestive) heart failure: Secondary | ICD-10-CM

## 2024-04-24 LAB — CUP PACEART REMOTE DEVICE CHECK
Battery Remaining Longevity: 90 mo
Battery Remaining Percentage: 100 %
Brady Statistic RA Percent Paced: 32 %
Brady Statistic RV Percent Paced: 98 %
Date Time Interrogation Session: 20250516135900
HighPow Impedance: 44 Ohm
Implantable Lead Connection Status: 753985
Implantable Lead Connection Status: 753985
Implantable Lead Connection Status: 753985
Implantable Lead Implant Date: 20051122
Implantable Lead Implant Date: 20051122
Implantable Lead Implant Date: 20051122
Implantable Lead Location: 753858
Implantable Lead Location: 753859
Implantable Lead Location: 753860
Implantable Lead Model: 158
Implantable Lead Model: 4194
Implantable Lead Model: 5076
Implantable Lead Serial Number: 159458
Implantable Pulse Generator Implant Date: 20211101
Lead Channel Impedance Value: 431 Ohm
Lead Channel Impedance Value: 511 Ohm
Lead Channel Impedance Value: 772 Ohm
Lead Channel Setting Pacing Amplitude: 2 V
Lead Channel Setting Pacing Amplitude: 2.4 V
Lead Channel Setting Pacing Amplitude: 2.8 V
Lead Channel Setting Pacing Pulse Width: 0.4 ms
Lead Channel Setting Pacing Pulse Width: 1 ms
Lead Channel Setting Sensing Sensitivity: 0.5 mV
Lead Channel Setting Sensing Sensitivity: 1 mV
Pulse Gen Serial Number: 147594

## 2024-04-30 ENCOUNTER — Ambulatory Visit: Payer: Self-pay | Admitting: Cardiovascular Disease

## 2024-05-08 ENCOUNTER — Telehealth: Payer: Self-pay

## 2024-05-08 ENCOUNTER — Inpatient Hospital Stay

## 2024-05-08 ENCOUNTER — Inpatient Hospital Stay: Admitting: Oncology

## 2024-05-08 NOTE — Telephone Encounter (Signed)
 Spoke with pt just now regarding his missed 2pm appt with Dr. Randye Buttner and [facility]. He said he was not coming and was at work and asked to be rescheduled. Reviewed with pt that he can check his "MyChart" for future appts and also is text messages on his phone for appt reminders. Scheduler aware and will reach out to reschedule.

## 2024-05-11 ENCOUNTER — Inpatient Hospital Stay

## 2024-05-11 ENCOUNTER — Telehealth: Payer: Self-pay

## 2024-05-11 ENCOUNTER — Other Ambulatory Visit

## 2024-05-11 ENCOUNTER — Inpatient Hospital Stay: Admitting: Oncology

## 2024-05-11 NOTE — Telephone Encounter (Signed)
 Spoke with pt via phone call to follow up on 2nd missed [place] Hem Appt with Dr. Randye Buttner at [facility]. Patient said he was unaware of appt. Informed pt to check his text messages and "MyChart."  Reached out to [employer] via [employer] to reschedule with another Hem Provider late in the day on a Tues or Thurs per pt preference. Dr. Wende Halo schedule does not allow for later appt on Thurs and pt unwilling to travel to [facility] on a Tuesday.

## 2024-05-18 ENCOUNTER — Encounter: Payer: Self-pay | Admitting: Cardiology

## 2024-05-29 ENCOUNTER — Ambulatory Visit: Attending: Cardiovascular Disease | Admitting: Cardiovascular Disease

## 2024-05-29 VITALS — BP 90/66 | HR 69 | Ht 68.0 in | Wt 223.0 lb

## 2024-05-29 DIAGNOSIS — I4719 Other supraventricular tachycardia: Secondary | ICD-10-CM | POA: Diagnosis present

## 2024-05-29 NOTE — Progress Notes (Signed)
  Electrophysiology Office Note:    Date:  05/29/2024   ID:  Jemal, Miskell 1961-01-08, MRN [id number]  PCP:  Celestia Rosaline SQUIBB, NP   [facility] Providers Cardiologist:  None Electrophysiologist:  Elspeth Sage, MD  Sleep Medicine:  Wilbert Bihari, MD     Referring MD: Celestia Rosaline SQUIBB, NP   History of Present Illness:    Spencer Robertson is a 63 y.o. male with a medical history significant for atrial tachycardia, chronic CHF with reduced ejection fraction.  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      Discussed the use of AI scribe software for clinical note transcription with the patient, who gave verbal consent to proceed.  History of Present Illness  The patient has a long history of nonischemic cardiomyopathy and left bundle branch block for which a [employer] CRT-D was placed in 2005.  He is been followed by Dr. Sage.  He has a strong family history of CHF, affecting his father, sister, half-brother, so a familial cardiomyopathy has been suspected.        Today, he has no acute complaints. he has no device related complaints -- no new tenderness, drainage, redness.   EKGs/Labs/Other Studies Reviewed Today:     Echocardiogram:  TTE June 2024 LVEF 20 to 25%.  Grade 1 diastolic dysfunction.  Mildly dilated left and right atria.   EKG:         Physical Exam:    VS:  BP 90/66   Pulse 69   Ht 5' 8 (1.727 m)   Wt 223 lb (101.2 kg)   SpO2 96%   BMI 33.91 kg/m     Wt Readings from Last 3 Encounters:  05/29/24 223 lb (101.2 kg)  04/20/24 221 lb 12.8 oz (100.6 kg)  03/27/24 225 lb 12.8 oz (102.4 kg)     GEN: Well nourished, well developed in no acute distress CARDIAC: RRR, no murmurs, rubs, gallops The device site is normal -- no tenderness, edema, drainage, redness, threatened erosion.  RESPIRATORY:  Normal work of breathing MUSCULOSKELETAL: no edema    ASSESSMENT & PLAN:      Environmental manager CRT-D I reviewed today's device interrogation.  See Paceart for details Device is functioning normally He is not device dependent He is on sotalol  doing part for improved DFTs  Atrial tachycardia No events on interrogation today On sotalol  80 mg twice daily EKG, labs April 22 reviewed  Chronic systolic CHF Appears euvolemic and compensated today Continue dapagliflozin  10 mg, Entresto  97-103 twice daily, Coreg  25 mg twice daily, BiDil     Signed, Eulas FORBES Furbish, MD  05/29/2024 3:54 PM    [facility]

## 2024-05-29 NOTE — Patient Instructions (Signed)
 Medication Instructions:  Your physician recommends that you continue on your current medications as directed. Please refer to the Current Medication list given to you today. *If you need a refill on your cardiac medications before your next appointment, please call your pharmacy*   Follow-Up: At [facility], you and your health needs are our priority.  As part of our continuing mission to provide you with exceptional heart care, our providers are all part of one team.  This team includes your primary Cardiologist (physician) and Advanced Practice Providers or APPs (Physician Assistants and Nurse Practitioners) who all work together to provide you with the care you need, when you need it.  Your next appointment:   1 year(s)  Provider:   You will see one of the following Advanced Practice Providers on your designated Care Team:   Mertha Abrahams, Kennard Pea [address]" [place], PA-C Suzann Riddle, NP Creighton Doffing, NP

## 2024-06-04 ENCOUNTER — Inpatient Hospital Stay

## 2024-06-04 ENCOUNTER — Encounter: Payer: Self-pay | Admitting: Hematology and Oncology

## 2024-06-04 ENCOUNTER — Other Ambulatory Visit (INDEPENDENT_AMBULATORY_CARE_PROVIDER_SITE_OTHER): Payer: Self-pay

## 2024-06-04 ENCOUNTER — Inpatient Hospital Stay: Attending: Hematology and Oncology | Admitting: Hematology and Oncology

## 2024-06-04 ENCOUNTER — Ambulatory Visit (HOSPITAL_COMMUNITY)
Admission: RE | Admit: 2024-06-04 | Discharge: 2024-06-04 | Disposition: A | Discharge status: Home / Self Care | Source: Ambulatory Visit | Attending: Hematology and Oncology

## 2024-06-04 VITALS — BP 97/62 | HR 58 | Temp 97.4°F | Resp 18 | Ht 68.0 in | Wt 223.2 lb

## 2024-06-04 DIAGNOSIS — I420 Dilated cardiomyopathy: Secondary | ICD-10-CM

## 2024-06-04 DIAGNOSIS — K7460 Unspecified cirrhosis of liver: Secondary | ICD-10-CM | POA: Diagnosis not present

## 2024-06-04 DIAGNOSIS — K7581 Nonalcoholic steatohepatitis (NASH): Secondary | ICD-10-CM | POA: Diagnosis not present

## 2024-06-04 DIAGNOSIS — F17210 Nicotine dependence, cigarettes, uncomplicated: Secondary | ICD-10-CM | POA: Diagnosis not present

## 2024-06-04 DIAGNOSIS — R0989 Other specified symptoms and signs involving the circulatory and respiratory systems: Secondary | ICD-10-CM | POA: Insufficient documentation

## 2024-06-04 DIAGNOSIS — Z720 Tobacco use: Secondary | ICD-10-CM

## 2024-06-04 DIAGNOSIS — D696 Thrombocytopenia, unspecified: Secondary | ICD-10-CM | POA: Diagnosis present

## 2024-06-04 LAB — CBC WITH DIFFERENTIAL (CANCER CENTER ONLY)
Abs Immature Granulocytes: 0.01 10*3/uL (ref 0.00–0.07)
Basophils Absolute: 0.1 10*3/uL (ref 0.0–0.1)
Basophils Relative: 1 %
Eosinophils Absolute: 0.3 10*3/uL (ref 0.0–0.5)
Eosinophils Relative: 5 %
HCT: 45.6 % (ref 39.0–52.0)
Hemoglobin: 16.1 g/dL (ref 13.0–17.0)
Immature Granulocytes: 0 %
Lymphocytes Relative: 39 %
Lymphs Abs: 1.9 10*3/uL (ref 0.7–4.0)
MCH: 31.3 pg (ref 26.0–34.0)
MCHC: 35.3 g/dL (ref 30.0–36.0)
MCV: 88.7 fL (ref 80.0–100.0)
Monocytes Absolute: 0.6 10*3/uL (ref 0.1–1.0)
Monocytes Relative: 13 %
Neutro Abs: 2 10*3/uL (ref 1.7–7.7)
Neutrophils Relative %: 42 %
Platelet Count: 135 10*3/uL — ABNORMAL LOW (ref 150–400)
RBC: 5.14 MIL/uL (ref 4.22–5.81)
RDW: 15.4 % (ref 11.5–15.5)
WBC Count: 4.8 10*3/uL (ref 4.0–10.5)
nRBC: 0 % (ref 0.0–0.2)

## 2024-06-04 LAB — CMP (CANCER CENTER ONLY)
ALT: 12 U/L (ref 0–44)
AST: 19 U/L (ref 15–41)
Albumin: 4.2 g/dL (ref 3.5–5.0)
Alkaline Phosphatase: 50 U/L (ref 38–126)
Anion gap: 7 (ref 5–15)
BUN: 14 mg/dL (ref 8–23)
CO2: 27 mmol/L (ref 22–32)
Calcium: 9.4 mg/dL (ref 8.9–10.3)
Chloride: 103 mmol/L (ref 98–111)
Creatinine: 1.14 mg/dL (ref 0.61–1.24)
GFR, Estimated: >60 mL/min (ref >60)
Glucose, Bld: 100 mg/dL — ABNORMAL HIGH (ref 70–99)
Potassium: 3.9 mmol/L (ref 3.5–5.1)
Sodium: 137 mmol/L (ref 135–145)
Total Bilirubin: 1.1 mg/dL (ref 0.0–1.2)
Total Protein: 7.6 g/dL (ref 6.5–8.1)

## 2024-06-04 LAB — VITAMIN B12: Vitamin B-12: 191 pg/mL (ref 180–914)

## 2024-06-04 LAB — SEDIMENTATION RATE: Sed Rate: 3 mm/h (ref 0–16)

## 2024-06-04 MED ORDER — ATORVASTATIN CALCIUM 40 MG PO TABS: 40.0000 mg | ORAL_TABLET | Freq: Every day | ORAL | 1 refills | Status: DC

## 2024-06-04 NOTE — Assessment & Plan Note (Signed)
 The patient is known to have dilated cardiomyopathy His examination showed bilateral crackles I will order chest x-ray today

## 2024-06-04 NOTE — Assessment & Plan Note (Signed)
 I suspect the cause of his thrombocytopenia is related to nonalcoholic steatohepatitis causing liver cirrhosis I have reviewed his prior imaging from 2021 which shows signs of liver cirrhosis although not mentioned in his report The patient has not been symptomatic As long as his platelet count remain over 100,000, he does not need excessive workup of follow-up and there is no contraindication for him to continue on anticoagulation therapy or antiplatelet agents

## 2024-06-04 NOTE — Assessment & Plan Note (Signed)
 The patient used to be profoundly overweight He has history of abnormal liver enzymes and I suspect he might have nonalcoholic steatosis causing liver cirrhosis His prior CT imaging from 2021 shows some signs of liver cirrhosis We discussed importance of weight loss and risk factor modification

## 2024-06-04 NOTE — Progress Notes (Signed)
 [facility] CONSULT NOTE  Patient Care Team: Celestia Rosaline SQUIBB, NP as PCP - General (Internal Medicine) Shlomo Wilbert SAUNDERS, MD as PCP - Sleep Medicine (Cardiology) Mealor, Eulas BRAVO, MD as PCP - Electrophysiology (Cardiology)  ASSESSMENT & PLAN Thrombocytopenia [facility]) I suspect the cause of his thrombocytopenia is related to nonalcoholic steatohepatitis causing liver cirrhosis I have reviewed his prior imaging from 2021 which shows signs of liver cirrhosis although not mentioned in his report The patient has not been symptomatic As long as his platelet count remain over 100,000, he does not need excessive workup of follow-up and there is no contraindication for him to continue on anticoagulation therapy or antiplatelet agents  Liver cirrhosis secondary to NASH (nonalcoholic steatohepatitis) (HCC) The patient used to be profoundly overweight He has history of abnormal liver enzymes and I suspect he might have nonalcoholic steatosis causing liver cirrhosis His prior CT imaging from 2021 shows some signs of liver cirrhosis We discussed importance of weight loss and risk factor modification  Dilated cardiomyopathy (HCC) The patient is known to have dilated cardiomyopathy His examination showed bilateral crackles I will order chest x-ray today  Tobacco abuse He has been smoking since he was a teenager, as high as half a pack of cigarettes per day for a long time Recently, he has reduced his smoking to about 5 cigarettes/day He has never had screening chest CT and due to abnormal imaging, I recommend CT imaging of his chest for evaluation  Orders Placed This Encounter  Procedures   DG Chest 2 View    Standing Status:   Future    Number of Occurrences:   1    Expected Date:   06/04/2024    Expiration Date:   06/04/2025    Reason for exam::   chronic smoker, bilateral lung crackles    Preferred imaging location?:   [facility]   CT CHEST LUNG CA SCREEN LOW DOSE W/O  CM    Standing Status:   Future    Expected Date:   06/11/2024    Expiration Date:   06/04/2025    Preferred Imaging Location?:   [facility]   CBC with Differential (Cancer Center Only)    Standing Status:   Future    Number of Occurrences:   1    Expiration Date:   06/04/2025   Vitamin B12    Standing Status:   Future    Number of Occurrences:   1    Expiration Date:   06/04/2025   Sedimentation rate    Standing Status:   Future    Number of Occurrences:   1    Expiration Date:   06/04/2025   CMP (Cancer Center only)    Standing Status:   Future    Number of Occurrences:   1    Expiration Date:   06/04/2025   All questions were answered. The patient knows to call the clinic with any problems, questions or concerns. No barriers to learning was detected. The total time spent in the appointment was 60 minutes encounter with patients including review of chart and various tests results, discussions about plan of care and coordination of care plan  Almarie Bedford, MD 06/04/2024 11:06 AM  CHIEF COMPLAINTS/PURPOSE OF CONSULTATION:  Chronic thrombocytopenia  HISTORY OF PRESENTING ILLNESS:  Spencer Robertson 63 y.o. male is here because of thrombocytopenia.  He was found to have abnormal CBC from recent blood work I have the opportunity to review his  CBC dated back to 2008 Starting in October 2008, he was noted to have platelet count as well as 142 Over the last few years, his platelet count fluctuate up and down but typically well above 100,000 He used to be morbidly obese, weighing over 300 pounds but has lost some weight over the past few years The patient also has diagnosis of congestive heart failure/dilated cardiomyopathy He has been smoking since he was a teenager but denies regular alcohol intake He denies recent bruising/bleeding, such as spontaneous epistaxis, hematuria, melena or hematochezia He denies prior blood or platelet transfusions He is on antiplatelet  agent   MEDICAL HISTORY:  Past Medical History:  Diagnosis Date   CHF (congestive heart failure) (HCC)    Hyperlipidemia    Hypertension    Mitral regurgitation    Nonischemic cardiomyopathy (HCC)    Obesity    Obesity (BMI 30-39.9) 09/01/2015   OSA (obstructive sleep apnea)    severe   S/P implantation of automatic cardioverter/defibrillator (AICD)    Guidant Contact H177   Tobacco abuse     SURGICAL HISTORY: Past Surgical History:  Procedure Laterality Date   BIV ICD GENERATOR CHANGEOUT N/A 10/06/2020   Procedure: BIV ICD GENERATOR CHANGEOUT;  Surgeon: Fernande Elspeth BROCKS, MD;  Location: [facility] INVASIVE CV LAB;  Service: Cardiovascular;  Laterality: N/A;   CARDIAC DEFIBRILLATOR PLACEMENT     Guidant Contak H177 device     SOCIAL HISTORY: Social History   Socioeconomic History   Marital status: Single    Spouse name: Not on file   Number of children: Not on file   Years of education: Not on file   Highest education level: Not on file  Occupational History   Occupation: disabled   Occupation: work in Emergency planning/management officer  Tobacco Use   Smoking status: Every Day    Current packs/day: 0.25    Average packs/day: 0.3 packs/day for 20.0 years (5.0 ttl pk-yrs)    Types: Cigarettes   Smokeless tobacco: Never   Tobacco comments:    2 packs per week 07/17/2021  Vaping Use   Vaping status: Never Used  Substance and Sexual Activity   Alcohol use: Yes    Comment: socially   Drug use: Yes    Types: Marijuana    Comment: occasional   Sexual activity: Not on file  Other Topics Concern   Not on file  Social History Narrative   Not on file   Social Drivers of Health   Financial Resource Strain: Not on file  Food Insecurity: Not on file  Transportation Needs: Not on file  Physical Activity: Not on file  Stress: Not on file  Social Connections: Not on file  Intimate Partner Violence: Not on file    FAMILY HISTORY: Family History  Problem Relation Age of Onset    Cardiomyopathy Father    Hypertension Other    Heart disease Sister    Heart disease Brother     ALLERGIES:  is allergic to spironolactone .  MEDICATIONS:  Current Outpatient Medications  Medication Sig Dispense Refill   aspirin 81 MG tablet Take 81 mg by mouth daily.     atorvastatin  (LIPITOR) 40 MG tablet TAKE 1 TABLET(40 MG) BY MOUTH DAILY 90 tablet 1   carvedilol  (COREG ) 25 MG tablet Take 1 tablet (25 mg total) by mouth 2 (two) times daily with a meal. 180 tablet 3   cetirizine (ZYRTEC) 10 MG chewable tablet Chew 10 mg by mouth daily.     dapagliflozin  propanediol (  FARXIGA ) 10 MG TABS tablet TAKE 1 TABLET BY MOUTH EVERY DAY WITH BREAKFAST 90 tablet 3   eplerenone  (INSPRA ) 50 MG tablet TAKE 1 TABLET(50 MG) BY MOUTH DAILY 30 tablet 6   furosemide  (LASIX ) 40 MG tablet Take 1 tablet (40 mg total) by mouth every morning AND 0.5 tablets (20 mg total) every evening. 135 tablet 3   mometasone  (NASONEX ) 50 MCG/ACT nasal spray Place 2 sprays into the nose as needed.     potassium chloride  SA (KLOR-CON  M) 20 MEQ tablet TAKE 1 TABLET(20 MEQ) BY MOUTH DAILY 90 tablet 3   sacubitril -valsartan  (ENTRESTO ) 97-103 MG Take 1 tablet by mouth 2 (two) times daily. 180 tablet 3   sotalol  (BETAPACE ) 80 MG tablet Take 1 tablet (80 mg total) by mouth 2 (two) times daily. 180 tablet 3   No current facility-administered medications for this visit.    REVIEW OF SYSTEMS:   Constitutional: Denies fevers, chills or abnormal night sweats Eyes: Denies blurriness of vision, double vision or watery eyes Ears, nose, mouth, throat, and face: Denies mucositis or sore throat Respiratory: Denies cough, dyspnea or wheezes Cardiovascular: Denies palpitation, chest discomfort or lower extremity swelling Gastrointestinal:  Denies nausea, heartburn or change in bowel habits Skin: Denies abnormal skin rashes Lymphatics: Denies new lymphadenopathy or easy bruising Neurological:Denies numbness, tingling or new  weaknesses Behavioral/Psych: Mood is stable, no new changes  All other systems were reviewed with the patient and are negative.  PHYSICAL EXAMINATION: ECOG PERFORMANCE STATUS: 1 - Symptomatic but completely ambulatory  Vitals:   06/04/24 0913  BP: 97/62  Pulse: (!) 58  Resp: 18  Temp: (!) 97.4 F (36.3 C)  SpO2: 95%   Filed Weights   06/04/24 0913  Weight: 223 lb 3.2 oz (101.2 kg)    GENERAL:alert, no distress and comfortable SKIN: skin color, texture, turgor are normal, no rashes or significant lesions EYES: normal, conjunctiva are pink and non-injected, sclera clear OROPHARYNX:no exudate, no erythema and lips, buccal mucosa, and tongue normal  NECK: supple, thyroid normal size, non-tender, without nodularity LYMPH:  no palpable lymphadenopathy in the cervical, axillary or inguinal LUNGS: He has bilateral crackles on his lung bases on exam with normal breathing effort HEART: regular rate & rhythm and no murmurs and no lower extremity edema ABDOMEN:abdomen soft, non-tender and normal bowel sounds Musculoskeletal:no cyanosis of digits and no clubbing  PSYCH: alert & oriented x 3 with fluent speech NEURO: no focal motor/sensory deficits  LABORATORY DATA:  I have reviewed the data as listed Lab Results  Component Value Date   WBC 4.8 06/04/2024   HGB 16.1 06/04/2024   HCT 45.6 06/04/2024   MCV 88.7 06/04/2024   PLT 135 (L) 06/04/2024    RADIOGRAPHIC STUDIES: I have personally reviewed the radiological images as listed and agreed with the findings in the report. DG Chest 2 View Result Date: 06/04/2024 CLINICAL DATA:  Chronic smoker, bilateral lung crackles. Dilated cardiomyopathy. EXAM: CHEST - 2 VIEW COMPARISON:  Radiographs 09/17/2021 and 05/24/2017. FINDINGS: Left subclavian AICD leads appear unchanged, projecting over the right atrium, right ventricle and coronary sinus. Stable mild cardiac enlargement. Chronic interstitial prominence appears mildly progressive. There  is no confluent airspace disease, pneumothorax or significant pleural effusion. The bones appear unchanged, without acute osseous abnormality. IMPRESSION: Mildly progressive chronic interstitial prominence, likely fibrosis or increased interstitial edema. No confluent airspace disease. Electronically Signed   By: Spencer Perone M.D.   On: 06/04/2024 10:12

## 2024-06-04 NOTE — Assessment & Plan Note (Signed)
 He has been smoking since he was a teenager, as high as half a pack of cigarettes per day for a long time Recently, he has reduced his smoking to about 5 cigarettes/day He has never had screening chest CT and due to abnormal imaging, I recommend CT imaging of his chest for evaluation

## 2024-06-11 NOTE — Progress Notes (Signed)
 Remote ICD transmission.

## 2024-06-11 NOTE — Addendum Note (Signed)
 Addended by: TAWNI DRILLING D on: 06/11/2024 04:38 PM   Modules accepted: Orders

## 2024-06-14 ENCOUNTER — Ambulatory Visit (HOSPITAL_COMMUNITY)
Admission: RE | Admit: 2024-06-14 | Discharge: 2024-06-14 | Disposition: A | Discharge status: Home / Self Care | Source: Ambulatory Visit | Attending: Hematology and Oncology | Admitting: Hematology and Oncology

## 2024-06-14 DIAGNOSIS — Z720 Tobacco use: Secondary | ICD-10-CM | POA: Insufficient documentation

## 2024-06-14 DIAGNOSIS — I420 Dilated cardiomyopathy: Secondary | ICD-10-CM | POA: Diagnosis present

## 2024-06-14 DIAGNOSIS — D696 Thrombocytopenia, unspecified: Secondary | ICD-10-CM | POA: Diagnosis present

## 2024-06-18 ENCOUNTER — Inpatient Hospital Stay: Attending: Hematology and Oncology | Admitting: Hematology and Oncology

## 2024-06-19 ENCOUNTER — Encounter: Payer: Self-pay | Admitting: Hematology and Oncology

## 2024-07-02 ENCOUNTER — Other Ambulatory Visit (HOSPITAL_COMMUNITY): Payer: Self-pay | Admitting: Cardiology

## 2024-07-12 ENCOUNTER — Ambulatory Visit: Admitting: Primary Care

## 2024-07-12 DIAGNOSIS — G4733 Obstructive sleep apnea (adult) (pediatric): Secondary | ICD-10-CM

## 2024-08-15 ENCOUNTER — Other Ambulatory Visit (HOSPITAL_COMMUNITY): Payer: Self-pay

## 2024-08-15 DIAGNOSIS — I5022 Chronic systolic (congestive) heart failure: Secondary | ICD-10-CM

## 2024-09-05 ENCOUNTER — Telehealth: Payer: Self-pay

## 2024-09-05 NOTE — Telephone Encounter (Signed)
 Pt should be marked as a new pt instead of a f/u as we have not seen him in 3 years. I was unable to find pt in [facility]. I called and spoke to pt. Pt will bring his SD card or machine with him to his appt. NFN

## 2024-09-06 ENCOUNTER — Ambulatory Visit: Admitting: Primary Care

## 2024-09-06 DIAGNOSIS — G4733 Obstructive sleep apnea (adult) (pediatric): Secondary | ICD-10-CM

## 2024-09-11 ENCOUNTER — Encounter: Payer: Self-pay | Admitting: Physician Assistant

## 2024-09-11 ENCOUNTER — Ambulatory Visit: Admitting: Physician Assistant

## 2024-09-11 ENCOUNTER — Ambulatory Visit: Payer: Self-pay | Admitting: *Deleted

## 2024-09-11 VITALS — BP 108/68 | HR 62 | Temp 96.5°F | Resp 16 | Ht 68.0 in | Wt 213.0 lb

## 2024-09-11 DIAGNOSIS — D696 Thrombocytopenia, unspecified: Secondary | ICD-10-CM | POA: Diagnosis not present

## 2024-09-11 DIAGNOSIS — M25561 Pain in right knee: Secondary | ICD-10-CM

## 2024-09-11 DIAGNOSIS — K7581 Nonalcoholic steatohepatitis (NASH): Secondary | ICD-10-CM

## 2024-09-11 DIAGNOSIS — Z720 Tobacco use: Secondary | ICD-10-CM

## 2024-09-11 DIAGNOSIS — R9389 Abnormal findings on diagnostic imaging of other specified body structures: Secondary | ICD-10-CM

## 2024-09-11 DIAGNOSIS — F17210 Nicotine dependence, cigarettes, uncomplicated: Secondary | ICD-10-CM

## 2024-09-11 DIAGNOSIS — J439 Emphysema, unspecified: Secondary | ICD-10-CM

## 2024-09-11 DIAGNOSIS — M79661 Pain in right lower leg: Secondary | ICD-10-CM | POA: Diagnosis not present

## 2024-09-11 DIAGNOSIS — K7469 Other cirrhosis of liver: Secondary | ICD-10-CM

## 2024-09-11 MED ORDER — GABAPENTIN 100 MG PO CAPS: 100.0000 mg | ORAL_CAPSULE | Freq: Two times a day (BID) | ORAL | 1 refills | Status: AC

## 2024-09-11 NOTE — Telephone Encounter (Signed)
 noted

## 2024-09-11 NOTE — Telephone Encounter (Signed)
 FYI Only or Action Required?: FYI only for provider.  Patient was last seen in primary care on 03/27/2024 by Celestia Rosaline SQUIBB, NP.  Called Nurse Triage reporting leg pain (R leg pain).  Symptoms began about a month ago.  Interventions attempted: OTC medications: tylenol .  Symptoms are: unchanged.  Triage Disposition: See PCP When Office is Open (Within 3 Days)  Patient/caregiver understands and will follow disposition?: yes   Reason for Disposition  [1] MODERATE pain (e.g., interferes with normal activities, limping) AND [2] present > 3 days  Answer Assessment - Initial Assessment Questions Patient was seen at [facility]- [facility] ray normal- patient was given medication for pain- did help some.  No open appointment- offered alternative location- patient declined- will go to mobile unit 1. ONSET: When did the pain start?      3 weeks 2. LOCATION: Where is the pain located?      R leg- knee to the shin bone 3. PAIN: How bad is the pain?    (Scale 1-10; or mild, moderate, severe)     Hard to walk- 6-7/10 4. WORK OR EXERCISE: Has there been any recent work or exercise that involved this part of the body?      no 5. CAUSE: What do you think is causing the leg pain?     unknown 6. OTHER SYMPTOMS: Do you have any other symptoms? (e.g., chest pain, back pain, breathing difficulty, swelling, rash, fever, numbness, weakness)     Did have swelling- knee and front- has returned to normal  Protocols used: Leg Pain-A-AH Copied from CRM #[id number]. Topic: Clinical - Red Word Triage >> Sep 11, 2024  8:53 AM Ivette P wrote: Red Word that prompted transfer to Nurse Triage: leg has been aching in the front.   Right leg

## 2024-09-11 NOTE — Patient Instructions (Signed)
 VISIT SUMMARY:  You came in today because of pain in your right knee and lower leg that has been bothering you for three weeks. The pain is mostly in your shin and sometimes feels like a burning sensation. You also have a history of emphysema, low platelet counts, and you are a smoker.  YOUR PLAN:  -RIGHT LOWER LEG AND KNEE PAIN: Your pain might be due to a problem with your blood vessels. We are starting you on gabapentin 100 mg twice daily to help with the pain. We will also check your potassium levels with a blood test and refer you to a vein and vascular specialist for further evaluation.  -EMPHYSEMA: Emphysema is a lung condition that causes shortness of breath. Your recent chest CT showed some changes in your lungs, so we are ordering a high-resolution chest CT to get a clearer picture.  -THROMBOCYTOPENIA: Thrombocytopenia means you have a low platelet count, which can affect your blood's ability to clot. This might be related to liver issues.  -SUSPECTED CIRRHOSIS DUE TO NONALCOHOLIC FATTY LIVER DISEASE: Cirrhosis is a condition where your liver is scarred and doesn't work properly. We suspect this might be due to nonalcoholic fatty liver disease. We are ordering a liver ultrasound to check your liver's condition.  -NICOTINE  DEPENDENCE, CIGARETTES: You are currently smoking seven cigarettes a day. Quitting smoking is highly recommended to improve your overall health.

## 2024-09-11 NOTE — Progress Notes (Unsigned)
 Established Patient Office Visit  Subjective   Patient ID: Spencer Robertson, male    DOB: September 25, 1961  Age: 63 y.o. MRN: [id number]  Chief Complaint  Patient presents with   Leg Pain    Right leg pain  Discussed the use of AI scribe software for clinical note transcription with the patient, who gave verbal consent to proceed.  History of Present Illness   Spencer Robertson is a 63 year old male who presents with right knee and lower leg pain.  He has experienced right knee and lower leg pain for three weeks, with sudden onset and no known injury. The pain is primarily in the shin, with occasional 'shock pains' in the knee, occurring at rest and with movement, and worsening with prolonged walking. It is described as a burning sensation, with occasional swelling that has subsided. There is no numbness, tingling, or discoloration.  Per patient, An x-ray at an urgent care facility showed no abnormalities. Meloxicam was prescribed but did not relieve the pain. Tylenol  provides temporary relief.  His medical history includes a pacemaker and low platelet counts. He smokes seven cigarettes daily and occasionally consumes alcohol. He takes potassium supplements daily.   Results LABS Platelet Count: 135,000 (05/2024)  RADIOLOGY Knee and Leg X-ray: No abnormalities detected     Past Medical History:  Diagnosis Date   CHF (congestive heart failure) (HCC)    Hyperlipidemia    Hypertension    Mitral regurgitation    Nonischemic cardiomyopathy (HCC)    Obesity    Obesity (BMI 30-39.9) 09/01/2015   OSA (obstructive sleep apnea)    severe   S/P implantation of automatic cardioverter/defibrillator (AICD)    Guidant Contact H177   Tobacco abuse    Social History   Socioeconomic History   Marital status: Single    Spouse name: Not on file   Number of children: Not on file   Years of education: Not on file   Highest education level: Not on file  Occupational History   Occupation:  disabled   Occupation: work in Emergency planning/management officer  Tobacco Use   Smoking status: Every Day    Current packs/day: 0.25    Average packs/day: 0.3 packs/day for 20.0 years (5.0 ttl pk-yrs)    Types: Cigarettes   Smokeless tobacco: Never   Tobacco comments:    2 packs per week 07/17/2021  Vaping Use   Vaping status: Never Used  Substance and Sexual Activity   Alcohol use: Yes    Comment: socially   Drug use: Yes    Types: Marijuana    Comment: occasional   Sexual activity: Not on file  Other Topics Concern   Not on file  Social History Narrative   Not on file   Social Drivers of Health   Financial Resource Strain: Not on file  Food Insecurity: Not on file  Transportation Needs: Not on file  Physical Activity: Not on file  Stress: Not on file  Social Connections: Not on file  Intimate Partner Violence: Not on file   Family History  Problem Relation Age of Onset   Cardiomyopathy Father    Hypertension Other    Heart disease Sister    Heart disease Brother    Allergies  Allergen Reactions   Spironolactone  Other (See Comments)    gynecomastia    Review of Systems  Constitutional: Negative.   HENT: Negative.    Eyes: Negative.   Respiratory:  Negative for shortness of breath.   Cardiovascular:  Negative for chest pain.  Gastrointestinal: Negative.   Genitourinary: Negative.   Musculoskeletal:  Positive for joint pain and myalgias.  Skin: Negative.   Neurological: Negative.   Endo/Heme/Allergies: Negative.   Psychiatric/Behavioral: Negative.        Objective:     BP 108/68   Pulse 62   Temp (!) 96.5 F (35.8 C) (Oral)   Resp 16   Ht 5' 8 (1.727 m)   Wt 213 lb (96.6 kg)   SpO2 98%   BMI 32.39 kg/m  BP Readings from Last 3 Encounters:  09/11/24 108/68  06/04/24 97/62  05/29/24 90/66   Wt Readings from Last 3 Encounters:  09/11/24 213 lb (96.6 kg)  06/04/24 223 lb 3.2 oz (101.2 kg)  05/29/24 223 lb (101.2 kg)    Physical Exam Vitals and  nursing note reviewed.    GENERAL: Alert, cooperative, well developed, no acute distress. HEENT: Normocephalic, normal oropharynx, moist mucous membranes. CHEST: Clear to auscultation bilaterally, no wheezes, rhonchi, or crackles. CARDIOVASCULAR: Normal heart rate and rhythm, S1 and S2 normal without murmurs. EXTREMITIES: No cyanosis or edema. MUSCULOSKELETAL: Right knee pain on inward rotation, right shin pain on palpation. NEUROLOGICAL: Cranial nerves grossly intact, moves all extremities without gross motor or sensory deficit.   Assessment & Plan:   Problem List Items Addressed This Visit       Digestive   Liver cirrhosis secondary to NASH (nonalcoholic steatohepatitis) (HCC)   Relevant Orders   US  Abdomen Limited RUQ (LIVER/GB)     Hematopoietic and Hemostatic   Thrombocytopenia   Relevant Orders   US  Abdomen Limited RUQ (LIVER/GB)     Other   Tobacco abuse   Relevant Orders   Ambulatory referral to Vascular Surgery   CT Chest High Resolution   Other Visit Diagnoses       Pain in right lower leg    -  Primary   Relevant Medications   gabapentin (NEURONTIN) 100 MG capsule   Other Relevant Orders   Ambulatory referral to Vascular Surgery   Basic metabolic panel with GFR (Completed)     Abnormal screening CT of chest       Relevant Orders   CT Chest High Resolution     IMPRESSION: 1. Lung-RADS 1, negative. Continue annual screening with low-dose chest CT without contrast in 12 months. 2. Heterogeneous areas of ground-glass attenuation identified within both lungs most notable within the right upper lobe. Findings are nonspecific and may reflect sequelae of atypical infection or inflammatory process. Underlying interstitial lung disease not excluded. Consider more definitive characterization with high-resolution CT of the chest. These changes may diminish sensitivity for detecting small lung nodules. 3. Prominent mediastinal lymph nodes are identified,  nonspecific. In the setting of interstitial lung disease this is a nonspecific finding and may reflect reactive adenopathy. 4. Coronary artery calcifications. 5. Aortic Atherosclerosis (ICD10-I70.0) and Emphysema (ICD10-J43.9).  would prefer PM appt    Electronically Signed   By: Waddell Calk M.D.   On: 06/18/2024 08:23  Assessment and Plan Right lower leg and knee pain, possible vascular etiology Pain with burning sensation and intermittent swelling, unresponsive to meloxicam, suggests vascular insufficiency. - Start gabapentin 100 mg twice daily. - Order metabolic panel to check potassium levels. - Refer to vein and vascular specialist. The patient was given clear instructions to go to ER or return to medical center if symptoms don't improve, worsen or new problems develop. The patient verbalized understanding.    Emphysema Low-dose chest CT shows  ground-glass opacities, suggesting possible emphysema. - Order high-resolution chest CT.  Thrombocytopenia Chronic low platelet count, possibly due to liver cirrhosis.  Suspected cirrhosis due to nonalcoholic fatty liver disease Suspected cirrhosis based on imaging and thrombocytopenia. No recent liver imaging since 2021. - Order liver ultrasound.  Nicotine  dependence, cigarettes Current smoker with a 20-year history, smoking seven cigarettes daily.   I have reviewed the patient's medical history (PMH, PSH, Social History, Family History, Medications, and allergies) , and have been updated if relevant. I spent 30 minutes reviewing chart and  face to face time with patient.     Return in about 2 weeks (around 09/25/2024) for With [facility].    Kirk RAMAN Mayers, PA-C

## 2024-09-12 LAB — BASIC METABOLIC PANEL WITH GFR
BUN/Creatinine Ratio: 17 (ref 10–24)
BUN: 31 mg/dL — ABNORMAL HIGH (ref 8–27)
CO2: 21 mmol/L (ref 20–29)
Calcium: 9.7 mg/dL (ref 8.6–10.2)
Chloride: 102 mmol/L (ref 96–106)
Creatinine, Ser: 1.84 mg/dL — ABNORMAL HIGH (ref 0.76–1.27)
Glucose: 114 mg/dL — ABNORMAL HIGH (ref 70–99)
Potassium: 4.3 mmol/L (ref 3.5–5.2)
Sodium: 141 mmol/L (ref 134–144)
eGFR: 41 mL/min/1.73 — ABNORMAL LOW (ref >59)

## 2024-09-13 ENCOUNTER — Encounter: Payer: Self-pay | Admitting: Physician Assistant

## 2024-09-13 ENCOUNTER — Ambulatory Visit: Payer: Self-pay | Admitting: Physician Assistant

## 2024-09-17 ENCOUNTER — Ambulatory Visit (HOSPITAL_COMMUNITY)
Admission: RE | Admit: 2024-09-17 | Discharge: 2024-09-17 | Disposition: A | Discharge status: Home / Self Care | Source: Ambulatory Visit | Attending: Physician Assistant | Admitting: Physician Assistant

## 2024-09-17 DIAGNOSIS — K7581 Nonalcoholic steatohepatitis (NASH): Secondary | ICD-10-CM | POA: Insufficient documentation

## 2024-09-17 DIAGNOSIS — D696 Thrombocytopenia, unspecified: Secondary | ICD-10-CM | POA: Insufficient documentation

## 2024-09-17 DIAGNOSIS — K7469 Other cirrhosis of liver: Secondary | ICD-10-CM | POA: Insufficient documentation

## 2024-09-18 ENCOUNTER — Other Ambulatory Visit: Payer: Self-pay

## 2024-09-18 DIAGNOSIS — M79606 Pain in leg, unspecified: Secondary | ICD-10-CM

## 2024-09-20 NOTE — Progress Notes (Signed)
 Name and DOB verified, test  results and providers recommendations reviewed with pt. He stated understanding. Advised him to call medical records in regards to the liver cirrhosis dx

## 2024-09-21 ENCOUNTER — Telehealth: Payer: Self-pay | Admitting: Primary Care

## 2024-09-21 NOTE — Telephone Encounter (Signed)
 Copied from CRM [id number]. Topic: Referral - Prior Authorization Question >> Sep 21, 2024  9:50 AM Terri MATSU wrote: Reason for CRM: Avelina from imaging calling regarding patient's CT Scan appointment for Monday the 20th needs a prior auth.

## 2024-09-24 ENCOUNTER — Ambulatory Visit (HOSPITAL_COMMUNITY)

## 2024-09-24 NOTE — Telephone Encounter (Unsigned)
 Copied from CRM #[id number]. Topic: Referral - Status >> Sep 24, 2024  9:28 AM Olam RAMAN wrote: Reason for CRM: trisha from [facility] for ct scan appt 10/20 @4pm  [phone] ext [phone] If no response back pt will need to be rescheduled

## 2024-09-24 NOTE — Telephone Encounter (Signed)
 Prior auth from has been printed and filled out with supporting documents and has been faxed.

## 2024-09-25 ENCOUNTER — Telehealth: Payer: Self-pay | Admitting: Physician Assistant

## 2024-09-25 NOTE — Telephone Encounter (Unsigned)
 Copied from CRM #[id number]. Topic: Clinical - Medication Prior Auth >> Sep 25, 2024 10:06 AM Precious C wrote: Reason for CRM: Sueanne from [facility] called regarding the patient's upcoming imaging appointment scheduled for October 24. She stated that the appointment requires a pre-authorization, but there is currently no pre-authorization on file for the patient. She is requesting that a pre-authorization be obtained before the patient can be seen for the appointment.   Callback:[phone]

## 2024-09-25 NOTE — Telephone Encounter (Signed)
 Contacted pt to cancel appt with [facility] on 10/22. Pt has appt scheduled with primary care.

## 2024-09-25 NOTE — Telephone Encounter (Signed)
 Prior auth from was printed and filled out with supporting documents and was faxed on 09/24/2024.   Please look under media for prior auth form and supporting documents

## 2024-09-25 NOTE — Telephone Encounter (Signed)
 Contacted pt to schedule follow up visit. Pt is scheduled for 10/22

## 2024-09-26 ENCOUNTER — Ambulatory Visit (INDEPENDENT_AMBULATORY_CARE_PROVIDER_SITE_OTHER): Admitting: Primary Care

## 2024-09-26 ENCOUNTER — Encounter (INDEPENDENT_AMBULATORY_CARE_PROVIDER_SITE_OTHER): Payer: Self-pay | Admitting: Primary Care

## 2024-09-26 ENCOUNTER — Ambulatory Visit: Admitting: Physician Assistant

## 2024-09-26 VITALS — BP 89/62 | HR 58 | Resp 16 | Wt 216.0 lb

## 2024-09-26 DIAGNOSIS — R7303 Prediabetes: Secondary | ICD-10-CM

## 2024-09-26 DIAGNOSIS — M25561 Pain in right knee: Secondary | ICD-10-CM | POA: Diagnosis not present

## 2024-09-26 DIAGNOSIS — E785 Hyperlipidemia, unspecified: Secondary | ICD-10-CM | POA: Diagnosis not present

## 2024-09-26 DIAGNOSIS — H6122 Impacted cerumen, left ear: Secondary | ICD-10-CM

## 2024-09-26 DIAGNOSIS — I959 Hypotension, unspecified: Secondary | ICD-10-CM

## 2024-09-26 NOTE — Progress Notes (Signed)
 [facility]  Spencer Robertson, is a 63 y.o. male  RDW:[id number]  FMW:[id number]  DOB - 07/11/1961  Chief Complaint  Patient presents with   Prediabetes       Subjective:   Spencer Robertson is a 63 y.o. obese male here today for a follow up visit.  Management of prediabetes and hyperlipidemia.  He is followed by cardiology for systolic heart failure and blood pressure has been low review previous notes and they have been significantly lower than normal.  Patient has No headache, No chest pain, No abdominal pain - No Nausea, No new weakness tingling or numbness, No Cough - shortness of breath Today blood pressure is 89/62 will send Dr. Rolan a message patient is adherent  to all his medications.  Patient stated blood pressure has generally been low since he has been on his medication. Patient voiced concerns about right knee swelling painful and some days difficulty to walk for the last 4 to 6 weeks.  He has difficulty walking up stairs, getting in and out of a chair right knee stiffens.  On assessment popping and crepitus felt.  Patient also has been experiencing popping.  He rates his pain 6/10 he was advised to take Tylenol  with little to no help.  Discussed with patient being referred to orthopedics for further evaluation and treatment.  And he is in agreement  No problems updated.  Comprehensive ROS Pertinent positive and negative noted in HPI   Allergies  Allergen Reactions   Spironolactone  Other (See Comments)    gynecomastia    Past Medical History:  Diagnosis Date   CHF (congestive heart failure) (HCC)    Hyperlipidemia    Hypertension    Mitral regurgitation    Nonischemic cardiomyopathy (HCC)    Obesity    Obesity (BMI 30-39.9) 09/01/2015   OSA (obstructive sleep apnea)    severe   S/P implantation of automatic cardioverter/defibrillator (AICD)    Guidant Contact H177   Tobacco abuse     Current Outpatient Medications on File Prior to Visit  Medication  Sig Dispense Refill   aspirin 81 MG tablet Take 81 mg by mouth daily.     atorvastatin  (LIPITOR) 40 MG tablet Take 1 tablet (40 mg total) by mouth daily. 90 tablet 1   carvedilol  (COREG ) 25 MG tablet Take 1 tablet (25 mg total) by mouth 2 (two) times daily with a meal. 180 tablet 3   cetirizine (ZYRTEC) 10 MG chewable tablet Chew 10 mg by mouth daily.     dapagliflozin  propanediol (FARXIGA ) 10 MG TABS tablet TAKE 1 TABLET BY MOUTH EVERY DAY WITH BREAKFAST 90 tablet 3   eplerenone  (INSPRA ) 50 MG tablet TAKE 1 TABLET(50 MG) BY MOUTH DAILY 30 tablet 6   furosemide  (LASIX ) 40 MG tablet Take 1 tablet (40 mg total) by mouth every morning AND 0.5 tablets (20 mg total) every evening. 135 tablet 3   gabapentin (NEURONTIN) 100 MG capsule Take 1 capsule (100 mg total) by mouth 2 (two) times daily. 60 capsule 1   mometasone  (NASONEX ) 50 MCG/ACT nasal spray Place 2 sprays into the nose as needed.     potassium chloride  SA (KLOR-CON  M) 20 MEQ tablet TAKE 1 TABLET(20 MEQ) BY MOUTH DAILY 90 tablet 3   sacubitril -valsartan  (ENTRESTO ) 97-103 MG Take 1 tablet by mouth 2 (two) times daily. 180 tablet 3   sotalol  (BETAPACE ) 80 MG tablet Take 1 tablet (80 mg total) by mouth 2 (two) times daily. 180 tablet 3   No  current facility-administered medications on file prior to visit.   Health Maintenance  Topic Date Due   Pneumococcal Vaccine for age over 31 (1 of 2 - PCV) Never done   Zoster (Shingles) Vaccine (1 of 2) Never done   Hepatitis B Vaccine (1 of 3 - Risk 3-dose series) Never done   Flu Shot  07/06/2024   COVID-19 Vaccine (3 - 2025-26 season) 08/06/2024   Cologuard (Stool DNA test)  09/21/2025   DTaP/Tdap/Td vaccine (2 - Td or Tdap) 08/25/2030   Hepatitis C Screening  Completed   HIV Screening  Completed   HPV Vaccine  Aged Out   Meningitis B Vaccine  Aged Out    Objective:   Vitals:   09/26/24 1041  BP: (!) 89/62  Pulse: (!) 58  Resp: 16  SpO2: 98%  Weight: 216 lb (98 kg)   BP Readings from  Last 3 Encounters:  09/26/24 (!) 89/62  09/11/24 108/68  06/04/24 97/62      Physical Exam Vitals reviewed.  Constitutional:      Appearance: He is obese.  HENT:     Head: Normocephalic.     Right Ear: Tympanic membrane and external ear normal.     Left Ear: Tympanic membrane and external ear normal.     Nose: Nose normal.  Eyes:     Extraocular Movements: Extraocular movements intact.     Pupils: Pupils are equal, round, and reactive to light.  Cardiovascular:     Rate and Rhythm: Normal rate and regular rhythm.  Pulmonary:     Effort: Pulmonary effort is normal.     Breath sounds: Normal breath sounds.  Abdominal:     General: Bowel sounds are normal. There is distension.     Palpations: Abdomen is soft.  Musculoskeletal:        General: Normal range of motion.     Cervical back: Normal range of motion and neck supple.  Skin:    General: Skin is warm and dry.  Neurological:     Mental Status: He is alert and oriented to person, place, and time.  Psychiatric:        Mood and Affect: Mood normal.        Behavior: Behavior normal.        Thought Content: Thought content normal.        Judgment: Judgment normal.   She  Assessment & Plan  Spencer Robertson was seen today for prediabetes.  Diagnoses and all orders for this visit:  Prediabetes -     Hemoglobin A1c -     Lipid panel  Hyperlipidemia, unspecified hyperlipidemia type -     Lipid panel  Acute pain of right knee -     AMB referral to orthopedics  Hypotension, unspecified hypotension type Sent message to Dr. Mclean no signs and symptoms  Impacted cerumen of left ear Ceruminosis is noted.  Wax is removed by the okay d manual debridement. Instructions for home care to prevent wax buildup are given.     Patient have been counseled extensively about nutrition and exercise. Other issues discussed during this visit include: low cholesterol diet, weight control and daily exercise, foot care, annual eye examinations  at Ophthalmology, importance of adherence with medications and regular follow-up. We also discussed long term complications of uncontrolled diabetes and hypertension.   Return in about 6 months (around 03/27/2025) for fasting labs.  The patient was given clear instructions to go to ER or return to medical center if symptoms  don't improve, worsen or new problems develop. The patient verbalized understanding. The patient was told to call to get lab results if they haven't heard anything in the next week.   This note has been created with Education officer, environmental. Any transcriptional errors are unintentional.   Spencer SHAUNNA Bohr, NP 09/26/2024, 11:49 AM

## 2024-09-27 LAB — LIPID PANEL
Chol/HDL Ratio: 2.6 ratio (ref 0.0–5.0)
Cholesterol, Total: 99 mg/dL — ABNORMAL LOW (ref 100–199)
HDL: 38 mg/dL — ABNORMAL LOW (ref >39)
LDL Chol Calc (NIH): 46 mg/dL (ref 0–99)
Triglycerides: 69 mg/dL (ref 0–149)
VLDL Cholesterol Cal: 15 mg/dL (ref 5–40)

## 2024-09-27 LAB — HEMOGLOBIN A1C
Est. average glucose Bld gHb Est-mCnc: 128 mg/dL
Hgb A1c MFr Bld: 6.1 % — ABNORMAL HIGH (ref 4.8–5.6)

## 2024-09-27 NOTE — Telephone Encounter (Signed)
 Spoke with Lolita with [facility] and made aware that prior auth form was faxed and per Lolita she reached out in regards to prior auth and they didn't have any record of it  Lolita provided me the phone number to reach out in regards to prior auth   [phone]  [facility] M. and spoke intake representative    CPT- [phone] CT Chest high resolution  ICD-Z72.0 and r93.89   Tracking [phone]   Gave all clinical information    Faxed all clinical information to [phone]  Waiting for authorization   Reached out to Lolita and made aware prior auth has been started and is just waiting for a determination

## 2024-09-27 NOTE — Telephone Encounter (Unsigned)
 Copied from CRM [id number]. Topic: Clinical - Medication Prior Auth >> Sep 26, 2024  1:13 PM Shanda MATSU wrote: Reason for CRM: Lolita with [facility] called in regards to auth for CT scan still being needed, directed caller on where in the patient's chart where she can see where shara has been sent to patient's insurance, [facility], caller had no further questions at this time, stated she will reach out to [facility] to check status of auth. >> Sep 27, 2024 11:40 AM Winona SAUNDERS wrote: Lolita with [facility] called in regards to auth for CT scan still being needed. [employer] have not received the prior Auth for this scan. I tried calling [employer] three times no answer please contact Lolita with any additional information and or contact [employer] to provide the prior auth info. Call back: [phone] ext [phone].

## 2024-09-28 ENCOUNTER — Ambulatory Visit (HOSPITAL_COMMUNITY)

## 2024-10-01 ENCOUNTER — Ambulatory Visit (INDEPENDENT_AMBULATORY_CARE_PROVIDER_SITE_OTHER): Payer: Self-pay | Admitting: Primary Care

## 2024-10-04 ENCOUNTER — Other Ambulatory Visit (INDEPENDENT_AMBULATORY_CARE_PROVIDER_SITE_OTHER): Payer: Self-pay

## 2024-10-04 ENCOUNTER — Ambulatory Visit: Admitting: Orthopaedic Surgery

## 2024-10-04 DIAGNOSIS — M1711 Unilateral primary osteoarthritis, right knee: Secondary | ICD-10-CM

## 2024-10-04 MED ORDER — BUPIVACAINE HCL 0.5 % IJ SOLN
2.0000 mL | INTRAMUSCULAR | Status: AC | PRN
  Administered 2024-10-04: 2 mL via INTRA_ARTICULAR

## 2024-10-04 MED ORDER — LIDOCAINE HCL 1 % IJ SOLN
2.0000 mL | INTRAMUSCULAR | Status: AC | PRN
  Administered 2024-10-04: 2 mL

## 2024-10-04 MED ORDER — NAPROXEN 500 MG PO TABS: 500.0000 mg | ORAL_TABLET | Freq: Two times a day (BID) | ORAL | 3 refills | Status: AC

## 2024-10-04 MED ORDER — METHYLPREDNISOLONE ACETATE 40 MG/ML IJ SUSP
40.0000 mg | INTRAMUSCULAR | Status: AC | PRN
  Administered 2024-10-04: 40 mg via INTRA_ARTICULAR

## 2024-10-04 NOTE — Progress Notes (Signed)
 Office Visit Note   Patient: Spencer Robertson           Date of Birth: 06/18/1961           MRN: [id number] Visit Date: 10/04/2024              Requested by: Celestia Rosaline SQUIBB, NP [address] [place],  [place] [address] PCP: Celestia Rosaline SQUIBB, NP   Assessment & Plan: Visit Diagnoses:  1. Primary osteoarthritis of right knee     Plan: History of Present Illness Spencer Robertson is a 63 year old male who presents with right knee pain.  He has experienced right knee pain for one month, characterized by tingling, burning, and numbness, primarily in the knee and radiating throughout the area. He uses Tylenol  for pain relief with some effect and wears a compression knee brace. No recent injuries, surgeries, or injections to the knee. No gout.  Physical Exam MUSCULOSKELETAL: No effusion in right knee. Good range of motion in right knee.  No joint line tenderness.  Collaterals and cruciates are stable.  Results RADIOLOGY Right knee X-ray: Mild osteoarthritis  Assessment and Plan Right knee osteoarthritis X-rays show mild arthritis without effusion. Symptoms likely due to arthritis flare. Not severe enough for knee replacement. - Recommend quadriceps strengthening. - Advise weight management. - Suggest over-the-counter NSAIDs like Advil or Aleve, noting potential long-term harm. - Prescribe stronger Aleve to be filled at [facility] on [address]. - Discuss option of cortisone injection - Introduce Voltaren gel as topical treatment. - Administer cortisone injection today.  Follow-Up Instructions: No follow-ups on file.   Orders:  Orders Placed This Encounter  Procedures   Large Joint Inj: R knee   XR KNEE 3 VIEW RIGHT   Meds ordered this encounter  Medications   naproxen (NAPROSYN) 500 MG tablet    Sig: Take 1 tablet (500 mg total) by mouth 2 (two) times daily with a meal.    Dispense:  30 tablet    Refill:  3      Procedures: Large Joint Inj: R knee on  10/04/2024 9:36 AM Indications: pain Details: 22 G needle  Arthrogram: No  Medications: 40 mg methylPREDNISolone acetate 40 MG/ML; 2 mL lidocaine  1 %; 2 mL bupivacaine 0.5 % Consent was given by the patient. Patient was prepped and draped in the usual sterile fashion.       Clinical Data: No additional findings.   Subjective: Chief Complaint  Patient presents with   Right Knee - Pain    HPI  Review of Systems  Constitutional: Negative.   HENT: Negative.    Eyes: Negative.   Respiratory: Negative.    Cardiovascular: Negative.   Gastrointestinal: Negative.   Endocrine: Negative.   Genitourinary: Negative.   Skin: Negative.   Allergic/Immunologic: Negative.   Neurological: Negative.   Hematological: Negative.   Psychiatric/Behavioral: Negative.    All other systems reviewed and are negative.    Objective: Vital Signs: There were no vitals taken for this visit.  Physical Exam Vitals and nursing note reviewed.  Constitutional:      Appearance: He is well-developed.  HENT:     Head: Normocephalic and atraumatic.  Eyes:     Pupils: Pupils are equal, round, and reactive to light.  Pulmonary:     Effort: Pulmonary effort is normal.  Abdominal:     Palpations: Abdomen is soft.  Musculoskeletal:        General: Normal range of motion.  Cervical back: Neck supple.  Skin:    General: Skin is warm.  Neurological:     Mental Status: He is alert and oriented to person, place, and time.  Psychiatric:        Behavior: Behavior normal.        Thought Content: Thought content normal.        Judgment: Judgment normal.     Ortho Exam  Specialty Comments:  No specialty comments available.  Imaging: XR KNEE 3 VIEW RIGHT Result Date: 10/04/2024 X-rays of the right knee show no acute abnormalities.    PMFS History: Patient Active Problem List   Diagnosis Date Noted   Primary osteoarthritis of right knee 10/04/2024   Thrombocytopenia 06/04/2024   Liver  cirrhosis secondary to NASH (nonalcoholic steatohepatitis) (HCC) 06/04/2024   Benign essential HTN 09/01/2015   Obesity (BMI 30-39.9) 09/01/2015   Orthostatic lightheadedness 01/18/2014   Implantable cardioverter-defibrillator (ICD) in situ 12/10/2011   Tobacco abuse    Dilated cardiomyopathy (HCC) 08/12/2009   SYSTOLIC HEART FAILURE, CHRONIC 08/12/2009   OSA (obstructive sleep apnea) 08/12/2009   Past Medical History:  Diagnosis Date   CHF (congestive heart failure) (HCC)    Hyperlipidemia    Hypertension    Mitral regurgitation    Nonischemic cardiomyopathy (HCC)    Obesity    Obesity (BMI 30-39.9) 09/01/2015   OSA (obstructive sleep apnea)    severe   S/P implantation of automatic cardioverter/defibrillator (AICD)    Guidant Contact H177   Tobacco abuse     Family History  Problem Relation Age of Onset   Cardiomyopathy Father    Hypertension Other    Heart disease Sister    Heart disease Brother     Past Surgical History:  Procedure Laterality Date   BIV ICD GENERATOR CHANGEOUT N/A 10/06/2020   Procedure: BIV ICD GENERATOR CHANGEOUT;  Surgeon: Fernande Elspeth BROCKS, MD;  Location: [facility] INVASIVE CV LAB;  Service: Cardiovascular;  Laterality: N/A;   CARDIAC DEFIBRILLATOR PLACEMENT     Guidant Contak H177 device    Social History   Occupational History   Occupation: disabled   Occupation: work in emergency planning/management officer  Tobacco Use   Smoking status: Every Day    Current packs/day: 0.25    Average packs/day: 0.3 packs/day for 20.0 years (5.0 ttl pk-yrs)    Types: Cigarettes   Smokeless tobacco: Never   Tobacco comments:    2 packs per week 07/17/2021  Vaping Use   Vaping status: Never Used  Substance and Sexual Activity   Alcohol use: Yes    Comment: socially   Drug use: Yes    Types: Marijuana    Comment: occasional   Sexual activity: Not on file

## 2024-10-05 ENCOUNTER — Telehealth (HOSPITAL_COMMUNITY): Payer: Self-pay | Admitting: Cardiology

## 2024-10-05 NOTE — Telephone Encounter (Signed)
 Called to confirm/remind patient of their appointment [facility] on 10/05/24.   Appointment:   [x] Confirmed  [] Left mess   [] No answer/No voice mail  [] VM Full/unable to leave message  [] Phone not in service  Patient reminded to bring all medications and/or complete list.  Confirmed patient has transportation. Gave directions, instructed to utilize valet parking.

## 2024-10-08 ENCOUNTER — Ambulatory Visit (HOSPITAL_COMMUNITY): Admission: RE | Admit: 2024-10-08 | Source: Ambulatory Visit

## 2024-10-08 ENCOUNTER — Encounter (HOSPITAL_COMMUNITY): Admitting: Cardiology

## 2024-10-11 ENCOUNTER — Encounter (HOSPITAL_COMMUNITY)

## 2024-10-11 ENCOUNTER — Encounter: Admitting: Vascular Surgery

## 2024-10-15 ENCOUNTER — Telehealth (HOSPITAL_COMMUNITY): Payer: Self-pay

## 2024-10-15 NOTE — Telephone Encounter (Signed)
 Called to confirm/remind patient of their appointment at the [facility] on 10/16/24 11:00.   Patient states he ash another appointment at 10:00 he will try to make it.  Appointment:   [] Confirmed  [] Left mess   [] No answer/No voice mail  [] VM Full/unable to leave message  [] Phone not in service  Patient reminded to bring all medications and/or complete list.  Confirmed patient has transportation. Gave directions, instructed to utilize valet parking.

## 2024-10-16 ENCOUNTER — Ambulatory Visit (HOSPITAL_COMMUNITY): Payer: Self-pay | Admitting: Cardiology

## 2024-10-16 ENCOUNTER — Ambulatory Visit (INDEPENDENT_AMBULATORY_CARE_PROVIDER_SITE_OTHER)

## 2024-10-16 ENCOUNTER — Ambulatory Visit: Admitting: Primary Care

## 2024-10-16 ENCOUNTER — Ambulatory Visit (HOSPITAL_COMMUNITY)
Admission: RE | Admit: 2024-10-16 | Discharge: 2024-10-16 | Disposition: A | Discharge status: Home / Self Care | Source: Ambulatory Visit | Attending: Cardiology | Admitting: Cardiology

## 2024-10-16 ENCOUNTER — Encounter (HOSPITAL_COMMUNITY): Payer: Self-pay

## 2024-10-16 VITALS — BP 90/62 | HR 69 | Ht 68.0 in | Wt 215.0 lb

## 2024-10-16 DIAGNOSIS — I340 Nonrheumatic mitral (valve) insufficiency: Secondary | ICD-10-CM | POA: Diagnosis not present

## 2024-10-16 DIAGNOSIS — I5022 Chronic systolic (congestive) heart failure: Secondary | ICD-10-CM | POA: Insufficient documentation

## 2024-10-16 DIAGNOSIS — F17210 Nicotine dependence, cigarettes, uncomplicated: Secondary | ICD-10-CM | POA: Insufficient documentation

## 2024-10-16 DIAGNOSIS — Z79899 Other long term (current) drug therapy: Secondary | ICD-10-CM | POA: Insufficient documentation

## 2024-10-16 DIAGNOSIS — Z7982 Long term (current) use of aspirin: Secondary | ICD-10-CM | POA: Insufficient documentation

## 2024-10-16 DIAGNOSIS — I428 Other cardiomyopathies: Secondary | ICD-10-CM | POA: Insufficient documentation

## 2024-10-16 DIAGNOSIS — Z95810 Presence of automatic (implantable) cardiac defibrillator: Secondary | ICD-10-CM | POA: Diagnosis not present

## 2024-10-16 DIAGNOSIS — Z8249 Family history of ischemic heart disease and other diseases of the circulatory system: Secondary | ICD-10-CM | POA: Diagnosis not present

## 2024-10-16 DIAGNOSIS — Z7984 Long term (current) use of oral hypoglycemic drugs: Secondary | ICD-10-CM | POA: Insufficient documentation

## 2024-10-16 DIAGNOSIS — I471 Supraventricular tachycardia, unspecified: Secondary | ICD-10-CM | POA: Insufficient documentation

## 2024-10-16 DIAGNOSIS — G4733 Obstructive sleep apnea (adult) (pediatric): Secondary | ICD-10-CM

## 2024-10-16 DIAGNOSIS — G4731 Primary central sleep apnea: Secondary | ICD-10-CM | POA: Diagnosis not present

## 2024-10-16 LAB — BASIC METABOLIC PANEL WITH GFR
Anion gap: 10 (ref 5–15)
BUN: 25 mg/dL — ABNORMAL HIGH (ref 8–23)
CO2: 27 mmol/L (ref 22–32)
Calcium: 9.2 mg/dL (ref 8.9–10.3)
Chloride: 102 mmol/L (ref 98–111)
Creatinine, Ser: 1.32 mg/dL — ABNORMAL HIGH (ref 0.61–1.24)
GFR, Estimated: >60 mL/min (ref >60)
Glucose, Bld: 85 mg/dL (ref 70–99)
Potassium: 4.2 mmol/L (ref 3.5–5.1)
Sodium: 139 mmol/L (ref 135–145)

## 2024-10-16 NOTE — Patient Instructions (Signed)
 There has been no changes to your medications.  Labs done today, your results will be available in MyChart, we will contact you for abnormal readings.  Your physician has requested that you have an echocardiogram. Echocardiography is a painless test that uses sound waves to create images of your heart. It provides your doctor with information about the size and shape of your heart and how well your heart's chambers and valves are working. This procedure takes approximately one hour. There are no restrictions for this procedure. Please do NOT wear cologne, perfume, aftershave, or lotions (deodorant is allowed). Please arrive 15 minutes prior to your appointment time.  Please note: We ask at that you not bring children with you during ultrasound (echo/ vascular) testing. Due to room size and safety concerns, children are not allowed in the ultrasound rooms during exams. Our front office staff cannot provide observation of children in our lobby area while testing is being conducted. An adult accompanying a patient to their appointment will only be allowed in the ultrasound room at the discretion of the ultrasound technician under special circumstances. We apologize for any inconvenience.  Your physician recommends that you schedule a follow-up appointment in: 3 months with an echocardiogram ( February 2026) ** PLEASE CALL THE OFFICE IN DECEMBER TO ARRANGE YOUR FOLLOW UP APPOINTMENT.**  If you have any questions or concerns before your next appointment please send us  a message through [place] or call our office at [phone].    TO LEAVE A MESSAGE FOR THE NURSE SELECT OPTION 2, PLEASE LEAVE A MESSAGE INCLUDING: YOUR NAME DATE OF BIRTH CALL BACK NUMBER REASON FOR CALL**this is important as we prioritize the call backs  YOU WILL RECEIVE A CALL BACK THE SAME DAY AS LONG AS YOU CALL BEFORE 4:00 PM  At the [facility], you and your health needs are our priority. As part of our  continuing mission to provide you with exceptional heart care, we have created designated Provider Care Teams. These Care Teams include your primary Cardiologist (physician) and Advanced Practice Providers (APPs- Physician Assistants and Nurse Practitioners) who all work together to provide you with the care you need, when you need it.   You may see any of the following providers on your designated Care Team at your next follow up: Dr Toribio Fuel Dr Ezra Shuck Dr. Morene Brownie Greig Mosses, NP Caffie Shed, [place] [facility] [place], [place] Beckey Coe, NP Jordan Lee, NP Ellouise Class, NP Tinnie Redman, PharmD Jaun Bash, PharmD   Please be sure to bring in all your medications bottles to every appointment.    Thank you for choosing [facility]

## 2024-10-16 NOTE — Progress Notes (Signed)
 [facility] Progress Note   Cardiology: Dr. Fernande HF Cardiology: Dr. Rolan  Reason for visit: f/u for chronic systolic heart failure   63 y.o. with history of nonischemic cardiomyopathy with a long history of cardiomyopathy.  He got a [employer] CRT-D device in 2005.  Echo in 7/18 showed EF 20-25% with diffuse hypokinesis and dilated RV.  He has a strong family history of CHF (father, sister, half-brother).  Sleep study in 12/18 with severe central sleep apnea, now on Bipap.  Echo in 8/19 showed EF 20-25%, moderate MR, mildly decreased RV systolic function.    Echo in 11/20 showed EF 25-30%, mildly decreased RV systolic function.  Echo in 3/22 showed EF 25-30%, diffuse hypokinesis, mildly decreased RV systolic function.   Echo in 5/23 showed EF 30-35%, diffuse hypokinesis, mildly decreased RV systolic function.  Echo 6/24 EF 20-25%, diffuse hypokinesis, moderate LV dilation, mildly decreased RV systolic function, moderate MR.   Today he returns for HF follow up. Reports feeling ok. Denies any change in symptoms/functional status since last visit. 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 Reports stable NYHA Class II symptoms. No wt gain. No LEE, orthopnea, PND, CP, palpitations, dizziness, syncope, near syncope. Reports full med compliance. BP soft 90/60 but he reports this is his baseline.   Noncompliant w/ BiPAP. Difficulty tolerating face mask.   Device interrogation today shows normal impedence. HL score 5. No AT/AF. No VT, 98% LV paced. Mean HR 68 bpm, activity level 2.1 hr/day (personally reviewed)    ECG not performed today     Labs (2/24): K 3.8, creatinine 1.22, BNP 118, hgb 16.1 Labs (6/24): K 4.0, creatinine 1.15 Labs (12/24): LDL 101 Labs (4/25): K 4.5, creatinine 1.23, LDL 46 Labs (10/25): K 4.3, creatinine 1.84, LDL 46   PMH: 1. Central sleep apnea: Uses Bipap.  2. LBBB: s/p [employer] CRT-D.   3. He is on sotalol  due to high DFT.  4.  History of atrial tachycardia 5. Active smoker 6. Chronic systolic CHF: nonischemic cardiomyopathy.  Known since prior to 2005.  [employer] CRT-D.  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  - Echo (7/18): EF 20-25%, severe dilated LV, diffuse hypokinesis, severe RV dilation with low normal systolic function, moderate TR, PASP 52 mmHg.  - Gynecomastia with spironolactone .  - Echo (8/19): EF 20-25%, moderate MR, mildly decreased RV systolic function.  - CPX (9/19): peak VO2 14.4, VE/VCO2 slope 40, RER 1.08.  Moderate functional limitation due to HF and body habitus.  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  - Echo (11/20): EF 25-30%, mildly decreased RV systolic function.  - Echo (3/22): EF 25-30%, diffuse hypokinesis, mildly decreased RV systolic function.  - Echo (5/23): EF 30-35%, diffuse hypokinesis, mildly decreased RV systolic function. - Echo (6/24): EF 20-25%, diffuse hypokinesis, moderate LV dilation, mildly decreased RV systolic function, moderate MR.   SH: Smokes 1/2 ppd, rare ETOH, occasional marijuana.   Lives with friend in [place].   FH: Father with cardiomyopathy, 1/2 brother died from CHF, sister with CHF.   ROS: All systems reviewed and negative except as per HPI.   Current Outpatient Medications  Medication Sig Dispense Refill   aspirin 81 MG tablet Take 81 mg by mouth daily.     atorvastatin  (LIPITOR) 40 MG tablet Take 1 tablet (40 mg total) by mouth daily. 90 tablet 1   carvedilol  (COREG ) 25 MG tablet Take 1 tablet (25 mg total) by mouth 2 (two) times daily with a  meal. 180 tablet 3   cetirizine (ZYRTEC) 10 MG chewable tablet Chew 10 mg by mouth daily.     dapagliflozin  propanediol (FARXIGA ) 10 MG TABS tablet TAKE 1 TABLET BY MOUTH EVERY DAY WITH BREAKFAST 90 tablet 3   eplerenone  (INSPRA ) 50 MG tablet TAKE 1 TABLET(50 MG) BY MOUTH DAILY 30 tablet 6   furosemide  (LASIX ) 40 MG tablet Take 1 tablet (40 mg total) by mouth every morning AND 0.5  tablets (20 mg total) every evening. 135 tablet 3   gabapentin (NEURONTIN) 100 MG capsule Take 1 capsule (100 mg total) by mouth 2 (two) times daily. 60 capsule 1   mometasone  (NASONEX ) 50 MCG/ACT nasal spray Place 2 sprays into the nose as needed.     naproxen (NAPROSYN) 500 MG tablet Take 1 tablet (500 mg total) by mouth 2 (two) times daily with a meal. 30 tablet 3   potassium chloride  SA (KLOR-CON  M) 20 MEQ tablet TAKE 1 TABLET(20 MEQ) BY MOUTH DAILY 90 tablet 3   sacubitril -valsartan  (ENTRESTO ) 97-103 MG Take 1 tablet by mouth 2 (two) times daily. 180 tablet 3   sotalol  (BETAPACE ) 80 MG tablet Take 1 tablet (80 mg total) by mouth 2 (two) times daily. 180 tablet 3   No current facility-administered medications for this encounter.   Wt Readings from Last 3 Encounters:  10/16/24 97.5 kg (215 lb)  09/26/24 98 kg (216 lb)  09/11/24 96.6 kg (213 lb)   BP 90/62   Pulse 69   Ht 5' 8 (1.727 m)   Wt 97.5 kg (215 lb)   SpO2 96%   BMI 32.69 kg/m   Physical Exam  GENERAL: NAD Lungs- clear  CARDIAC:  JVP not elevated           Normal rate with regular rhythm. No MRG. No LEE  ABDOMEN: Soft, non-tender, non-distended.  EXTREMITIES: Warm and well perfused.  NEUROLOGIC: No obvious FND   Assessment/Plan: 1. Chronic systolic CHF: Nonischemic cardiomyopathy x years.  [employer] CRT-D device.  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  Echo in 8/19 with EF 20-25%, echo in 11/20 showed EF 25-30%, echo in 5/23 showed EF 30-35%, echo 6/24 showed EF 20-25% with mild RV dysfunction.  CPX in 9/19 showed moderate functional limitation from heart failure.  He saw Dr. Fairy for genetic counseling, and genetic testing returned with 2 variants of uncertain significance.  He reports stable NYHA Class I-II symptoms. Euvolemic on exam and on device interrogation, HL Score 5. No VT. BP soft but no orthostatic symptoms.  - Continue Lasix  40 qam/20 qpm + 20 KCL daily. Check BMP and  BNP today  - Continue dapagliflozin  10 mg daily. Denies GU symptoms  - Continue Entresto  97/103 bid.  - Continue Coreg  25 mg bid.  - Continue eplerenone  50 mg daily. Intolerant spiro due to gynecomastia.     - BP too low to add Bidil .   - If he becomes more symptomatic in the future, consider barostimulation activation therapy.  - update echo next visit  2. Smoking: Discussed smoking cessation again.   - Bupropion  did not help. He would like to try to quit on his own. 3. Atrial Tachycardia: He is on sotalol . No AT/AF detected on device interrogation  4. Central sleep apnea: poor compliance w/ BiPAP. Encouraged to improve  - Refer back to Pulm to discuss mask option.  Follow up in 3-4 months with Dr. Rolan + echo  Caffie Shed PA-C  10/16/2024

## 2024-10-16 NOTE — Progress Notes (Deleted)
 Office Note     CC:  Concern for claudication  Requesting Provider:  Celestia Rosaline SQUIBB, NP  HPI: Spencer Robertson is a 63 y.o. ([birth year]) male presenting at the request of .Celestia Rosaline SQUIBB, NP ***  The pt is *** on a statin for cholesterol management.  The pt is *** on a daily aspirin.   Other AC:  *** The pt is *** on medication for hypertension.   The pt is *** diabetic.  Tobacco hx:  ***  Past Medical History:  Diagnosis Date   CHF (congestive heart failure) (HCC)    Hyperlipidemia    Hypertension    Mitral regurgitation    Nonischemic cardiomyopathy (HCC)    Obesity    Obesity (BMI 30-39.9) 09/01/2015   OSA (obstructive sleep apnea)    severe   S/P implantation of automatic cardioverter/defibrillator (AICD)    Guidant Contact H177   Tobacco abuse     Past Surgical History:  Procedure Laterality Date   BIV ICD GENERATOR CHANGEOUT N/A 10/06/2020   Procedure: BIV ICD GENERATOR CHANGEOUT;  Surgeon: Fernande Elspeth BROCKS, MD;  Location: [facility] INVASIVE CV LAB;  Service: Cardiovascular;  Laterality: N/A;   CARDIAC DEFIBRILLATOR PLACEMENT     Guidant Contak H177 device     Social History   Socioeconomic History   Marital status: Single    Spouse name: Not on file   Number of children: Not on file   Years of education: Not on file   Highest education level: Not on file  Occupational History   Occupation: disabled   Occupation: work in emergency planning/management officer  Tobacco Use   Smoking status: Every Day    Current packs/day: 0.25    Average packs/day: 0.3 packs/day for 20.0 years (5.0 ttl pk-yrs)    Types: Cigarettes   Smokeless tobacco: Never   Tobacco comments:    2 packs per week 07/17/2021  Vaping Use   Vaping status: Never Used  Substance and Sexual Activity   Alcohol use: Yes    Comment: socially   Drug use: Yes    Types: Marijuana    Comment: occasional   Sexual activity: Not on file  Other Topics Concern   Not on file  Social History Narrative   Not on file    Social Drivers of Health   Financial Resource Strain: Not on file  Food Insecurity: Not on file  Transportation Needs: Not on file  Physical Activity: Not on file  Stress: Not on file  Social Connections: Not on file  Intimate Partner Violence: Not on file   *** Family History  Problem Relation Age of Onset   Cardiomyopathy Father    Hypertension Other    Heart disease Sister    Heart disease Brother     Current Outpatient Medications  Medication Sig Dispense Refill   aspirin 81 MG tablet Take 81 mg by mouth daily.     atorvastatin  (LIPITOR) 40 MG tablet Take 1 tablet (40 mg total) by mouth daily. 90 tablet 1   carvedilol  (COREG ) 25 MG tablet Take 1 tablet (25 mg total) by mouth 2 (two) times daily with a meal. 180 tablet 3   cetirizine (ZYRTEC) 10 MG chewable tablet Chew 10 mg by mouth daily.     dapagliflozin  propanediol (FARXIGA ) 10 MG TABS tablet TAKE 1 TABLET BY MOUTH EVERY DAY WITH BREAKFAST 90 tablet 3   eplerenone  (INSPRA ) 50 MG tablet TAKE 1 TABLET(50 MG) BY MOUTH DAILY 30 tablet 6   furosemide  (  LASIX ) 40 MG tablet Take 1 tablet (40 mg total) by mouth every morning AND 0.5 tablets (20 mg total) every evening. 135 tablet 3   gabapentin (NEURONTIN) 100 MG capsule Take 1 capsule (100 mg total) by mouth 2 (two) times daily. 60 capsule 1   mometasone  (NASONEX ) 50 MCG/ACT nasal spray Place 2 sprays into the nose as needed.     naproxen (NAPROSYN) 500 MG tablet Take 1 tablet (500 mg total) by mouth 2 (two) times daily with a meal. 30 tablet 3   potassium chloride  SA (KLOR-CON  M) 20 MEQ tablet TAKE 1 TABLET(20 MEQ) BY MOUTH DAILY 90 tablet 3   sacubitril -valsartan  (ENTRESTO ) 97-103 MG Take 1 tablet by mouth 2 (two) times daily. 180 tablet 3   sotalol  (BETAPACE ) 80 MG tablet Take 1 tablet (80 mg total) by mouth 2 (two) times daily. 180 tablet 3   No current facility-administered medications for this visit.    Allergies  Allergen Reactions   Spironolactone  Other (See  Comments)    gynecomastia     REVIEW OF SYSTEMS:  *** [X]  denotes positive finding, [ ]  denotes negative finding Cardiac  Comments:  Chest pain or chest pressure:    Shortness of breath upon exertion:    Short of breath when lying flat:    Irregular heart rhythm:        Vascular    Pain in calf, thigh, or hip brought on by ambulation:    Pain in feet at night that wakes you up from your sleep:     Blood clot in your veins:    Leg swelling:         Pulmonary    Oxygen at home:    Productive cough:     Wheezing:         Neurologic    Sudden weakness in arms or legs:     Sudden numbness in arms or legs:     Sudden onset of difficulty speaking or slurred speech:    Temporary loss of vision in one eye:     Problems with dizziness:         Gastrointestinal    Blood in stool:     Vomited blood:         Genitourinary    Burning when urinating:     Blood in urine:        Psychiatric    Major depression:         Hematologic    Bleeding problems:    Problems with blood clotting too easily:        Skin    Rashes or ulcers:        Constitutional    Fever or chills:      PHYSICAL EXAMINATION:  There were no vitals filed for this visit.  General:  WDWN in NAD; vital signs documented above Gait: Not observed HENT: WNL, normocephalic Pulmonary: normal non-labored breathing , without wheezing Cardiac: {Desc; regular/irreg:14544} HR Abdomen: soft, NT, no masses Skin: {With/Without:20273} rashes Vascular Exam/Pulses:  Right Left  Radial {Exam; arterial pulse strength 0-4:30167} {Exam; arterial pulse strength 0-4:30167}  Ulnar {Exam; arterial pulse strength 0-4:30167} {Exam; arterial pulse strength 0-4:30167}  Femoral {Exam; arterial pulse strength 0-4:30167} {Exam; arterial pulse strength 0-4:30167}  Popliteal {Exam; arterial pulse strength 0-4:30167} {Exam; arterial pulse strength 0-4:30167}  DP {Exam; arterial pulse strength 0-4:30167} {Exam; arterial pulse  strength 0-4:30167}  PT {Exam; arterial pulse strength 0-4:30167} {Exam; arterial pulse strength 0-4:30167}   Extremities: {With/Without:20273} ischemic  changes, {With/Without:20273} Gangrene , {With/Without:20273} cellulitis; {With/Without:20273} open wounds;  Musculoskeletal: no muscle wasting or atrophy  Neurologic: A&O X 3;  No focal weakness or paresthesias are detected Psychiatric:  The pt has {Desc; normal/abnormal:11317::Normal} affect.   Non-Invasive Vascular Imaging:   ***    ASSESSMENT/PLAN: Spencer Robertson is a 63 y.o. male presenting with ***   ***   Fonda FORBES Rim, MD [facility] [phone]

## 2024-10-17 ENCOUNTER — Ambulatory Visit: Payer: Self-pay | Admitting: Cardiovascular Disease

## 2024-10-17 LAB — CUP PACEART REMOTE DEVICE CHECK
Battery Remaining Longevity: 84 mo
Battery Remaining Percentage: 93 %
Brady Statistic RA Percent Paced: 28 %
Brady Statistic RV Percent Paced: 97 %
Date Time Interrogation Session: 20251111112800
HighPow Impedance: 48 Ohm
Implantable Lead Connection Status: 753985
Implantable Lead Connection Status: 753985
Implantable Lead Connection Status: 753985
Implantable Lead Implant Date: 20051122
Implantable Lead Implant Date: 20051122
Implantable Lead Implant Date: 20051122
Implantable Lead Location: 753858
Implantable Lead Location: 753859
Implantable Lead Location: 753860
Implantable Lead Model: 158
Implantable Lead Model: 4194
Implantable Lead Model: 5076
Implantable Lead Serial Number: 159458
Implantable Pulse Generator Implant Date: 20211101
Lead Channel Impedance Value: 463 Ohm
Lead Channel Impedance Value: 557 Ohm
Lead Channel Impedance Value: 862 Ohm
Lead Channel Setting Pacing Amplitude: 2 V
Lead Channel Setting Pacing Amplitude: 2.4 V
Lead Channel Setting Pacing Amplitude: 2.8 V
Lead Channel Setting Pacing Pulse Width: 0.4 ms
Lead Channel Setting Pacing Pulse Width: 1 ms
Lead Channel Setting Sensing Sensitivity: 0.5 mV
Lead Channel Setting Sensing Sensitivity: 1 mV
Pulse Gen Serial Number: 147594

## 2024-10-18 ENCOUNTER — Ambulatory Visit: Admitting: Vascular Surgery

## 2024-10-18 ENCOUNTER — Ambulatory Visit (HOSPITAL_COMMUNITY): Attending: Vascular Surgery

## 2024-10-19 NOTE — Progress Notes (Signed)
 Remote ICD Transmission

## 2024-11-06 ENCOUNTER — Other Ambulatory Visit (HOSPITAL_COMMUNITY): Payer: Self-pay

## 2024-11-06 DIAGNOSIS — I5022 Chronic systolic (congestive) heart failure: Secondary | ICD-10-CM

## 2024-11-06 MED ORDER — SACUBITRIL-VALSARTAN 97-103 MG PO TABS: 1.0000 | ORAL_TABLET | Freq: Two times a day (BID) | ORAL | 3 refills | Status: AC

## 2024-11-19 ENCOUNTER — Ambulatory Visit (HOSPITAL_COMMUNITY): Attending: Vascular Surgery

## 2024-11-19 ENCOUNTER — Ambulatory Visit: Admitting: Surgery

## 2024-11-20 ENCOUNTER — Telehealth: Payer: Self-pay

## 2024-11-20 NOTE — Telephone Encounter (Signed)
 Pt is scheduled to see Landry Camps, NP tomorrow for a new pt. I also could not find a DL for pt's cpap machine.   ATC pt x1. Lmtcb.   If pt is using cpap machine, then I need him to bring in his SD card or machine if he is unsure if he has one.   If not, does he still use adapt?

## 2024-11-21 ENCOUNTER — Ambulatory Visit: Admitting: Primary Care

## 2024-11-21 DIAGNOSIS — G4733 Obstructive sleep apnea (adult) (pediatric): Secondary | ICD-10-CM

## 2024-11-26 ENCOUNTER — Other Ambulatory Visit (HOSPITAL_COMMUNITY): Payer: Self-pay | Admitting: Cardiology

## 2024-11-26 DIAGNOSIS — I5022 Chronic systolic (congestive) heart failure: Secondary | ICD-10-CM

## 2024-12-17 ENCOUNTER — Other Ambulatory Visit: Payer: Self-pay | Admitting: Orthopaedic Surgery

## 2024-12-18 NOTE — Telephone Encounter (Signed)
 Tried to call. No answer. LMOM with all this information.

## 2024-12-18 NOTE — Telephone Encounter (Signed)
 Defer to pcp due to renal function

## 2025-01-02 ENCOUNTER — Other Ambulatory Visit (INDEPENDENT_AMBULATORY_CARE_PROVIDER_SITE_OTHER): Payer: Self-pay | Admitting: Primary Care

## 2025-01-03 NOTE — Telephone Encounter (Signed)
 Requested Prescriptions  Pending Prescriptions Disp Refills   atorvastatin  (LIPITOR) 40 MG tablet [Pharmacy Med Name: ATORVASTATIN  40MG  TABLETS] 90 tablet 1    Sig: TAKE 1 TABLET(40 MG) BY MOUTH DAILY     Cardiovascular:  Antilipid - Statins Failed - 01/03/2025  3:17 PM      Failed - Lipid Panel in normal range within the last 12 months    Cholesterol, Total  Date Value Ref Range Status  09/26/2024 99 (L) 100 - 199 mg/dL Final   LDL Chol Calc (NIH)  Date Value Ref Range Status  09/26/2024 46 0 - 99 mg/dL Final   HDL  Date Value Ref Range Status  09/26/2024 38 (L) >39 mg/dL Final   Triglycerides  Date Value Ref Range Status  09/26/2024 69 0 - 149 mg/dL Final         Passed - Patient is not pregnant      Passed - Valid encounter within last 12 months    Recent Outpatient Visits           3 months ago Prediabetes   [place] [facility] Celestia Rosaline SQUIBB, NP   9 months ago Prediabetes   [place] [facility] Celestia Rosaline SQUIBB, NP   1 year ago Prostate cancer screening   [place] [facility] Celestia Rosaline SQUIBB, NP   1 year ago Prediabetes   [place] [facility] Celestia Rosaline SQUIBB, NP   2 years ago Prediabetes   [place] [facility] Celestia Rosaline SQUIBB, NP

## 2025-01-09 ENCOUNTER — Ambulatory Visit: Admitting: Vascular Surgery

## 2025-01-09 ENCOUNTER — Ambulatory Visit (HOSPITAL_COMMUNITY)

## 2025-01-15 ENCOUNTER — Ambulatory Visit

## 2025-02-13 ENCOUNTER — Ambulatory Visit (HOSPITAL_COMMUNITY)

## 2025-02-13 ENCOUNTER — Encounter: Admitting: Vascular Surgery

## 2025-03-27 ENCOUNTER — Ambulatory Visit (INDEPENDENT_AMBULATORY_CARE_PROVIDER_SITE_OTHER): Admitting: Primary Care

## 2025-04-16 ENCOUNTER — Ambulatory Visit

## 2025-07-16 ENCOUNTER — Ambulatory Visit

## 2025-10-15 ENCOUNTER — Ambulatory Visit
